# Patient Record
Sex: Female | Born: 1960 | Race: Black or African American | Hispanic: No | Marital: Single | Smoking: Current every day smoker
Health system: Southern US, Community
[De-identification: ages and names within clinical notes are randomized; demographics above are authoritative.]

## PROBLEM LIST (undated history)

## (undated) ENCOUNTER — Emergency Department (HOSPITAL_COMMUNITY): Payer: Medicare HMO

## (undated) DIAGNOSIS — G4730 Sleep apnea, unspecified: Secondary | ICD-10-CM

## (undated) DIAGNOSIS — Z973 Presence of spectacles and contact lenses: Secondary | ICD-10-CM

## (undated) DIAGNOSIS — H409 Unspecified glaucoma: Secondary | ICD-10-CM

## (undated) DIAGNOSIS — E785 Hyperlipidemia, unspecified: Secondary | ICD-10-CM

## (undated) DIAGNOSIS — H269 Unspecified cataract: Secondary | ICD-10-CM

## (undated) DIAGNOSIS — E890 Postprocedural hypothyroidism: Secondary | ICD-10-CM

## (undated) DIAGNOSIS — G4733 Obstructive sleep apnea (adult) (pediatric): Secondary | ICD-10-CM

## (undated) DIAGNOSIS — F419 Anxiety disorder, unspecified: Secondary | ICD-10-CM

## (undated) DIAGNOSIS — D494 Neoplasm of unspecified behavior of bladder: Secondary | ICD-10-CM

## (undated) DIAGNOSIS — M1990 Unspecified osteoarthritis, unspecified site: Secondary | ICD-10-CM

## (undated) DIAGNOSIS — E119 Type 2 diabetes mellitus without complications: Secondary | ICD-10-CM

## (undated) DIAGNOSIS — Z972 Presence of dental prosthetic device (complete) (partial): Secondary | ICD-10-CM

## (undated) DIAGNOSIS — F1911 Other psychoactive substance abuse, in remission: Secondary | ICD-10-CM

## (undated) DIAGNOSIS — C569 Malignant neoplasm of unspecified ovary: Secondary | ICD-10-CM

## (undated) DIAGNOSIS — F333 Major depressive disorder, recurrent, severe with psychotic symptoms: Secondary | ICD-10-CM

## (undated) HISTORY — DX: Sleep apnea, unspecified: G47.30

## (undated) HISTORY — DX: Unspecified cataract: H26.9

## (undated) HISTORY — DX: Anxiety disorder, unspecified: F41.9

## (undated) HISTORY — DX: Unspecified osteoarthritis, unspecified site: M19.90

## (undated) HISTORY — DX: Hyperlipidemia, unspecified: E78.5

## (undated) HISTORY — PX: STRABISMUS SURGERY: SHX218

---

## 2002-03-04 HISTORY — PX: THYROIDECTOMY: SHX17

## 2014-02-16 ENCOUNTER — Emergency Department (HOSPITAL_COMMUNITY): Payer: Medicaid Other

## 2014-02-16 ENCOUNTER — Emergency Department (HOSPITAL_COMMUNITY)
Admission: EM | Admit: 2014-02-16 | Discharge: 2014-02-16 | Disposition: A | Discharge status: Home / Self Care | Payer: Medicaid Other | Attending: Emergency Medicine | Admitting: Emergency Medicine

## 2014-02-16 ENCOUNTER — Encounter (HOSPITAL_COMMUNITY): Payer: Self-pay | Admitting: Family Medicine

## 2014-02-16 DIAGNOSIS — W010XXA Fall on same level from slipping, tripping and stumbling without subsequent striking against object, initial encounter: Secondary | ICD-10-CM | POA: Insufficient documentation

## 2014-02-16 DIAGNOSIS — S8391XA Sprain of unspecified site of right knee, initial encounter: Secondary | ICD-10-CM | POA: Insufficient documentation

## 2014-02-16 DIAGNOSIS — S8991XA Unspecified injury of right lower leg, initial encounter: Secondary | ICD-10-CM | POA: Diagnosis present

## 2014-02-16 DIAGNOSIS — M179 Osteoarthritis of knee, unspecified: Secondary | ICD-10-CM | POA: Insufficient documentation

## 2014-02-16 DIAGNOSIS — S39012A Strain of muscle, fascia and tendon of lower back, initial encounter: Secondary | ICD-10-CM | POA: Insufficient documentation

## 2014-02-16 DIAGNOSIS — M1990 Unspecified osteoarthritis, unspecified site: Secondary | ICD-10-CM

## 2014-02-16 DIAGNOSIS — Y9389 Activity, other specified: Secondary | ICD-10-CM | POA: Insufficient documentation

## 2014-02-16 DIAGNOSIS — Z720 Tobacco use: Secondary | ICD-10-CM | POA: Diagnosis not present

## 2014-02-16 DIAGNOSIS — Y990 Civilian activity done for income or pay: Secondary | ICD-10-CM | POA: Insufficient documentation

## 2014-02-16 DIAGNOSIS — W19XXXA Unspecified fall, initial encounter: Secondary | ICD-10-CM

## 2014-02-16 DIAGNOSIS — Y9289 Other specified places as the place of occurrence of the external cause: Secondary | ICD-10-CM | POA: Diagnosis not present

## 2014-02-16 MED ORDER — HYDROCODONE-ACETAMINOPHEN 5-325 MG PO TABS: ORAL_TABLET | ORAL | Status: DC

## 2014-02-16 NOTE — ED Provider Notes (Signed)
CSN: [id number]     Arrival date & time 02/16/14  1438 History  This chart was scribed for non-physician practitioner, Monico Blitz, PA-C, working with Quintella Reichert, MD, by Delphia Grates, ED Scribe. This patient was seen in room TR07C/TR07C and the patient's care was started at 4:22 PM.   Chief Complaint  Patient presents with  . Fall    The history is provided by the patient. No language interpreter was used.     HPI Comments: Raven [name] is a 53 y.o. female, with no significant medical history, who presents to the Emergency Department complaining of a fall that occurred last night. Patient states she was at work when she slipped ona piece of ice, injuring her right knee and right lower back area. She reports she felt a pop in her right knee. There is associated 7/10 right knee pain and right lower back pain. She states that she is able to ambulate, however, would like X-rays to rule out possible fracture or damage. Patient has taken one 200mg  ibuprofen every 6 hours and has been alternating ice and heat. Pt denies head trauma, LOC, N/V, change in vision, cervicalgia, chest pain, SOB, abdominal pain, difficulty ambulating, numbness, weakness, difficulty moving major joints, EtOH/illicit drug/perscription drug use that would alter awareness.    History reviewed. No pertinent past medical history. History reviewed. No pertinent past surgical history. History reviewed. No pertinent family history. History  Substance Use Topics  . Smoking status: Current Every Day Smoker  . Smokeless tobacco: Not on file  . Alcohol Use: No   OB History    No data available     Review of Systems   A complete 10 system review of systems was obtained and all systems are negative except as noted in the HPI and PMH.     Allergies  Review of patient's allergies indicates no known allergies.  Home Medications   Prior to Admission medications   Medication Sig Start Date End Date Taking?  Authorizing Provider  HYDROcodone-acetaminophen (NORCO/VICODIN) 5-325 MG per tablet Take 1-2 tablets by mouth every 6 hours as needed for pain and/or cough. 02/16/14   Jonita Hirota, PA-C   Triage Vitals: BP 142/70 mmHg  Pulse 87  Temp(Src) 98.3 F (36.8 C) (Oral)  Resp 16  Ht 5\' 8"  (1.727 m)  Wt 260 lb (117.935 kg)  BMI 39.54 kg/m2  SpO2 96%  Physical Exam  Constitutional: She is oriented to person, place, and time. She appears well-developed and well-nourished. No distress.  HENT:  Head: Normocephalic and atraumatic.  Mouth/Throat: Oropharynx is clear and moist.  Eyes: Conjunctivae and EOM are normal. Pupils are equal, round, and reactive to light.  Neck: Normal range of motion. Neck supple. No tracheal deviation present.  No midline C-spine  tenderness to palpation or step-offs appreciated. Patient has full range of motion without pain.   Cardiovascular: Normal rate, regular rhythm and intact distal pulses.   Pulmonary/Chest: Effort normal and breath sounds normal. No respiratory distress. She has no wheezes. She has no rales. She exhibits no tenderness.  Abdominal: Soft.  Musculoskeletal: Normal range of motion.       Back:  Right  knee:  No deformity, erythema or abrasions. FROM. No effusion, ++crepitance. Anterior and posterior drawer show no abnormal laxity. Stable to valgus and varus stress. Joint lines are non-tender. Neurovascularly intact. Pt ambulates with non-antalgic gait.   Right flank with no ecchymoses, lung sounds clear to auscultation, no crepitance. Patient is tender inferior to ribs.  Neurological: She is alert and oriented to person, place, and time.  Skin: Skin is warm and dry.  Psychiatric: She has a normal mood and affect. Her behavior is normal.  Nursing note and vitals reviewed.   ED Course  Procedures (including critical care time)  DIAGNOSTIC STUDIES: Oxygen Saturation is 96% on room air, adequate by my interpretation.    COORDINATION OF  CARE: At 1628 Discussed treatment plan with patient which includes imaging, Vicodin, increased dose of ibuprofen. Patient agrees. Also advised patient to utilize crutches, however patient declines.      Imaging Review Dg Knee Complete 4 Views Right  02/16/2014   CLINICAL DATA:  53 year old female with right anterior knee pain just inferior to the patella for the past 2 days. Recent history of fall.  EXAM: RIGHT KNEE - COMPLETE 4+ VIEW  COMPARISON:  No priors.  FINDINGS: Multiple views of the right knee demonstrate no acute displaced fracture, subluxation, dislocation, or soft tissue abnormality. Joint space narrowing, subchondral sclerosis and osteophyte formation, most evident in the medial and patellofemoral compartments. Mild chondrocalcinosis is noted in the lateral compartment.  IMPRESSION: 1.  No acute radiographic abnormality of the right knee. 2. 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 3. Chondrocalcinosis.   Electronically Signed   By: Vinnie Langton M.D.   On: 02/16/2014 17:00     MDM   Final diagnoses:  Fall  Right knee sprain, initial encounter  Lumbar strain, initial encounter  Arthritis    Filed Vitals:   02/16/14 1456  BP: 142/70  Pulse: 87  Temp: 98.3 F (36.8 C)  TempSrc: Oral  Resp: 16  Height: 5\' 8"  (1.727 m)  Weight: 260 lb (117.935 kg)  SpO2: 96%    Raven [name] is a pleasant 53 y.o. female presenting with right knee and right flank pain status post slip and fall yesterday with no head trauma. No focal bony tenderness palpation along the knee. X-ray shows a severe tricompartmental arthritis with no acute fracture. Offered patient crutches which she declined. Pain medication choices in the ED are limited because she is driving home. I've advised her to follow closely with orthopedics. Written her a work note for several days.  Evaluation does not show pathology that would require ongoing emergent  intervention or inpatient treatment. Pt is hemodynamically stable and mentating appropriately. Discussed findings and plan with patient/guardian, who agrees with care plan. All questions answered. Return precautions discussed and outpatient follow up given.   New Prescriptions   HYDROCODONE-ACETAMINOPHEN (NORCO/VICODIN) 5-325 MG PER TABLET    Take 1-2 tablets by mouth every 6 hours as needed for pain and/or cough.     I personally performed the services described in this documentation, which was scribed in my presence. The recorded information has been reviewed and is accurate.     Monico Blitz, PA-C 02/16/14 [place], MD 02/16/14 2033

## 2014-02-16 NOTE — Discharge Instructions (Signed)
Take vicodin for breakthrough pain, do not drink alcohol, drive, care for children or do other critical tasks while taking vicodin.  Do not hesitate to return to the emergency room for any new, worsening or concerning symptoms.  Please obtain primary care using resource guide below. But the minute you were seen in the emergency room and that they will need to obtain records for further outpatient management.   Emergency Department Resource Guide 1) Find a Doctor and Pay Out of Pocket Although you won't have to find out who is covered by your insurance plan, it is a good idea to ask around and get recommendations. You will then need to call the office and see if the doctor you have chosen will accept you as a new patient and what types of options they offer for patients who are self-pay. Some doctors offer discounts or will set up payment plans for their patients who do not have insurance, but you will need to ask so you aren't surprised when you get to your appointment.  2) Contact Your Local Health Department Not all health departments have doctors that can see patients for sick visits, but many do, so it is worth a call to see if yours does. If you don't know where your local health department is, you can check in your phone book. The CDC also has a tool to help you locate your state's health department, and many state websites also have listings of all of their local health departments.  3) Find a [facility] If your illness is not likely to be very severe or complicated, you may want to try a walk in clinic. These are popping up all over the country in pharmacies, drugstores, and shopping centers. They're usually staffed by nurse practitioners or physician assistants that have been trained to treat common illnesses and complaints. They're usually fairly quick and inexpensive. However, if you have serious medical issues or chronic medical problems, these are probably not your best option.  No  Primary Care Doctor: - Call Health Connect at  [phone] - they can help you locate a primary care doctor that  accepts your insurance, provides certain services, etc. - Physician Referral Service- [phone]  Chronic Pain Problems: Organization         Address  Phone   Notes  [facility]  [phone] Patients need to be referred by their primary care doctor.   Medication Assistance: Organization         Address  Phone   Notes  [facility] Medication [facility] [address] [place], [place] [address] [phone] --Must be a resident of [facility] -- Must have NO insurance coverage whatsoever (no Medicaid/ Medicare, etc.) -- The pt. MUST have a primary care doctor that directs their care regularly and follows them in the community   MedAssist  [phone]   [employer]  [phone]    Agencies that provide inexpensive medical care: Organization         Address  Phone   Notes  [place]  [phone]   [facility]    [phone]   [facility] [place],  [address] [phone]   Norris Kibler [phone]   Planned Parenthood    [phone]   [facility]    [phone]   Community Health and [facility]  [address], [place] Phone:  [phone], Fax:  [phone] Hours of Operation:  9 am - 6 pm, M-F.  Also accepts Medicaid/Medicare and self-pay.  [facility] for [address], [address], [place] Phone: [phone], Fax: [phone]. Hours of Operation:  8:30 am - 5:30 pm, M-F.  Also accepts Medicaid and self-pay.  [facility] [place] [address] [place] Phone: [phone]   [place], [place], [place] [phone], Ext. [phone] Mondays & Thursdays: 7-9 AM.  First 15 patients are seen on  a first come, first serve basis.    [place] Providers:  Organization         Address  Phone   Notes  [facility] [address], [place] [phone] Also accepts self-pay patients.  [facility] ([place]) [address], [place]  [phone]   [place], [address], [place] [phone]   [facility] [address] [place] [phone]   Lucianne Lei [address], [place]   [phone] Only accepts [place] Access [place] patients after they have their name applied to their card.   Self-Pay (no insurance) in [facility]:  Organization         Address  Phone   Notes  Sickle Cell Patients, [facility] Internal Medicine [place] [phone]   [facility] Urgent Care [place] [phone]   [facility] [place]  [place], [place], [place] [phone]   [facility]/Dr. Osei-Bonsu  [address], [place] or [address], [place] [phone] Phone number for both [place] and [place] locations is the same.  Urgent Medical and [facility] [address], [place] [phone]   [facility] [address], [place] or [address] [phone]   [facility] [address], [place] [phone], phone; [phone], fax Sees patients 1st and 3rd Saturday of every month.  Must not qualify for public or private insurance (i.e. Medicaid, Medicare, [place] Health Choice, Veterans' Benefits)  Household income should be no more than 200% of the poverty level The clinic cannot treat you if you are pregnant or think you are pregnant  Sexually transmitted diseases are not treated at the clinic.    Dental Care: Organization          Address  Phone  Notes  [facility] Department of [facility] [place] [phone] Accepts children up to age 63 who are enrolled in [place] or [place]; pregnant women with a Medicaid card; and children who have applied for Medicaid or [place] Health Choice, but were declined, whose parents can pay a reduced fee at time of service.  [facility] Department of [facility]  [address], [place] [phone] Accepts children up to age 72 who are enrolled in [place] or [place]; pregnant women with a Medicaid card; and children who have applied for Medicaid or  Health Choice, but were declined, whose parents can pay a reduced fee at time of service.  [facility] Adult Dental Access PROGRAM  [place], [place] [phone]  Patients are seen by appointment only. Walk-ins are not accepted. [place] will see patients 62 years of age and older. Monday - Tuesday (8am-5pm) Most Wednesdays (8:30-5pm) $30 per visit, cash only  [facility] Adult Dental Access PROGRAM  [address], [facility] [phone] Patients are seen by appointment only. Walk-ins are not accepted. [place] will see patients 76 years of age and older. One Wednesday Evening (Monthly: Volunteer Based).  $30 per visit, cash only  [place]  [phone] for adults; Children under age 48, call Graduate Pediatric Dentistry at [phone]. Children aged 49-14, please call [phone] to request a pediatric application.  Dental services are provided in all areas of dental care including fillings, crowns and bridges, complete and partial dentures, implants, gum treatment, root canals, and extractions. Preventive care is also provided. Treatment is provided to both adults and children. Patients are selected via a lottery and there is often a waiting list.   [facility] [address] [place]  [phone] [email or web address]   [facility] [address] [place], [place] [phone], Ext. [phone] Second and Fourth Thursday of each month, opens at 6:30 AM; Clinic ends at 9 AM.  Patients are seen on a first-come first-served basis, and a limited number are seen during each clinic.   [facility]  [address] Hillard Danker [place], [place] [phone]   Eligibility Requirements You must have lived in [place], [place], or [place] counties for at least the last three months.   You cannot be eligible for state or federal sponsored [employer], including [employer], [place], or [employer].   You generally cannot be eligible for healthcare insurance through your employer.    How to apply: Eligibility screenings are held every Tuesday and Wednesday afternoon from 1:00 pm until 4:00 pm. You do not need an appointment for the interview!  [facility] [address] [place], [place]   [place]  [place] Department  [place]  [phone]    Behavioral Health Resources in the Community: Intensive Outpatient Programs Organization         Address  Phone  Notes  [place]. [address] [place], [place] [phone]   [facility] Outpatient [address], [place], [place]   ADS: Alcohol & Drug Svcs [address] [place], [place]   [place] [address],  [place], [place] or [phone]   Substance Abuse Resources Organization         Address  Phone  Notes  Alcohol and Drug Services  [phone]   [place]  [phone]   [facility] [place]   Chinita Pester  [phone]   Residential & Outpatient Substance Abuse Program  [phone]   Psychological  Services Organization         Address  Phone  Notes  [facility] [place]  [place]  [phone]   [place] [address], [place] or [phone]    Mobile Crisis Teams Organization         Address  Phone  Notes  [facility]atives, Mobile Crisis Care Unit  [phone]   Assertive Psychotherapeutic Services  [address] [place], [place]   [facility] [address], [place]  [place] [phone]    Self-Help/Support Groups Organization         Address  Phone             Notes  [facility] [place] - variety of support groups  [place] Call for more information  Narcotics Anonymous (NA), [facility] [address], [employer] [place]  2 meetings at this location   Special educational needs teacher         Address  Phone  Notes  [facility] Residential Treatment [place],    [place]  1-2486002970   [facility]  [address] [place] [phone], [place], [place]   [place], [place] [phone] Admissions: 8am-3pm M-F  Incentives Substance [place] [address]    [place], [place] [phone]   [facility] [address] [place], [place], [place]   [facility] [place] [facility] [address],  [place], [place]   [facility] - Intensive Outpatient [address], [place], [place]   [facility] ([place] [address].,  [place], [place] [phone] or [phone]   [facility] Services (RTS) [address] [place], [place] Accepts Medicaid  [facility] [place] [address]  [place] [phone] Substance Abuse/Addiction Treatment   [facility] Organization         Address  Phone  Notes  [facility]  [phone]   Domenic Schwab, PhD [address] Arlis Porta [place], [place]   [phone] or [phone]   [facility] [place], [place] [phone]   [facility] [address] [place], [place] [phone] Insurance/Medicaid/sponsorship through [facility] and Families [address] [place]                                    [place], [place] [phone] [place] [address], [place] [phone]    Dr. Adele Schilder  [phone]   [facility] Dept. 1) [address] [place] 2) [place] 3)  [address] [address], [place] [phone]  [phone]   [place] [phone] or [phone] (After Hours)       Arthritis, Nonspecific Arthritis is pain, redness, warmth, or puffiness (inflammation) of a joint. The joint may be stiff or hurt when you move it. One or more joints may be affected. There are many types of arthritis. Your doctor may not know what type you have right away. The most common cause of arthritis is wear and tear on the joint (osteoarthritis). HOME CARE   Only take medicine as told by your doctor.  Rest the joint as much as possible.  Raise (elevate) your joint if it is puffy.  Use crutches if the painful joint is in your leg.  Drink enough fluids to keep your pee (urine) clear or pale yellow.  Follow your doctor's diet instructions.  Use cold packs for very bad joint pain for 10 to 15 minutes every hour. Ask your doctor if it is okay for you to use hot packs.  Exercise as told by your doctor.  Take a  warm shower if you have stiffness in the morning.  Move your sore joints throughout the day. GET HELP RIGHT AWAY IF:   You have a fever.  You have very bad joint pain, puffiness, or redness.  You have many joints that are painful and puffy.  You are not  getting better with treatment.  You have very bad back pain or leg weakness.  You cannot control when you poop (bowel movement) or pee (urinate).  You do not feel better in 24 hours or are getting worse.  You are having side effects from your medicine. MAKE SURE YOU:   Understand these instructions.  Will watch your condition.  Will get help right away if you are not doing well or get worse. Document Released: 05/15/2009 Document Revised: 08/20/2011 Document Reviewed: 05/15/2009 [facility] Patient Information 2015 [place], [place]. This information is not intended to replace advice given to you by your health care provider. Make sure you discuss any questions you have with your health care provider.

## 2014-02-16 NOTE — ED Notes (Signed)
Per pt sts she slippped on a piece of ice at work yesterday and injured right knee and right rib area.

## 2014-02-28 ENCOUNTER — Ambulatory Visit: Payer: Medicaid Other | Admitting: Family Medicine

## 2014-03-03 ENCOUNTER — Ambulatory Visit: Payer: Medicaid Other | Attending: Family Medicine | Admitting: Internal Medicine

## 2014-03-03 ENCOUNTER — Encounter: Payer: Self-pay | Admitting: Internal Medicine

## 2014-03-03 ENCOUNTER — Ambulatory Visit: Payer: Self-pay

## 2014-03-03 ENCOUNTER — Other Ambulatory Visit (HOSPITAL_COMMUNITY)
Admission: RE | Admit: 2014-03-03 | Discharge: 2014-03-03 | Disposition: A | Discharge status: Home / Self Care | Payer: Medicaid Other | Source: Ambulatory Visit | Attending: Family Medicine | Admitting: Family Medicine

## 2014-03-03 ENCOUNTER — Other Ambulatory Visit: Payer: Self-pay | Admitting: Occupational Medicine

## 2014-03-03 VITALS — BP 128/83 | HR 61 | Temp 98.2°F | Resp 16 | Ht 67.0 in | Wt 251.0 lb

## 2014-03-03 DIAGNOSIS — Z113 Encounter for screening for infections with a predominantly sexual mode of transmission: Secondary | ICD-10-CM | POA: Diagnosis present

## 2014-03-03 DIAGNOSIS — Z01419 Encounter for gynecological examination (general) (routine) without abnormal findings: Secondary | ICD-10-CM | POA: Insufficient documentation

## 2014-03-03 DIAGNOSIS — Z0000 Encounter for general adult medical examination without abnormal findings: Secondary | ICD-10-CM | POA: Diagnosis not present

## 2014-03-03 DIAGNOSIS — R52 Pain, unspecified: Secondary | ICD-10-CM

## 2014-03-03 DIAGNOSIS — E119 Type 2 diabetes mellitus without complications: Secondary | ICD-10-CM

## 2014-03-03 DIAGNOSIS — N760 Acute vaginitis: Secondary | ICD-10-CM | POA: Diagnosis present

## 2014-03-03 DIAGNOSIS — Z1211 Encounter for screening for malignant neoplasm of colon: Secondary | ICD-10-CM | POA: Diagnosis not present

## 2014-03-03 DIAGNOSIS — Z1151 Encounter for screening for human papillomavirus (HPV): Secondary | ICD-10-CM | POA: Diagnosis present

## 2014-03-03 DIAGNOSIS — E039 Hypothyroidism, unspecified: Secondary | ICD-10-CM

## 2014-03-03 LAB — POCT URINALYSIS DIPSTICK
Bilirubin, UA: NEGATIVE
Glucose, UA: NEGATIVE
NITRITE UA: POSITIVE
Protein, UA: NEGATIVE
SPEC GRAV UA: 1.025
UROBILINOGEN UA: 1
pH: 5.5

## 2014-03-03 LAB — COMPLETE METABOLIC PANEL WITH GFR
ALT: 19 U/L (ref 0–35)
AST: 22 U/L (ref 0–37)
Albumin: 4.4 g/dL (ref 3.5–5.2)
Alkaline Phosphatase: 97 U/L (ref 39–117)
BUN: 12 mg/dL (ref 6–23)
CALCIUM: 9.4 mg/dL (ref 8.4–10.5)
CO2: 27 mEq/L (ref 19–32)
Chloride: 102 mEq/L (ref 96–112)
Creat: 0.96 mg/dL (ref 0.50–1.10)
GFR, Est African American: 78 mL/min
GFR, Est Non African American: 68 mL/min
GLUCOSE: 114 mg/dL — AB (ref 70–99)
Potassium: 4.6 mEq/L (ref 3.5–5.3)
Sodium: 138 mEq/L (ref 135–145)
Total Bilirubin: 0.4 mg/dL (ref 0.2–1.2)
Total Protein: 7.4 g/dL (ref 6.0–8.3)

## 2014-03-03 LAB — LIPID PANEL
Cholesterol: 230 mg/dL — ABNORMAL HIGH (ref 0–200)
HDL: 43 mg/dL (ref >39)
LDL Cholesterol: 167 mg/dL — ABNORMAL HIGH (ref 0–99)
Total CHOL/HDL Ratio: 5.3 Ratio
Triglycerides: 102 mg/dL (ref <150)
VLDL: 20 mg/dL (ref 0–40)

## 2014-03-03 LAB — CBC
HCT: 42.5 % (ref 36.0–46.0)
HEMOGLOBIN: 14 g/dL (ref 12.0–15.0)
MCH: 28.1 pg (ref 26.0–34.0)
MCHC: 32.9 g/dL (ref 30.0–36.0)
MCV: 85.3 fL (ref 78.0–100.0)
MPV: 9.6 fL (ref 8.6–12.4)
PLATELETS: 296 10*3/uL (ref 150–400)
RBC: 4.98 MIL/uL (ref 3.87–5.11)
RDW: 15.2 % (ref 11.5–15.5)
WBC: 7.3 10*3/uL (ref 4.0–10.5)

## 2014-03-03 LAB — POCT GLYCOSYLATED HEMOGLOBIN (HGB A1C): Hemoglobin A1C: 8.4

## 2014-03-03 LAB — POCT CBG (FASTING - GLUCOSE)-MANUAL ENTRY: Glucose Fasting, POC: 126 mg/dL — AB (ref 70–99)

## 2014-03-03 MED ORDER — FREESTYLE SYSTEM KIT: 1.0000 | PACK | Status: DC | PRN

## 2014-03-03 MED ORDER — METFORMIN HCL 500 MG PO TABS: 1000.0000 mg | ORAL_TABLET | Freq: Two times a day (BID) | ORAL | Status: DC

## 2014-03-03 MED ORDER — GLUCOSE BLOOD VI STRP: ORAL_STRIP | Status: DC

## 2014-03-03 MED ORDER — FREESTYLE LANCETS MISC: Status: DC

## 2014-03-03 MED ORDER — LEVOTHYROXINE SODIUM 200 MCG PO TABS: 200.0000 ug | ORAL_TABLET | Freq: Every day | ORAL | Status: DC

## 2014-03-03 NOTE — Progress Notes (Signed)
Patient ID: Raven [name], female   DOB: 09/15/1960, 53 y.o.   MRN: [id number]  [id number]:[id number]  [id number]  DOB - 1960-05-14  CC:  Chief Complaint  Patient presents with  . Establish Care       HPI: Raven [name] is a 53 y.o. female here today to establish medical care. Patient has a past medical history of depression, T2DM, HLD, glaucoma, and thyroid disease.  She reports that she moved here from [place].  She reports that she does not check her sugar because she does not know where her machine is at.  She reportedly takes byetta 46mg and metformin 500 mg BID.  Reports blurred vision and polyuria.  She has pain in her right lower back, right knee, and abdomen.  Fell at work and hurt knee/back 12/15, she is receiving workers comp.    Reports foul smelling urine. Has a vaginal bump on labia.  Denies vaginal discharge, itch, or odor. Last mammogram in September.  Need Colonoscopy.  Last pap smear was a couple of years ago. Menstrual cycle last week and reports bleeding only when stressed.     No Known Allergies Past Medical History  Diagnosis Date  . Depression   . Diabetes mellitus without complication   . Anxiety   . Arthritis   . Hyperlipidemia   . Thyroid disease    Current Outpatient Prescriptions on File Prior to Visit  Medication Sig Dispense Refill  . HYDROcodone-acetaminophen (NORCO/VICODIN) 5-325 MG per tablet Take 1-2 tablets by mouth every 6 hours as needed for pain and/or cough. (Patient not taking: Reported on 03/03/2014) 11 tablet 0   No current facility-administered medications on file prior to visit.   Family History  Problem Relation Age of Onset  . Diabetes Mother   . Stroke Mother   . Heart disease Mother   . Cancer Sister    History   Social History  . Marital Status: Single    Spouse Name: N/A    Number of Children: N/A  . Years of Education: N/A   Occupational History  . Not on file.   Social History Main Topics  . Smoking status: Current  Every Day Smoker -- 0.25 packs/day for 3 years    Types: Cigarettes  . Smokeless tobacco: Not on file  . Alcohol Use: No  . Drug Use: No  . Sexual Activity: No   Other Topics Concern  . Not on file   Social History Narrative    Review of Systems  Constitutional: Negative.   Eyes: Positive for blurred vision.  Respiratory: Negative.   Cardiovascular: Negative.   Gastrointestinal: Positive for heartburn, abdominal pain and constipation. Negative for nausea and vomiting.  Genitourinary: Negative.   Musculoskeletal: Negative.   Skin: Negative.   Neurological: Negative.  Negative for headaches.  Endo/Heme/Allergies: Positive for polydipsia.  Psychiatric/Behavioral: Positive for depression. Negative for substance abuse.      Objective:   Filed Vitals:   03/03/14 1034  BP: 128/83  Pulse: 61  Temp: 98.2 F (36.8 C)  Resp: 16    Physical Exam: Constitutional: Patient appears well-developed and well-nourished. No distress. HENT: Normocephalic, atraumatic, External right and left ear normal. Oropharynx is clear and moist.  Eyes: Conjunctivae and EOM are normal. PERRLA, no scleral icterus. Neck: Normal ROM. Neck supple. No JVD. No tracheal deviation. No thyromegaly. CVS: RRR, S1/S2 +, no murmurs, no gallops, no carotid bruit.  Pulmonary: Effort and breath sounds normal, no stridor, rhonchi, wheezes, rales.  Abdominal: Soft. BS +,  no distension, tenderness, rebound or guarding.  Musculoskeletal: Normal range of motion. No edema and no tenderness.  Lymphadenopathy: No lymphadenopathy noted, cervical, Neuro: Alert. Normal reflexes, muscle tone coordination. No cranial nerve deficit. Skin: Skin is warm and dry. No rash noted. Not diaphoretic. No erythema. No pallor. Psychiatric: Normal mood and affect. Behavior, judgment, thought content normal.  No results found for: WBC, HGB, HCT, MCV, PLT No results found for: CREATININE, BUN, NA, K, CL, CO2  Lab Results  Component Value  Date   HGBA1C 8.4 03/03/2014   Lipid Panel  No results found for: CHOL, TRIG, HDL, CHOLHDL, VLDL, LDLCALC     Assessment and plan:   [name] was seen today for establish care.  Diagnoses and associated orders for this visit:  Type 2 diabetes mellitus without complication - Glucose (CBG), Fasting - HgB A1c - metFORMIN (GLUCOPHAGE) 500 MG tablet; Take 2 tablets (1,000 mg total) by mouth 2 (two) times daily with a meal. - Microalbumin, urine - glucose monitoring kit (FREESTYLE) monitoring kit; 1 each by Does not apply route as needed. Check BS three times per day for E11.9 - glucose blood test strip; Check BS three times per day for E11.9 - Lancets (FREESTYLE) lancets; Check BS three times per day for E11.9 Patients diabetes is well control as evidence by low a1c.  Patient will continue with current therapy and continue to make necessary lifestyle changes.  Reviewed foot care, diet, exercise, annual health maintenance with patient.   Hypothyroidism, unspecified hypothyroidism type - Refill levothyroxine (SYNTHROID, LEVOTHROID) 200 MCG tablet; Take 1 tablet (200 mcg total) by mouth daily before breakfast. Begin back on medication and we will recheck levels in 6-8 weeks - TSH; Future - T4, Free; Future  Annual physical exam - POCT urinalysis dipstick - Lipid panel - CBC - COMPLETE METABOLIC PANEL WITH GFR - Cervicovaginal ancillary only - Cytology - PAP - HIV antibody (with reflex) - RPR  Colon cancer screening - [facility] COLONOSCOPY     Return in about 3 months (around 06/02/2014) for Diabetes Mellitus.     Raven [name], [facility] [phone] 03/03/2014, 11:17 AM

## 2014-03-03 NOTE — Progress Notes (Signed)
Patient here to establish care and to follow up on type 2 DM Patient does not have a cbg machine She is a smoker but is interested in quitting Patient does not need  fills on medications but needs glucose monitor and insulin Patient thinks she has yeast infection and would like pap and std testing Patient refused flu shot

## 2014-03-03 NOTE — Patient Instructions (Signed)
Health Maintenance Adopting a healthy lifestyle and getting preventive care can go a [place] way to promote health and wellness. Talk with your health care provider about what schedule of regular examinations is right for you. This is a good chance for you to check in with your provider about disease prevention and staying healthy. In between checkups, there are plenty of things you can do on your own. Experts have done a lot of research about which lifestyle changes and preventive measures are most likely to keep you healthy. Ask your health care provider for more information. WEIGHT AND DIET  Eat a healthy diet  Be sure to include plenty of vegetables, fruits, low-fat dairy products, and lean protein.  Do not eat a lot of foods high in solid fats, added sugars, or salt.  Get regular exercise. This is one of the most important things you can do for your health.  Most adults should exercise for at least 150 minutes each week. The exercise should increase your heart rate and make you sweat (moderate-intensity exercise).  Most adults should also do strengthening exercises at least twice a week. This is in addition to the moderate-intensity exercise.  Maintain a healthy weight  Body mass index (BMI) is a measurement that can be used to identify possible weight problems. It estimates body fat based on height and weight. Your health care provider can help determine your BMI and help you achieve or maintain a healthy weight.  For females 25 years of age and older:   A BMI below 18.5 is considered underweight.  A BMI of 18.5 to 24.9 is normal.  A BMI of 25 to 29.9 is considered overweight.  A BMI of 30 and above is considered obese.  Watch levels of cholesterol and blood lipids  You should start having your blood tested for lipids and cholesterol at 53 years of age, then have this test every 5 years.  You may need to have your cholesterol levels checked more often if:  Your lipid or  cholesterol levels are high.  You are older than 53 years of age.  You are at high risk for heart disease.  CANCER SCREENING   Lung Cancer  Lung cancer screening is recommended for adults 97-92 years old who are at high risk for lung cancer because of a history of smoking.  A yearly low-dose CT scan of the lungs is recommended for people who:  Currently smoke.  Have quit within the past 15 years.  Have at least a 30-pack-year history of smoking. A pack year is smoking an average of one pack of cigarettes a day for 1 year.  Yearly screening should continue until it has been 15 years since you quit.  Yearly screening should stop if you develop a health problem that would prevent you from having lung cancer treatment.  Breast Cancer  Practice breast self-awareness. This means understanding how your breasts normally appear and feel.  It also means doing regular breast self-exams. Let your health care provider know about any changes, no matter how small.  If you are in your 20s or 30s, you should have a clinical breast exam (CBE) by a health care provider every 1-3 years as part of a regular health exam.  If you are 76 or older, have a CBE every year. Also consider having a breast X-ray (mammogram) every year.  If you have a family history of breast cancer, talk to your health care provider about genetic screening.  If you are  at high risk for breast cancer, talk to your health care provider about having an MRI and a mammogram every year.  Breast cancer gene (BRCA) assessment is recommended for women who have family members with BRCA-related cancers. BRCA-related cancers include:  Breast.  Ovarian.  Tubal.  Peritoneal cancers.  Results of the assessment will determine the need for genetic counseling and BRCA1 and BRCA2 testing. Cervical Cancer Routine pelvic examinations to screen for cervical cancer are no longer recommended for nonpregnant women who are considered low  risk for cancer of the pelvic organs (ovaries, uterus, and vagina) and who do not have symptoms. A pelvic examination may be necessary if you have symptoms including those associated with pelvic infections. Ask your health care provider if a screening pelvic exam is right for you.   The Pap test is the screening test for cervical cancer for women who are considered at risk.  If you had a hysterectomy for a problem that was not cancer or a condition that could lead to cancer, then you no longer need Pap tests.  If you are older than 65 years, and you have had normal Pap tests for the past 10 years, you no longer need to have Pap tests.  If you have had past treatment for cervical cancer or a condition that could lead to cancer, you need Pap tests and screening for cancer for at least 20 years after your treatment.  If you no longer get a Pap test, assess your risk factors if they change (such as having a new sexual partner). This can affect whether you should start being screened again.  Some women have medical problems that increase their chance of getting cervical cancer. If this is the case for you, your health care provider may recommend more frequent screening and Pap tests.  The human papillomavirus (HPV) test is another test that may be used for cervical cancer screening. The HPV test looks for the virus that can cause cell changes in the cervix. The cells collected during the Pap test can be tested for HPV.  The HPV test can be used to screen women 30 years of age and older. Getting tested for HPV can extend the interval between normal Pap tests from three to five years.  An HPV test also should be used to screen women of any age who have unclear Pap test results.  After 53 years of age, women should have HPV testing as often as Pap tests.  Colorectal Cancer  This type of cancer can be detected and often prevented.  Routine colorectal cancer screening usually begins at 53 years of  age and continues through 53 years of age.  Your health care provider may recommend screening at an earlier age if you have risk factors for colon cancer.  Your health care provider may also recommend using home test kits to check for hidden blood in the stool.  A small camera at the end of a tube can be used to examine your colon directly (sigmoidoscopy or colonoscopy). This is done to check for the earliest forms of colorectal cancer.  Routine screening usually begins at age 50.  Direct examination of the colon should be repeated every 5-10 years through 53 years of age. However, you may need to be screened more often if early forms of precancerous polyps or small growths are found. Skin Cancer  Check your skin from head to toe regularly.  Tell your health care provider about any new moles or changes in   moles, especially if there is a change in a mole's shape or color.  Also tell your health care provider if you have a mole that is larger than the size of a pencil eraser.  Always use sunscreen. Apply sunscreen liberally and repeatedly throughout the day.  Protect yourself by wearing [place] sleeves, pants, a wide-brimmed hat, and sunglasses whenever you are outside. HEART DISEASE, DIABETES, AND HIGH BLOOD PRESSURE   Have your blood pressure checked at least every 1-2 years. High blood pressure causes heart disease and increases the risk of stroke.  If you are between 75 years and 42 years old, ask your health care provider if you should take aspirin to prevent strokes.  Have regular diabetes screenings. This involves taking a blood sample to check your fasting blood sugar level.  If you are at a normal weight and have a low risk for diabetes, have this test once every three years after 53 years of age.  If you are overweight and have a high risk for diabetes, consider being tested at a younger age or more often. PREVENTING INFECTION  Hepatitis B  If you have a higher risk for  hepatitis B, you should be screened for this virus. You are considered at high risk for hepatitis B if:  You were born in a country where hepatitis B is common. Ask your health care provider which countries are considered high risk.  Your parents were born in a high-risk country, and you have not been immunized against hepatitis B (hepatitis B vaccine).  You have HIV or AIDS.  You use needles to inject street drugs.  You live with someone who has hepatitis B.  You have had sex with someone who has hepatitis B.  You get hemodialysis treatment.  You take certain medicines for conditions, including cancer, organ transplantation, and autoimmune conditions. Hepatitis C  Blood testing is recommended for:  Everyone born from [birth year] through 1965.  Anyone with known risk factors for hepatitis C. Sexually transmitted infections (STIs)  You should be screened for sexually transmitted infections (STIs) including gonorrhea and chlamydia if:  You are sexually active and are younger than 53 years of age.  You are older than 53 years of age and your health care provider tells you that you are at risk for this type of infection.  Your sexual activity has changed since you were last screened and you are at an increased risk for chlamydia or gonorrhea. Ask your health care provider if you are at risk.  If you do not have HIV, but are at risk, it may be recommended that you take a prescription medicine daily to prevent HIV infection. This is called pre-exposure prophylaxis (PrEP). You are considered at risk if:  You are sexually active and do not regularly use condoms or know the HIV status of your partner(s).  You take drugs by injection.  You are sexually active with a partner who has HIV. Talk with your health care provider about whether you are at high risk of being infected with HIV. If you choose to begin PrEP, you should first be tested for HIV. You should then be tested every 3 months for  as [place] as you are taking PrEP.  PREGNANCY   If you are premenopausal and you may become pregnant, ask your health care provider about preconception counseling.  If you may become pregnant, take 400 to 800 micrograms (mcg) of folic acid every day.  If you want to prevent pregnancy, talk to your  health care provider about birth control (contraception). OSTEOPOROSIS AND MENOPAUSE   Osteoporosis is a disease in which the bones lose minerals and strength with aging. This can result in serious bone fractures. Your risk for osteoporosis can be identified using a bone density scan.  If you are 43 years of age or older, or if you are at risk for osteoporosis and fractures, ask your health care provider if you should be screened.  Ask your health care provider whether you should take a calcium or vitamin D supplement to lower your risk for osteoporosis.  Menopause may have certain physical symptoms and risks.  Hormone replacement therapy may reduce some of these symptoms and risks. Talk to your health care provider about whether hormone replacement therapy is right for you.  HOME CARE INSTRUCTIONS   Schedule regular health, dental, and eye exams.  Stay current with your immunizations.   Do not use any tobacco products including cigarettes, chewing tobacco, or electronic cigarettes.  If you are pregnant, do not drink alcohol.  If you are breastfeeding, limit how much and how often you drink alcohol.  Limit alcohol intake to no more than 1 drink per day for nonpregnant women. One drink equals 12 ounces of beer, 5 ounces of wine, or 1 ounces of hard liquor.  Do not use street drugs.  Do not share needles.  Ask your health care provider for help if you need support or information about quitting drugs.  Tell your health care provider if you often feel depressed.  Tell your health care provider if you have ever been abused or do not feel safe at home. Document Released: 09/03/2010  Document Revised: 07/05/2013 Document Reviewed: 01/20/2013 [facility] Patient Information 2015 [place], [place]. This information is not intended to replace advice given to you by your health care provider. Make sure you discuss any questions you have with your health care provider.

## 2014-03-04 LAB — MICROALBUMIN, URINE: MICROALB UR: 1.4 mg/dL (ref <2.0)

## 2014-03-04 LAB — RPR

## 2014-03-04 LAB — HIV ANTIBODY (ROUTINE TESTING W REFLEX): HIV: NONREACTIVE

## 2014-03-08 LAB — CERVICOVAGINAL ANCILLARY ONLY
Chlamydia: NEGATIVE
NEISSERIA GONORRHEA: NEGATIVE
WET PREP (BD AFFIRM): NEGATIVE
Wet Prep (BD Affirm): NEGATIVE
Wet Prep (BD Affirm): NEGATIVE

## 2014-03-09 LAB — CYTOLOGY - PAP

## 2014-03-23 ENCOUNTER — Telehealth: Payer: Self-pay | Admitting: *Deleted

## 2014-03-23 ENCOUNTER — Other Ambulatory Visit: Payer: Self-pay | Admitting: *Deleted

## 2014-03-23 MED ORDER — SIMVASTATIN 40 MG PO TABS: 40.0000 mg | ORAL_TABLET | Freq: Every day | ORAL | Status: DC

## 2014-03-23 NOTE — Telephone Encounter (Signed)
-----   Message from Lance Bosch, NP sent at 03/22/2014 11:25 PM EST ----- Patient pap is negative for malignancies and infections. Will repeat in 3 years.  Find out if patient is taking cholesterol medication (simvastatin)? If not she needs to start back asap, her cholesterol is elevated. Please educate appropriately and explain risk of stroke, cvd

## 2014-03-23 NOTE — Telephone Encounter (Signed)
Pt aware of lab results Rx Simvastatin refills send to [facility]

## 2014-03-31 ENCOUNTER — Other Ambulatory Visit: Payer: Self-pay | Admitting: Family Medicine

## 2014-03-31 DIAGNOSIS — E039 Hypothyroidism, unspecified: Secondary | ICD-10-CM

## 2014-03-31 MED ORDER — LEVOTHYROXINE SODIUM 200 MCG PO TABS: 200.0000 ug | ORAL_TABLET | Freq: Every day | ORAL | Status: DC

## 2014-03-31 MED ORDER — OMEPRAZOLE 20 MG PO CPDR: 20.0000 mg | DELAYED_RELEASE_CAPSULE | Freq: Every day | ORAL | Status: DC

## 2014-03-31 NOTE — Telephone Encounter (Signed)
Rx refills send to [facility] Pt aware

## 2014-03-31 NOTE — Telephone Encounter (Signed)
[facility] nurse called in for a medication refill. Please follow up with pt.

## 2014-05-13 DIAGNOSIS — F329 Major depressive disorder, single episode, unspecified: Secondary | ICD-10-CM | POA: Insufficient documentation

## 2014-05-13 DIAGNOSIS — F419 Anxiety disorder, unspecified: Secondary | ICD-10-CM | POA: Insufficient documentation

## 2014-05-13 DIAGNOSIS — H4010X0 Unspecified open-angle glaucoma, stage unspecified: Secondary | ICD-10-CM | POA: Insufficient documentation

## 2014-05-31 ENCOUNTER — Encounter: Payer: Self-pay | Admitting: Gastroenterology

## 2014-06-16 ENCOUNTER — Encounter (HOSPITAL_COMMUNITY): Payer: Self-pay

## 2014-06-16 ENCOUNTER — Emergency Department (HOSPITAL_COMMUNITY)
Admission: EM | Admit: 2014-06-16 | Discharge: 2014-06-16 | Disposition: A | Discharge status: Home / Self Care | Payer: Medicaid Other | Attending: Emergency Medicine | Admitting: Emergency Medicine

## 2014-06-16 ENCOUNTER — Emergency Department (HOSPITAL_COMMUNITY): Payer: Medicaid Other

## 2014-06-16 DIAGNOSIS — E785 Hyperlipidemia, unspecified: Secondary | ICD-10-CM | POA: Insufficient documentation

## 2014-06-16 DIAGNOSIS — E119 Type 2 diabetes mellitus without complications: Secondary | ICD-10-CM | POA: Diagnosis not present

## 2014-06-16 DIAGNOSIS — E079 Disorder of thyroid, unspecified: Secondary | ICD-10-CM | POA: Insufficient documentation

## 2014-06-16 DIAGNOSIS — R52 Pain, unspecified: Secondary | ICD-10-CM

## 2014-06-16 DIAGNOSIS — M1712 Unilateral primary osteoarthritis, left knee: Secondary | ICD-10-CM | POA: Diagnosis not present

## 2014-06-16 DIAGNOSIS — F329 Major depressive disorder, single episode, unspecified: Secondary | ICD-10-CM | POA: Insufficient documentation

## 2014-06-16 DIAGNOSIS — M1990 Unspecified osteoarthritis, unspecified site: Secondary | ICD-10-CM | POA: Insufficient documentation

## 2014-06-16 DIAGNOSIS — F419 Anxiety disorder, unspecified: Secondary | ICD-10-CM | POA: Diagnosis not present

## 2014-06-16 DIAGNOSIS — Y998 Other external cause status: Secondary | ICD-10-CM | POA: Diagnosis not present

## 2014-06-16 DIAGNOSIS — Z79899 Other long term (current) drug therapy: Secondary | ICD-10-CM | POA: Insufficient documentation

## 2014-06-16 DIAGNOSIS — S8992XA Unspecified injury of left lower leg, initial encounter: Secondary | ICD-10-CM | POA: Diagnosis present

## 2014-06-16 DIAGNOSIS — Y9289 Other specified places as the place of occurrence of the external cause: Secondary | ICD-10-CM | POA: Insufficient documentation

## 2014-06-16 DIAGNOSIS — Z7982 Long term (current) use of aspirin: Secondary | ICD-10-CM | POA: Diagnosis not present

## 2014-06-16 DIAGNOSIS — Y9389 Activity, other specified: Secondary | ICD-10-CM | POA: Diagnosis not present

## 2014-06-16 DIAGNOSIS — W1830XA Fall on same level, unspecified, initial encounter: Secondary | ICD-10-CM | POA: Diagnosis not present

## 2014-06-16 DIAGNOSIS — Z720 Tobacco use: Secondary | ICD-10-CM | POA: Diagnosis not present

## 2014-06-16 MED ORDER — IBUPROFEN 600 MG PO TABS: 600.0000 mg | ORAL_TABLET | Freq: Four times a day (QID) | ORAL | Status: DC | PRN

## 2014-06-16 MED ORDER — TRAMADOL HCL 50 MG PO TABS: 50.0000 mg | ORAL_TABLET | Freq: Four times a day (QID) | ORAL | Status: DC | PRN

## 2014-06-16 NOTE — ED Provider Notes (Signed)
CSN: [id number]     Arrival date & time 06/16/14  0016 History   First MD Initiated Contact with Patient 06/16/14 0050     Chief Complaint  Patient presents with  . Knee Pain     (Consider location/radiation/quality/duration/timing/severity/associated sxs/prior Treatment) Patient is a 54 y.o. female presenting with knee pain. The history is provided by the patient.  Knee Pain Location:  Knee Time since incident:  14 days Injury: yes   Mechanism of injury: fall   Fall:    Fall occurred:  Walking Knee location:  L knee Pain details:    Quality:  Aching   Radiates to:  Does not radiate   Severity:  Moderate   Onset quality:  Sudden   Duration:  14 days   Timing:  Constant   Progression:  Unchanged Chronicity:  New Dislocation: no   Prior injury to area:  No Relieved by:  Nothing Worsened by:  Activity Ineffective treatments:  Elevation, heat, ice, immobilization, movement and rest Associated symptoms: swelling   Associated symptoms: no fever     Past Medical History  Diagnosis Date  . Depression   . Diabetes mellitus without complication   . Anxiety   . Arthritis   . Hyperlipidemia   . Thyroid disease    Past Surgical History  Procedure Laterality Date  . Thyroidectomy     Family History  Problem Relation Age of Onset  . Diabetes Mother   . Stroke Mother   . Heart disease Mother   . Cancer Sister    History  Substance Use Topics  . Smoking status: Current Every Day Smoker -- 0.25 packs/day for 3 years    Types: Cigarettes  . Smokeless tobacco: Not on file  . Alcohol Use: No   OB History    No data available     Review of Systems  Constitutional: Negative for fever.  Respiratory: Negative for shortness of breath.   Cardiovascular: Negative for leg swelling.  Musculoskeletal: Positive for joint swelling.  Skin: Negative for wound.  All other systems reviewed and are negative.     Allergies  Review of patient's allergies indicates no known  allergies.  Home Medications   Prior to Admission medications   Medication Sig Start Date End Date Taking? Authorizing Provider  ALPHAGAN P 0.1 % SOLN Place 1 drop into both eyes 3 (three) times daily. 05/13/14  Yes Historical Provider, MD  aspirin 81 MG chewable tablet Chew 81 mg by mouth every morning.   Yes Historical Provider, MD  bimatoprost (LATISSE) 0.03 % ophthalmic solution Place into both eyes at bedtime. Place one drop on applicator and apply evenly along the skin of the upper eyelid at base of eyelashes once daily at bedtime; repeat procedure for second eye (use a clean applicator).   Yes Historical Provider, MD  escitalopram (LEXAPRO) 20 MG tablet Take 20 mg by mouth daily.   Yes Historical Provider, MD  INVOKANA 300 MG TABS tablet Take 1 tablet by mouth daily. 05/13/14  Yes Historical Provider, MD  levothyroxine (SYNTHROID, LEVOTHROID) 175 MCG tablet Take 1 tablet by mouth every evening. 05/14/14  Yes Historical Provider, MD  metFORMIN (GLUCOPHAGE-XR) 500 MG 24 hr tablet Take 4 tablets by mouth every evening. 05/13/14  Yes Historical Provider, MD  omeprazole (PRILOSEC) 20 MG capsule Take 1 capsule (20 mg total) by mouth daily. 03/31/14  Yes Lance Bosch, NP  simvastatin (ZOCOR) 40 MG tablet Take 1 tablet (40 mg total) by mouth daily. 03/23/14  Yes Boykin Nearing, MD  glucose blood test strip Check BS three times per day for E11.9 03/03/14   Lance Bosch, NP  glucose monitoring kit (FREESTYLE) monitoring kit 1 each by Does not apply route as needed. Check BS three times per day for E11.9 03/03/14   Lance Bosch, NP  HYDROcodone-acetaminophen (NORCO/VICODIN) 5-325 MG per tablet Take 1-2 tablets by mouth every 6 hours as needed for pain and/or cough. Patient not taking: Reported on 03/03/2014 02/16/14   Elmyra Ricks Pisciotta, PA-C  ibuprofen (ADVIL,MOTRIN) 600 MG tablet Take 1 tablet (600 mg total) by mouth every 6 (six) hours as needed. 06/16/14   Junius Creamer, NP  Lancets (FREESTYLE)  lancets Check BS three times per day for E11.9 03/03/14   Lance Bosch, NP  levothyroxine (SYNTHROID, LEVOTHROID) 200 MCG tablet Take 1 tablet (200 mcg total) by mouth daily before breakfast. Patient not taking: Reported on 06/16/2014 03/31/14   Lance Bosch, NP  metFORMIN (GLUCOPHAGE) 500 MG tablet Take 2 tablets (1,000 mg total) by mouth 2 (two) times daily with a meal. Patient not taking: Reported on 06/16/2014 03/03/14   Lance Bosch, NP  traMADol (ULTRAM) 50 MG tablet Take 1 tablet (50 mg total) by mouth every 6 (six) hours as needed. 06/16/14   Junius Creamer, NP   BP 148/82 mmHg  Pulse 103  Temp(Src) 98.1 F (36.7 C) (Oral)  Resp 20  Ht '5\' 7"'  (1.702 m)  Wt 237 lb (107.502 kg)  BMI 37.11 kg/m2  SpO2 96% Physical Exam  Constitutional: She appears well-developed and well-nourished.  HENT:  Head: Normocephalic.  Eyes: Pupils are equal, round, and reactive to light.  Neck: Normal range of motion.  Cardiovascular: Normal rate.   Pulmonary/Chest: Effort normal.  Abdominal: Soft.  Musculoskeletal: She exhibits tenderness. She exhibits no edema.       Left knee: She exhibits decreased range of motion and swelling. She exhibits no effusion, no ecchymosis, no deformity, no laceration, no erythema and normal alignment. Tenderness found.  Neurological: She is alert.  Skin: Skin is warm and dry. No erythema.  Nursing note and vitals reviewed.   ED Course  Procedures (including critical care time) Labs Review Labs Reviewed - No data to display  Imaging Review Dg Knee Complete 4 Views Left  06/16/2014   CLINICAL DATA:  Left knee pain. Slipped on a rug at her home April 1, 13 days prior. Twisting injury, increasing pain and swelling since that time. Now unable to bear weight.  EXAM: LEFT KNEE - COMPLETE 4+ VIEW  COMPARISON:  None.  FINDINGS: No fracture or dislocation. There is moderate medial tibial femoral joint space narrowing and peripheral osteophytes. Small inferior and superior  patellar spurs. Minimal joint effusion. No focal soft tissue abnormality. Quadriceps and to a lesser extent patellar tendon enthesophytes are noted.  IMPRESSION: 1. No acute fracture or dislocation. 2. Moderate medial tibial femoral osteoarthritis.   Electronically Signed   By: Jeb Levering M.D.   On: 06/16/2014 03:18     EKG Interpretation None     patient.  X-ray show she has osteoarthritis of knee.  She been placed in a knee sleeve given Ultram and ibuprofen to take on a regular basis and instructions to follow-up with her primary care physician  MDM   Final diagnoses:  Primary osteoarthritis of knee, left         Junius Creamer, NP 06/16/14 0350  April Palumbo, MD 06/16/14 0355

## 2014-06-16 NOTE — ED Notes (Signed)
Pt given ice water.

## 2014-06-16 NOTE — ED Notes (Signed)
Pt fell on April 1st and injured her knee, she has been trying to manage at home and the pain is not getting any better

## 2014-06-16 NOTE — Discharge Instructions (Signed)
The x-ray shows that you have osteoarthritis of your knee even placed in a knee sleeve for comfort as well as given medication that you can use on a regular basis for pain, swelling and inflammation Please make an appointment with your primary care physician for further follow-up

## 2014-06-17 ENCOUNTER — Telehealth (HOSPITAL_COMMUNITY): Payer: Self-pay | Admitting: *Deleted

## 2014-06-17 ENCOUNTER — Encounter (HOSPITAL_COMMUNITY): Payer: Self-pay | Admitting: *Deleted

## 2014-06-17 ENCOUNTER — Ambulatory Visit (INDEPENDENT_AMBULATORY_CARE_PROVIDER_SITE_OTHER): Payer: Medicaid Other | Admitting: Psychiatry

## 2014-06-17 VITALS — BP 132/78 | HR 80 | Ht 68.0 in | Wt 234.0 lb

## 2014-06-17 DIAGNOSIS — F332 Major depressive disorder, recurrent severe without psychotic features: Secondary | ICD-10-CM | POA: Diagnosis not present

## 2014-06-17 MED ORDER — TEMAZEPAM 15 MG PO CAPS: ORAL_CAPSULE | ORAL | Status: DC

## 2014-06-17 MED ORDER — BUPROPION HCL ER (XL) 150 MG PO TB24: ORAL_TABLET | ORAL | Status: DC

## 2014-06-17 NOTE — Telephone Encounter (Signed)
Called patient back after she left a message she was unable to fill her prescribed Restoril today due to a needed prior authorization for the medication.  Informed the prior authorization was completed by Hope Pigeon, CMA and Dr.Plovsky but could take 24-48 hours before a decision was rendered.  Patient stated understanding, will try to fill medication each date this weekend and call back on Monday 06/20/14 if no news.  Patient stated okay with waiting for this process to go through.

## 2014-06-17 NOTE — Progress Notes (Signed)
Psychiatric Assessment Adult  Patient Identification:  Raven [name] Date of Evaluation:  06/17/2014 Chief Complaint: Feels horrible This patient is a 54 year old African-American female mother of 4 who was asked to be seen because of persistent daily depression since October. Was in October when she moved her mother into a nursing home. Recently her mother is been hospitalized and likely will have her leg amputated. The patient is very distressed about her mother and how she is. The patient also has a 37 year old son is been acting out and presently is receiving psychiatric care at [facility] receiving Risperdal and other medications and recently has been accepted into in-home therapy for his care. 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 Unfortunately there were complications and she lost her job. The patient also has chronic knee pain which is apparently a new problem. This patient has a distant history of crack cocaine use up until 2003. She says she is free of crack cocaine and was going to NA groups but stopped going. At this time the patient acknowledges that she's been persistently depressed since October with associated anxiety and a reduction in her appetite. She claims she's lost weight. She has problems initiating her sleep but she attributes to her knee pain. The patient has no energy. The patient feels worthless. The patient denies being suicidal and has never made a suicide attempt. She gives some vague descriptions of auditory hallucinations but I do not think there genuine. She essentially hears something when she gets in the shower and sees some visions laded night when she opens arise. I do not believe these are true hallucinations. At this time the patient denies the use of alcohol or drugs. There is no clear evidence of hallucinations. The patient denies any symptoms consistent with mania ever in her life. She denies a  symptom cluster consistent with generalized anxiety disorder panic disorder or obsessive-compulsive disorder. These days the patient denies shortness of breath or chest pain. She has no history of head trauma. The patient denies ever being in a psychiatric hospital. She is vague and unclear a poor historian about her past psychiatric care. I think there has been some therapies she can describe it. The patient says she's been on Lexapro but at this time she is run out of it. She is unclear to conclude Lexapro made any difference. The patient clearly states that she has great difficulty getting out of bed. She admits that she's not functioning well at all. Her intent is to kind a new job but at this time she has to work hard even to come to this visit today. At this time the patient is in no relationships with anyone else mainly spends her time caring for her 53 year old son and her mother who is hospitalized. History of Chief Complaint:  No chief complaint on file.   HPI Review of Systems Physical Exam  Depressive Symptoms: depressed mood,  (Hypo) Manic Symptoms:   Elevated Mood:  No Irritable Mood:  No Grandiosity:  No Distractibility:  Yes Labiality of Mood:  No Delusions:  No Hallucinations:  No Impulsivity:  No Sexually Inappropriate Behavior:  No Financial Extravagance:  No Flight of Ideas:  No  Anxiety Symptoms: Excessive Worry:  Yes Panic Symptoms:  No Agoraphobia:  No Obsessive Compulsive: No  Symptoms: None, Specific Phobias:  No Social Anxiety:  No  Psychotic Symptoms:  Hallucinations: No None Delusions:  Yes Parano  no  Ideas of Reference:  No  PTSD Symptoms: Ever had a traumatic exposure:  No Had a traumatic exposure in the last month:  No Re-experiencing:   Hypervigilance:   Hyperarousal:   Avoidance:    Traumatic Brain Injury: No   Past Psychiatric History: Diagnosis: not clear  Hospitalizations: never  Outpatient Care:   Substance Abuse Care:    Self-Mutilation:   Suicidal Attempts: none  Violent Behaviors:    Past Medical History:   Past Medical History  Diagnosis Date  . Depression   . Diabetes mellitus without complication   . Anxiety   . Arthritis   . Hyperlipidemia   . Thyroid disease    History of Loss of Consciousness:  No Seizure History:  No Cardiac History:  No Allergies:  No Known Allergies Current Medications:  Current Outpatient Prescriptions  Medication Sig Dispense Refill  . ALPHAGAN P 0.1 % SOLN Place 1 drop into both eyes 3 (three) times daily.  0  . aspirin 81 MG chewable tablet Chew 81 mg by mouth every morning.    . bimatoprost (LATISSE) 0.03 % ophthalmic solution Place into both eyes at bedtime. Place one drop on applicator and apply evenly along the skin of the upper eyelid at base of eyelashes once daily at bedtime; repeat procedure for second eye (use a clean applicator).    Marland Kitchen escitalopram (LEXAPRO) 20 MG tablet Take 20 mg by mouth daily.    Marland Kitchen glucose blood test strip Check BS three times per day for E11.9 100 each 12  . glucose monitoring kit (FREESTYLE) monitoring kit 1 each by Does not apply route as needed. Check BS three times per day for E11.9 1 each 0  . HYDROcodone-acetaminophen (NORCO/VICODIN) 5-325 MG per tablet Take 1-2 tablets by mouth every 6 hours as needed for pain and/or cough. 11 tablet 0  . ibuprofen (ADVIL,MOTRIN) 600 MG tablet Take 1 tablet (600 mg total) by mouth every 6 (six) hours as needed. 30 tablet 0  . INVOKANA 300 MG TABS tablet Take 1 tablet by mouth daily.  2  . Lancets (FREESTYLE) lancets Check BS three times per day for E11.9 100 each 12  . levothyroxine (SYNTHROID, LEVOTHROID) 175 MCG tablet Take 1 tablet by mouth every evening.  0  . levothyroxine (SYNTHROID, LEVOTHROID) 200 MCG tablet Take 1 tablet (200 mcg total) by mouth daily before breakfast. 30 tablet 3  . metFORMIN (GLUCOPHAGE) 500 MG tablet Take 2 tablets (1,000 mg total) by mouth 2 (two) times daily with a  meal. 60 tablet 4  . metFORMIN (GLUCOPHAGE-XR) 500 MG 24 hr tablet Take 4 tablets by mouth every evening.  0  . omeprazole (PRILOSEC) 20 MG capsule Take 1 capsule (20 mg total) by mouth daily. 30 capsule 3  . simvastatin (ZOCOR) 40 MG tablet Take 1 tablet (40 mg total) by mouth daily. 30 tablet 3  . traMADol (ULTRAM) 50 MG tablet Take 1 tablet (50 mg total) by mouth every 6 (six) hours as needed. 15 tablet 0  . buPROPion (WELLBUTRIN XL) 150 MG 24 hr tablet 1 qam  For 1 week then 2 qam therafter 60 tablet 2  . temazepam (RESTORIL) 15 MG capsule 1  qhs  and if it fails after 2 nights then increase to 2  qhs 60 capsule 3   No current facility-administered medications for this visit.    Previous Psychotropic Medications:  Medication Dose  Substance Abuse History in the last 12 months: Substance Age of 1st Use Last Use Amount Specific Type                                                                                              Medical Consequences of Substance Abuse:   Legal Consequences of Substance Abuse:   Family Consequences of Substance Abuse:   Blackouts:   DT's:   Withdrawal Symptoms:     Social History: Current Place of Residence: [place] Place of Birth:  Family Members:  Marital Status:  Single Children: 4  Sons:   Daughters:  Relationships:  Education:  Dentist Problems/Performance:  Religious Beliefs/Practices:  History of Abuse:  Ship broker History:   Legal History:  Hobbies/Interests:   Family History:   Family History  Problem Relation Age of Onset  . Diabetes Mother   . Stroke Mother   . Heart disease Mother   . Cancer Sister     Mental Status Examination/Evaluation: Objective:  Appearance: Casual  Eye Contact::  Fair  Speech:  Clear and Coherent  Volume:  Normal  Mood:  depresssed  Affect:  Blunt  Thought Process:  Goal Directed  Orientation:  Full (Time, Place,  and Person)  Thought Content:  WDL  Suicidal Thoughts:  No  Homicidal Thoughts:  No  Judgement:  Fair  Insight:  Fair  Psychomotor Activity:  Increased  Akathisia:  No  Handed:  Right  AIMS (if indicated):    Assets:      Laboratory/X-Ray Psychological Evaluation(s)        Assessment:  Axis I: Major Depression, Recurrent severe  AXIS I Major Depression, Recurrent severe  AXIS II Deferred  AXIS III Past Medical History  Diagnosis Date  . Depression   . Diabetes mellitus without complication   . Anxiety   . Arthritis   . Hyperlipidemia   . Thyroid disease      AXIS IV economic problems, occupational problems and problems related to social environment  AXIS V 51-60 moderate symptoms   Treatment Plan/Recommendations:  Plan of Care: At this time the patient will discontinue any Lexapro that she might of been taking. It should be noted that she takes tramadol and this would be contraindicated. The patient will begin on Wellbutrin which she's never been on the past. He'll titrate up to 300 mg. The patient she'll also begin on Restoril 15 mg to take one at night and if not improved will double. Today we will attempt to get the patient into the IOP program but because this patient has Medicaid I doubt this will be possible. We will make arrangements for this patient to have a therapist at the Center and encourage her to get back to her NA groups. This patient to return to see me in 7 weeks. At this time I do not believe this patient is dangerous to herself or others.   Laboratory:    Psychotherapy:  Medications:   Routine PRN Medications:    Consultations:   Safety Concerns:    Other:      Haskel Schroeder, MD 4/15/201610:08 AM

## 2014-06-17 NOTE — Telephone Encounter (Signed)
Received prior authorization form via fax from [facility] for patient's Temazepam and Restoril. Called [phone]. I spoke with Hassan Rowan and then transferred to [facility].  Per Catalina Antigua for [facility] I have to go to their web-site and get prior authorization form. Web-site is [email or web address]. I printed out [facility] Prior Approval Form.  Had Dr. Casimiro Needle sign and faxed it back.

## 2014-07-02 ENCOUNTER — Emergency Department (HOSPITAL_COMMUNITY)
Admission: EM | Admit: 2014-07-02 | Discharge: 2014-07-02 | Discharge status: Against Medical Advice | Payer: Medicaid Other | Attending: Emergency Medicine | Admitting: Emergency Medicine

## 2014-07-02 ENCOUNTER — Encounter (HOSPITAL_COMMUNITY): Payer: Self-pay | Admitting: *Deleted

## 2014-07-02 DIAGNOSIS — M79605 Pain in left leg: Secondary | ICD-10-CM | POA: Diagnosis present

## 2014-07-02 DIAGNOSIS — E119 Type 2 diabetes mellitus without complications: Secondary | ICD-10-CM | POA: Insufficient documentation

## 2014-07-02 DIAGNOSIS — Z720 Tobacco use: Secondary | ICD-10-CM | POA: Insufficient documentation

## 2014-07-02 NOTE — ED Notes (Signed)
Called patient back to an acute room but no answer.

## 2014-07-02 NOTE — ED Notes (Signed)
Called for patient to acute room, no answer.

## 2014-07-02 NOTE — ED Notes (Signed)
Pt reports she was seen here recently for same and was given motrin and ultram for the pain and was also given 4-5 days off of work.  Pt reports pain in her L leg is not getting better.  Has taken all of her meds without relief.  Knee sleeve to L knee noted.  Pt reports it helped some.  Pt states that she needs a "leg therapist", an MRI, more pain medicines and more time off of work.  Explained to pt the purpose of the ED and the importance of following up with her PCP.  Pt is ambulatory without difficulty.  Pt reports medial L leg pain.  No swelling noted at this time.

## 2014-07-06 ENCOUNTER — Ambulatory Visit (INDEPENDENT_AMBULATORY_CARE_PROVIDER_SITE_OTHER): Payer: Medicaid Other | Admitting: Clinical

## 2014-07-06 ENCOUNTER — Encounter (HOSPITAL_COMMUNITY): Payer: Self-pay | Admitting: Clinical

## 2014-07-06 DIAGNOSIS — F323 Major depressive disorder, single episode, severe with psychotic features: Secondary | ICD-10-CM | POA: Diagnosis not present
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
## 2014-07-26 ENCOUNTER — Ambulatory Visit (AMBULATORY_SURGERY_CENTER): Payer: Self-pay

## 2014-07-26 VITALS — Ht 67.75 in | Wt 231.2 lb

## 2014-07-26 DIAGNOSIS — Z800 Family history of malignant neoplasm of digestive organs: Secondary | ICD-10-CM

## 2014-07-26 MED ORDER — NA SULFATE-K SULFATE-MG SULF 17.5-3.13-1.6 GM/177ML PO SOLN: ORAL | Status: DC

## 2014-07-26 NOTE — Progress Notes (Signed)
  Pt came into the office today for her pre-visit prior to her colonoscopy with Dr Hilarie Fredrickson on 08/26/14.Pt states she had a colonoscopy done in 2005 at [facility] in [place].She did sign a medical release form which will be given to Dottie (CMA)     Per pt, no allergies to soy or egg products.Pt not taking any weight loss meds or using  O2 at home.

## 2014-07-27 ENCOUNTER — Other Ambulatory Visit: Payer: Self-pay | Admitting: Internal Medicine

## 2014-07-28 ENCOUNTER — Ambulatory Visit (HOSPITAL_COMMUNITY): Payer: Self-pay | Admitting: Clinical

## 2014-08-03 ENCOUNTER — Other Ambulatory Visit: Payer: Self-pay | Admitting: Family

## 2014-08-03 DIAGNOSIS — Z1231 Encounter for screening mammogram for malignant neoplasm of breast: Secondary | ICD-10-CM

## 2014-08-09 ENCOUNTER — Ambulatory Visit
Admission: RE | Admit: 2014-08-09 | Discharge: 2014-08-09 | Disposition: A | Discharge status: Home / Self Care | Payer: Medicaid Other | Source: Ambulatory Visit | Attending: Family | Admitting: Family

## 2014-08-09 ENCOUNTER — Encounter: Payer: Self-pay | Admitting: Gastroenterology

## 2014-08-09 DIAGNOSIS — Z1231 Encounter for screening mammogram for malignant neoplasm of breast: Secondary | ICD-10-CM

## 2014-08-10 ENCOUNTER — Ambulatory Visit (HOSPITAL_COMMUNITY): Payer: Self-pay | Admitting: Psychiatry

## 2014-08-11 ENCOUNTER — Other Ambulatory Visit: Payer: Self-pay | Admitting: Oncology

## 2014-08-17 ENCOUNTER — Telehealth (HOSPITAL_COMMUNITY): Payer: Self-pay

## 2014-08-17 DIAGNOSIS — F5101 Primary insomnia: Secondary | ICD-10-CM | POA: Insufficient documentation

## 2014-08-17 NOTE — Telephone Encounter (Signed)
Telephone message left for patient requesting she call back to discuss concerns with not sleeping as well currently with Temazepam lately and stating "I can't get it together".  States she missed last appointment and had to reschedule due to work schedule.  States having to take more breaks at work due to being tired from not sleeping as well at night.  Questioned patient on message if she is taking one or two Restoril at night as last instructions state she could go up to 2 at night if needed. Informed patient Dr. Casimiro Needle would be back in the office this afternoon to please call this nurse back today to discuss her concerns so this could be relayed to Dr. Casimiro Needle.

## 2014-08-26 ENCOUNTER — Ambulatory Visit (AMBULATORY_SURGERY_CENTER): Payer: Medicaid Other | Admitting: Internal Medicine

## 2014-08-26 ENCOUNTER — Encounter: Payer: Self-pay | Admitting: Internal Medicine

## 2014-08-26 VITALS — BP 123/65 | HR 80 | Temp 98.7°F | Resp 15 | Ht 67.0 in | Wt 231.0 lb

## 2014-08-26 DIAGNOSIS — D124 Benign neoplasm of descending colon: Secondary | ICD-10-CM | POA: Diagnosis not present

## 2014-08-26 DIAGNOSIS — Z800 Family history of malignant neoplasm of digestive organs: Secondary | ICD-10-CM | POA: Diagnosis not present

## 2014-08-26 DIAGNOSIS — D120 Benign neoplasm of cecum: Secondary | ICD-10-CM | POA: Diagnosis not present

## 2014-08-26 LAB — GLUCOSE, CAPILLARY
GLUCOSE-CAPILLARY: 97 mg/dL (ref 65–99)
Glucose-Capillary: 107 mg/dL — ABNORMAL HIGH (ref 65–99)

## 2014-08-26 MED ORDER — SODIUM CHLORIDE 0.9 % IV SOLN: 500.0000 mL | INTRAVENOUS | Status: DC

## 2014-08-26 NOTE — Progress Notes (Signed)
Called to room to assist during endoscopic procedure.  Patient ID and intended procedure confirmed with present staff. Received instructions for my participation in the procedure from the performing physician.  

## 2014-08-26 NOTE — Patient Instructions (Signed)
YOU HAD AN ENDOSCOPIC PROCEDURE TODAY AT [facility]:   Refer to the procedure report that was given to you for any specific questions about what was found during the examination.  If the procedure report does not answer your questions, please call your gastroenterologist to clarify.  If you requested that your care partner not be given the details of your procedure findings, then the procedure report has been included in a sealed envelope for you to review at your convenience later.  YOU SHOULD EXPECT: Some feelings of bloating in the abdomen. Passage of more gas than usual.  Walking can help get rid of the air that was put into your GI tract during the procedure and reduce the bloating. If you had a lower endoscopy (such as a colonoscopy or flexible sigmoidoscopy) you may notice spotting of blood in your stool or on the toilet paper. If you underwent a bowel prep for your procedure, you may not have a normal bowel movement for a few days.  Please Note:  You might notice some irritation and congestion in your nose or some drainage.  This is from the oxygen used during your procedure.  There is no need for concern and it should clear up in a day or so.  SYMPTOMS TO REPORT IMMEDIATELY:   Following lower endoscopy (colonoscopy or flexible sigmoidoscopy):  Excessive amounts of blood in the stool  Significant tenderness or worsening of abdominal pains  Swelling of the abdomen that is new, acute  Fever of 100F or higher  For urgent or emergent issues, a gastroenterologist can be reached at any hour by calling [phone].   DIET: Your first meal following the procedure should be a small meal and then it is ok to progress to your normal diet. Heavy or fried foods are harder to digest and may make you feel nauseous or bloated.  Likewise, meals heavy in dairy and vegetables can increase bloating.  Drink plenty of fluids but you should avoid alcoholic beverages for 24  hours.  ACTIVITY:  You should plan to take it easy for the rest of today and you should NOT DRIVE or use heavy machinery until tomorrow (because of the sedation medicines used during the test).    FOLLOW UP: Our staff will call the number listed on your records the next business day following your procedure to check on you and address any questions or concerns that you may have regarding the information given to you following your procedure. If we do not reach you, we will leave a message.  However, if you are feeling well and you are not experiencing any problems, there is no need to return our call.  We will assume that you have returned to your regular daily activities without incident.  If any biopsies were taken you will be contacted by phone or by letter within the next 1-3 weeks.  Please call us at [phone] if you have not heard about the biopsies in 3 weeks.    SIGNATURES/CONFIDENTIALITY: You and/or your care partner have signed paperwork which will be entered into your electronic medical record.  These signatures attest to the fact that that the information above on your After Visit Summary has been reviewed and is understood.  Full responsibility of the confidentiality of this discharge information lies with you and/or your care-partner.  Polyp information given.  Return to work 08/28/14.

## 2014-08-26 NOTE — Progress Notes (Signed)
Stable to RR 

## 2014-08-26 NOTE — Op Note (Signed)
[facility]  [address] [place], [id number]   COLONOSCOPY PROCEDURE REPORT  PATIENT: Raven [name], Raven [name]  MR#: [id number] BIRTHDATE: [birth year] , 53  yrs. old GENDER: female ENDOSCOPIST: Jerene Bears, MD REFERRED BY: Smothers, Deborah PROCEDURE DATE:  08/26/2014 PROCEDURE:   Colonoscopy, screening and Colonoscopy with cold biopsy polypectomy First Screening Colonoscopy - Avg.  risk and is 50 yrs.  old or older Yes.  Prior Negative Screening - Now for repeat screening. N/A  History of Adenoma - Now for follow-up colonoscopy & has been > or = to 3 yrs.  N/A  Polyps removed today? 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 MEDICATIONS: Monitored anesthesia care, Propofol 300 mg IV, and Lidocaine 40 mg IV  DESCRIPTION OF PROCEDURE:   After the risks benefits and alternatives of the procedure were thoroughly explained, informed consent was obtained.  The digital rectal exam revealed no rectal mass.   The LB [id number]  endoscope was introduced through the anus and advanced to the cecum, which was identified by both the appendix and ileocecal valve. No adverse events experienced. The quality of the prep was good.  (Suprep was used)  The instrument was then slowly withdrawn as the colon was fully examined. Estimated blood loss is zero unless otherwise noted in this procedure report.   COLON FINDINGS: Three sessile polyps ranging between 3-49mm in size were found at the cecum (1) and in the descending colon (2). Polypectomies were performed with cold forceps.  The resection was complete, the polyp tissue was completely retrieved and sent to histology.   The examination was otherwise normal.  Retroflexed views revealed internal hemorrhoids. The time to cecum = 2.9 Withdrawal time = 13.9   The scope was withdrawn and the procedure completed. COMPLICATIONS: There were no immediate  complications.  ENDOSCOPIC IMPRESSION: 1.   Three sessile polyps ranging between 3-55mm in size were found at the cecum and in the descending colon; polypectomies were performed with cold forceps 2.   The examination was otherwise normal  RECOMMENDATIONS: 1.  Await pathology results 2.  Timing of repeat colonoscopy will be determined by pathology findings. 3.  You will receive a letter within 1-2 weeks with the results of your biopsy as well as final recommendations.  Please call my office if you have not received a letter after 3 weeks.  eSigned:  Jerene Bears, MD 08/26/2014 3:19 PM   cc:  the patient, PCP

## 2014-08-29 ENCOUNTER — Ambulatory Visit (INDEPENDENT_AMBULATORY_CARE_PROVIDER_SITE_OTHER): Payer: Self-pay | Admitting: Clinical

## 2014-08-29 ENCOUNTER — Encounter (HOSPITAL_COMMUNITY): Payer: Self-pay | Admitting: Clinical

## 2014-08-29 ENCOUNTER — Telehealth: Payer: Self-pay | Admitting: *Deleted

## 2014-08-29 DIAGNOSIS — F333 Major depressive disorder, recurrent, severe with psychotic symptoms: Secondary | ICD-10-CM

## 2014-08-29 NOTE — Telephone Encounter (Signed)
  Follow up Call-  Call back number 08/26/2014  Post procedure Call Back phone  # [phone]  Permission to leave phone message Yes     Patient questions:  Do you have a fever, pain , or abdominal swelling? No. Pain Score  0 *  Have you tolerated food without any problems? Yes.    Have you been able to return to your normal activities? Yes.    Do you have any questions about your discharge instructions: Diet   No. Medications  No. Follow up visit  No.  Do you have questions or concerns about your Care? No.  Actions: * If pain score is 4 or above: No action needed, pain <4.
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
## 2014-08-30 NOTE — Progress Notes (Signed)
THERAPIST PROGRESS NOTE  Session Time: 11:00 -12:00  Participation Level: Active  Behavioral Response: CasualAlertDepressed  Type of Therapy: Individual Therapy  Treatment Goals addressed: improve psychiatric symptoms  Interventions: Motivational Interviewing, grounding techniques  Summary: Raven [name] is a 54 y.o. female who presents with Major depressive disorder, recurrent, severe without psychotic features.   Suicidal/Homicidal: Nowithout intent/plan  Therapist Response:  Ledia met with clinician for an individual session. Raven [name] had come for an assessment 07/06/14. She had not returned since. She shared that there was a Mish wait for an appointment and that her appointment had been cancelled. She shared that She also realized that she does need treatment. Client and clinician reviewed and updated her assessment. Raven [name] shared that there she was having more psychosis than she had let on prior. She shared that it makes her anxious to tell others what is going on. She shared that she finds it difficult to take a shower because the water speaks to her. She shared that she knows it is a hallucination but it increases the difficulty of caring for herself. She shared that her depressive symptoms also continue to be bad. She shared that the only reason she leaves the house is to take her son out or to take care of business. Client and clinician discussed her symptoms and her current coping skills. She shared that she has been participating in Vocational Rehabilitation. She shared that she applied for disability but needs to have money. She shared that it is really challenging to work. Clinician introduced grounding techniques, described the process and the purpose. Client and clinician practiced some of the techniques together. Raven [name] agreed to practice the techniques daily until next session.  Plan: Return again in 1 weeks.  Diagnosis: Axis I: Major depressive disorder, recurrent, severe  without psychotic features       , A, LCSW 08/30/2014    

## 2014-09-01 ENCOUNTER — Encounter: Payer: Self-pay | Admitting: Internal Medicine

## 2014-09-16 ENCOUNTER — Ambulatory Visit (INDEPENDENT_AMBULATORY_CARE_PROVIDER_SITE_OTHER): Payer: Self-pay | Admitting: Psychiatry

## 2014-09-16 VITALS — BP 132/74 | HR 91 | Ht 67.75 in | Wt 228.8 lb

## 2014-09-16 DIAGNOSIS — F331 Major depressive disorder, recurrent, moderate: Secondary | ICD-10-CM

## 2014-09-16 DIAGNOSIS — F330 Major depressive disorder, recurrent, mild: Secondary | ICD-10-CM

## 2014-09-16 MED ORDER — TRAZODONE HCL 100 MG PO TABS: ORAL_TABLET | ORAL | Status: DC

## 2014-09-16 MED ORDER — BUPROPION HCL ER (XL) 150 MG PO TB24: ORAL_TABLET | ORAL | Status: DC

## 2014-09-16 NOTE — Progress Notes (Signed)
[facility] MD Progress Note  09/16/2014 11:36 AM Raven [name]  MRN:  [id number] Subjective: Still sad Principal Problem: Major depression recurrent mild Diagnosis:  Major depression recurrent mild Patient Active Problem List   Diagnosis Date Noted  . Diabetes mellitus, type 2 [E11.9] 03/03/2014  . Hypothyroidism [E03.9] 03/03/2014   Total Time spent with patient: 15 minutes   Past Medical History:  Past Medical History  Diagnosis Date  . Depression   . Diabetes mellitus without complication   . Anxiety   . Arthritis   . Hyperlipidemia   . Thyroid disease   . Pain     a lot pain in elbows,knees,shoulders,thumbs  . Glaucoma     Past Surgical History  Procedure Laterality Date  . Thyroidectomy    . Eye surgery      Bil   Family History:  Family History  Problem Relation Age of Onset  . Diabetes Mother   . Stroke Mother   . Heart disease Mother   . Cancer Sister   . Alcohol abuse Father   . Colon cancer Father   . Alcohol abuse Sister   . Alcohol abuse Brother   . Drug abuse Brother   . Alcohol abuse Brother   . Drug abuse Brother    Social History:  History  Alcohol Use No     History  Drug Use No    History   Social History  . Marital Status: Single    Spouse Name: N/A  . Number of Children: N/A  . Years of Education: N/A   Social History Main Topics  . Smoking status: Current Every Day Smoker -- 0.25 packs/day for 3 years    Types: Cigarettes  . Smokeless tobacco: Never Used  . Alcohol Use: No  . Drug Use: No  . Sexual Activity: No   Other Topics Concern  . Not on file   Social History Narrative   Additional History:    Sleep: Poor  Appetite:  Good  At this time the patient still describes being depressed. She is very noncompliant and does not seem to follow instructions well. On her last visit we asked her to discontinue her Lexapro but she did not. The patient missed her last appointment with me. One of the reasons we wanted her to stop her  Lexapro was that she was taking tramadol. Also was that she was not feeling better. We therefore began 300 mg of Wellbutrin which she has been taking. Unfortunately the Restoril use for sleep was not helpful. This patient is not suicidal. The patient gives some vague descriptions of voices. The patient is not suicidal she is not homicidal and at this time she is working. The patient actually is functioning somewhat better. Today we didn't have time to discuss her knee pain her mother who is in a nursing home or a 3 year old son. I strongly recommend that she discuss these things with her therapist which she has started in the setting. It is focused medication evaluation we made some changes. Musculoskeletal: Strength & Muscle Tone: within normal limits Gait & Station: normal Patient leans: Right   Psychiatric Specialty Exam: Physical Exam  ROS  Blood pressure 132/74, pulse 91, height 5' 7.75" (1.721 m), weight 228 lb 12.8 oz (103.783 kg).Body mass index is 35.04 kg/(m^2).  General Appearance: Casual  Eye Contact::  Poor  Speech:  Clear and Coherent  Volume:  Normal  Mood:  Depressed  Affect:  Congruent  Thought Process:  Coherent  Orientation:  Full (Time, Place, and Person)  Thought Content:  WDL  Suicidal Thoughts:  No  Homicidal Thoughts:  No  Memory:  NA  Judgement:  Fair  Insight:  Lacking  Psychomotor Activity:  Normal  Concentration:  NA  Recall:  [place] of Knowledge:Fair  Language: Fair  Akathisia:  No  Handed:  Right  AIMS (if indicated):     Assets:  Desire for Improvement  ADL's:  Intact  Cognition: WNL  Sleep:        Current Medications: Current Outpatient Prescriptions  Medication Sig Dispense Refill  . ALPHAGAN P 0.1 % SOLN Place 1 drop into both eyes 3 (three) times daily.  0  . aspirin 81 MG chewable tablet Chew 81 mg by mouth every morning.    . bimatoprost (LATISSE) 0.03 % ophthalmic solution Place into both eyes at bedtime. Place one drop on  applicator and apply evenly along the skin of the upper eyelid at base of eyelashes once daily at bedtime; repeat procedure for second eye (use a clean applicator).    Marland Kitchen buPROPion (WELLBUTRIN XL) 150 MG 24 hr tablet 3  qam 90 tablet 2  . escitalopram (LEXAPRO) 20 MG tablet Take 20 mg by mouth daily.    Marland Kitchen glucose blood test strip Check BS three times per day for E11.9 100 each 12  . glucose monitoring kit (FREESTYLE) monitoring kit 1 each by Does not apply route as needed. Check BS three times per day for E11.9 1 each 0  . HYDROcodone-acetaminophen (NORCO/VICODIN) 5-325 MG per tablet Take 1-2 tablets by mouth every 6 hours as needed for pain and/or cough. (Patient not taking: Reported on 07/06/2014) 11 tablet 0  . ibuprofen (ADVIL,MOTRIN) 600 MG tablet Take 1 tablet (600 mg total) by mouth every 6 (six) hours as needed. (Patient not taking: Reported on 07/26/2014) 30 tablet 0  . INVOKAMET [phone] MG TABS TK 1 T PO  BID  0  . Lancets (FREESTYLE) lancets Check BS three times per day for E11.9 100 each 12  . levothyroxine (SYNTHROID, LEVOTHROID) 175 MCG tablet Take 1 tablet by mouth every evening.  0  . levothyroxine (SYNTHROID, LEVOTHROID) 200 MCG tablet Take 1 tablet (200 mcg total) by mouth daily before breakfast. 30 tablet 3  . omeprazole (PRILOSEC) 20 MG capsule Take 1 capsule (20 mg total) by mouth daily. 30 capsule 3  . simvastatin (ZOCOR) 40 MG tablet Take 1 tablet (40 mg total) by mouth daily. (Patient not taking: Reported on 08/26/2014) 30 tablet 3  . temazepam (RESTORIL) 15 MG capsule 1  qhs  and if it fails after 2 nights then increase to 2  qhs 60 capsule 3  . traMADol (ULTRAM) 50 MG tablet Take 1 tablet (50 mg total) by mouth every 6 (six) hours as needed. 15 tablet 0  . traZODone (DESYREL) 100 MG tablet 1  qhs  If  Not sleeping increase to 2 qhs 60 tablet 3   No current facility-administered medications for this visit.    Lab Results: No results found for this or any previous visit (from  the past 48 hour(s)).  Physical Findings: AIMS:  , ,  ,  ,    CIWA:    COWS:     Treatment Plan Summary: At this time the patient is instructed to take half of her 20 mg of Lexapro for 4 days and then to discontinue it. The patient now old increase her Wellbutrin to taking the full dose of 450 mg.  She'll continue in therapy in the setting. Her first problem therefore is that of depression which we will increase her antidepression by taking more Wellbutrin. She continues to be in pain and for some reason she seems to think that her primary care doctor is asking [place] to give her pain medicine. I shared with her that we do not give pain medicine here. The patient is also having problems sleeping. At this time we'll discontinue her Restoril and begin her on trazodone 100 mg 1 or 2 at night. This patient will continue in therapy and return to see me in 2 months. This patient denies any physical complaints at all. She denies any chest pain or shortness of breath and she denies any neurological symptoms.   Medical Decision Making:  Established Problem, Stable/Improving (1)     Neenah Canter IRVING 09/16/2014, 11:36 AM

## 2014-09-27 ENCOUNTER — Encounter (HOSPITAL_COMMUNITY): Payer: Self-pay | Admitting: Clinical

## 2014-09-27 ENCOUNTER — Ambulatory Visit (INDEPENDENT_AMBULATORY_CARE_PROVIDER_SITE_OTHER): Payer: Self-pay | Admitting: Clinical

## 2014-09-27 ENCOUNTER — Ambulatory Visit (HOSPITAL_COMMUNITY): Payer: Self-pay | Admitting: Clinical

## 2014-09-27 DIAGNOSIS — F333 Major depressive disorder, recurrent, severe with psychotic symptoms: Secondary | ICD-10-CM

## 2014-09-27 NOTE — Progress Notes (Signed)
   THERAPIST PROGRESS NOTE  Session Time: 3:30 - 4:28  Participation Level: Active  Behavioral Response: CasualAlertDepressed  Type of Therapy: Individual Therapy  Treatment Goals addressed: improve psychiatric symptoms, improve unhelpful thinking patterns, reduce hallucinations and/or delusions, interpersonal relationship skills   Interventions: CBT, Motivational Interviewing, Grounding and Mindfulness Techniques  Summary: Raven [name]  is a 51.y.o. female who presents with Major Depressive Disorder with psychotic features.  Suicidal/Homicidal: No -without intent/plan  Therapist Response:  Hassan Rowan met with clinician for an individual session. Raven [name] discussed her psychiatric symptoms and her current life events. Raven [name] shared that she has been struggling to set a better schedule for her and her son. Raven [name] shared that she had been struggling to get school placement  for her son who struggles with behavioral problems. She shared that he was accepted somewhere but the funding for the program was cut and this had happened again. She believes she has him set up now but is disappointed in the teacher to child ratio. She shared how she would like to improve both of their schedule so that there is more structure. Raven [name] shared that she struggles with her symptoms. She shared that he medication was recently adjusted and that she is hopeful that this will help. She shared she is not sleeping well, Raven [name] and clinician discussed the importance of including a healthy bed time routine into her schedule to assist with her sleep. She shared her sleep issues were exasperated by a hallucinations of someone in the hall. She shared that the image frightened her and  caused her to leave the lights on to sleep. Raven [name] shared she knew the person wasn't there but it frightened her none the less. Raven [name] and clinician discussed the importance of taking her medication as prescribed. Clinician introduced and discussed  the use of grounding and mindfulness techniques. Raven [name] and clinician discussed the process, purpose and practice of the techniques. Raven [name] and clinician practiced some of the techniques together. Clinician shared how she could use the techniques to help interrupt hallucinations and the negative thoughts and emotions she felt. Raven [name] and clinician discussed how she could share the techniques with her son. [name] shared that she liked the techniques and was willing to practice them until next session.   Plan: Return again in 1 weeks.  Diagnosis: Axis I: Severe recurrent major depressive disorder with psychotic features   Swan Zayed A, LCSW 09/27/2014

## 2014-10-11 ENCOUNTER — Encounter (HOSPITAL_COMMUNITY): Payer: Self-pay | Admitting: Clinical

## 2014-10-11 ENCOUNTER — Telehealth (HOSPITAL_COMMUNITY): Payer: Self-pay

## 2014-10-11 ENCOUNTER — Encounter (HOSPITAL_COMMUNITY): Payer: Self-pay | Admitting: Psychiatry

## 2014-10-11 ENCOUNTER — Ambulatory Visit (HOSPITAL_COMMUNITY): Payer: Self-pay | Admitting: Psychiatry

## 2014-10-11 ENCOUNTER — Ambulatory Visit (INDEPENDENT_AMBULATORY_CARE_PROVIDER_SITE_OTHER): Payer: Self-pay | Admitting: Clinical

## 2014-10-11 VITALS — BP 120/80 | HR 89

## 2014-10-11 DIAGNOSIS — F331 Major depressive disorder, recurrent, moderate: Secondary | ICD-10-CM

## 2014-10-11 DIAGNOSIS — F333 Major depressive disorder, recurrent, severe with psychotic symptoms: Secondary | ICD-10-CM

## 2014-10-11 MED ORDER — RISPERIDONE 1 MG PO TABS: 1.0000 mg | ORAL_TABLET | Freq: Every day | ORAL | Status: DC

## 2014-10-11 NOTE — Telephone Encounter (Signed)
Medication management - Patient in with Raven [name], therapist today stating she is more depressed and having some auditory hallucinations and paranoid thoughts as keeps thinking someone is there around her.  Raven [name], therapist reported patient was having more problems today and more depressed with crying and stating voices make her think someone is in her hall at home and coming at her.  Patient reported not sleeping as well since Dr. Casimiro [name] changed her medication to Trazodone and is taking 2 at bedtime.  Reports only sleeps 4-5 hours but up 4-5 times a night.  Patient having some passive suicidal ideations but denies any at this time with no plan or intent.  Denies any thoughts of wanting to harm anyone else and states she would not do that due to caring for her 50 year old son.  Informed Dr. Casimiro [name] is off this week and requested Dr. Salem [name] talk with patient to question possible medication changes as patient denies need to inpatient.   Patient reported Lexapro she states taking in the past helped calm her down more and recently stopped Prozac.  Says Lexapro was prescribed in [place].  Denies being allergic to any medications or anything and denies any substance use or abuse.  Patient currently only taking Wellbutrin 150mg , 3 in the morning and Trazodone 100mg , 2 at bedtime. Dr. Salem [name] came to meet with patient as patient denied need to go inpatient.  Reports hearing some voices telling her to cut her wrist but will not do this as states she would not act on thoughts or voices.  Patient declined need for inpatient stay and reported she would do medication changes.  Admits positive auditory and now some visual hallucinations but reports she no plan or intent to harm self or others and is willing to stop all Wellbutrin as ordered by Dr. Salem [name] today and will pick up ordered Risperdal 1mg , one at bedtime.  This one time order was e-scribed to patient's [facility] off [address] and patient  also agreed to return to see Dr. Salem [name] at 8:00am on 10/13/14 as Dr. Casimiro [name] is off this week on vacation.  Patient was given written instructions and stated understanding plans.  Agreed to call 911, TTS or this nurse if any worsening of symptoms or began to have any thoughts she may act on passive SI.   Denies any HI at all with no plan or intent.

## 2014-10-11 NOTE — Progress Notes (Signed)
   THERAPIST PROGRESS NOTE  Session Time: 3:33 - 4:18  Participation Level: Active  Behavioral Response: CasualConfusedAnxious and Depressed  Type of Therapy: Individual Therapy  Treatment Goals addressed: improve psychiatric symptoms, reduce hallucinations and/or delusions,   Interventions: CBT, Motivational Interviewing, Grounding and Mindfulness Techniques  Summary: Raven [name]  is a 51.y.o. female who presents with Major Depressive Disorder with psychotic features.  Suicidal/Homicidal: No -without intent/plan  Therapist Response:  Hassan Rowan met with clinician for an individual session. [name] discussed her psychiatric symptoms and her current life events. [name] shared that she was having a very hard time lately.  She shared that her son just returned home and she is anxious about school starting. Client and clinician discussed he symptoms. She shared that she was very depressed. She shared she had been more tearful, that she had increased hallucinations, that she was not sleeping well. [name] shared that she has been having increasing thoughts about death, but  she would never act on it because she  loves and knows she has a son who loves her. Client and clinician discussed the option of inpatient. [name] shared that she did not want to go inpatient and would not act on her negative thoughts. Client and clinician reviewed the emergency numbers and [name] was given a copy of the numbers. Client and clinician discussed if [name] was taking her medication as prescribed. [name] shared she was but that it was recently increased. Clinician walked [name] to the staff nurse who took over the discussion about medication. Clinician was informed that nurse and client consultated with a psychiatrist and [name]'s medication was adjusted  and she was scheduled a follow up appointment   Plan: Return again in 1 weeks.  Diagnosis: Axis I: Severe recurrent major depressive disorder with psychotic  features    Jacqueline Spofford A, LCSW 10/11/2014

## 2014-10-11 NOTE — Progress Notes (Unsigned)
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 Diabetes mellitus, type 2 [E11.9] 03/03/2014  . Hypothyroidism [E03.9] 03/03/2014   Total Time spent with patient: 30 minutes   Past Medical History:  Past Medical History  Diagnosis Date  . Depression   . Diabetes mellitus without complication   . Anxiety   . Arthritis   . Hyperlipidemia   . Thyroid disease   . Pain     a lot pain in elbows,knees,shoulders,thumbs  . Glaucoma     Past Surgical History  Procedure Laterality Date  . Thyroidectomy    . Eye surgery      Bil   Family History:  Family History  Problem Relation Age of Onset  . Diabetes Mother   . Stroke Mother   . Heart disease Mother   . Cancer Sister   . Alcohol abuse Father   . Colon cancer Father   . Alcohol abuse Sister   . Alcohol abuse Brother   . Drug abuse Brother   . Alcohol abuse Brother   . Drug abuse Brother    Social History:  History  Alcohol Use No     History  Drug Use No    History   Social History  . Marital Status: Single    Spouse Name: N/A  . Number of Children: N/A  . Years of Education: N/A   Social History Main Topics  . Smoking status: Current Every Day Smoker -- 0.25 packs/day for 3 years    Types: Cigarettes  . Smokeless tobacco: Never Used  . Alcohol Use: No  . Drug Use: No  . Sexual Activity: No   Other Topics Concern  . None   Social History Narrative   Additional History:    Sleep: Poor  Appetite:  Good   Assessment   , patient seen on an emergency basis by Dr. Salem Senate, along with RN Beather Arbour.  Patient states that she is taking her medications Wellbutrin 450 mg in the morning and trazodone 200 mg at night and nothing is helping. Patient is  very overwhelmed with her mothers illness and they're trying to get the power of attorney for the mother. Patient states that she's been hearing voices talk to her telling her to cut herself but states that she will not do it because of her son.  Discussed inpatient hospitalization at this time but patient declined states that she has to be there for the power of attorney. States that she is not sleeping well, appetite is fair to poor mood is depressed and feels anhedonic. Denies suicidal or homicidal ideation has auditory command hallucinations no delusions . Discussed rationale risks benefits options off Risperdal 1 mg at bedtime and patient gave informed consent. She started tonight. Also discussed discontinuing Wellbutrin at this time as they could contribution to the hallucinations. She'll continue trazodone 200 mg at bedtime. She'll return to see me in the clinic in 2 days.   . Musculoskeletal: Strength & Muscle Tone: within normal limits Gait & Station: normal Patient leans: Right   Psychiatric Specialty Exam: Physical Exam  Review of Systems  Constitutional: Positive for malaise/fatigue. Negative for fever, chills, weight loss and diaphoresis.  HENT: Negative for ear discharge, ear pain, hearing loss, nosebleeds and tinnitus.  Eyes: Negative for blurred vision, double vision, photophobia and pain.  Respiratory: Negative for cough and hemoptysis.   Cardiovascular: Negative for chest pain, palpitations, orthopnea and leg swelling.  Gastrointestinal: Positive for heartburn. Negative for nausea, vomiting, abdominal pain, diarrhea and blood in stool.  Genitourinary: Negative for dysuria, urgency, hematuria and flank pain.  Musculoskeletal: Positive for myalgias. Negative for back pain, joint pain, falls and neck pain.  Skin: Negative for itching and rash.  Neurological: Positive for headaches. Negative for dizziness, tingling, tremors, speech change, focal weakness, seizures and weakness.   Endo/Heme/Allergies: Negative for environmental allergies and polydipsia. Does not bruise/bleed easily.  Psychiatric/Behavioral: Positive for depression and hallucinations. The patient is nervous/anxious and has insomnia.     Blood pressure 120/80, pulse 89.There is no weight on file to calculate BMI.  General Appearance: Casual  Eye Contact::  Poor  Speech:  Clear and Coherent  Volume:  Normal  Mood:  Depressed  Affect:  Congruent  Thought Process:  Coherent  Orientation:  Full (Time, Place, and Person)  Thought Content:  Auditory hallucinations   Suicidal Thoughts:  No  Homicidal Thoughts:  No  Memory:  NA  Judgement:  Fair  Insight:  Lacking  Psychomotor Activity:  Normal  Concentration:  NA  Recall:  [employer] of Knowledge:Fair  Language: Fair  Akathisia:  No  Handed:  Right  AIMS (if indicated):     Assets:  Desire for Improvement  ADL's:  Intact  Cognition: WNL  Sleep:        Current Medications: Current Outpatient Prescriptions  Medication Sig Dispense Refill  . ALPHAGAN P 0.1 % SOLN Place 1 drop into both eyes 3 (three) times daily.  0  . aspirin 81 MG chewable tablet Chew 81 mg by mouth every morning.    . bimatoprost (LATISSE) 0.03 % ophthalmic solution Place into both eyes at bedtime. Place one drop on applicator and apply evenly along the skin of the upper eyelid at base of eyelashes once daily at bedtime; repeat procedure for second eye (use a clean applicator).    Marland Kitchen escitalopram (LEXAPRO) 20 MG tablet Take 20 mg by mouth daily.    Marland Kitchen glucose blood test strip Check BS three times per day for E11.9 100 each 12  . glucose monitoring kit (FREESTYLE) monitoring kit 1 each by Does not apply route as needed. Check BS three times per day for E11.9 1 each 0  . HYDROcodone-acetaminophen (NORCO/VICODIN) 5-325 MG per tablet Take 1-2 tablets by mouth every 6 hours as needed for pain and/or cough. (Patient not taking: Reported on 07/06/2014) 11 tablet 0  . ibuprofen  (ADVIL,MOTRIN) 600 MG tablet Take 1 tablet (600 mg total) by mouth every 6 (six) hours as needed. (Patient not taking: Reported on 07/26/2014) 30 tablet 0  . INVOKAMET [phone] MG TABS TK 1 T PO  BID  0  . Lancets (FREESTYLE) lancets Check BS three times per day for E11.9 100 each 12  . levothyroxine (SYNTHROID, LEVOTHROID) 175 MCG tablet Take 1 tablet by mouth every evening.  0  . levothyroxine (SYNTHROID, LEVOTHROID) 200 MCG tablet Take 1 tablet (200 mcg total) by mouth daily before breakfast. 30 tablet 3  . omeprazole (PRILOSEC) 20 MG capsule Take 1 capsule (20 mg total) by mouth daily. 30 capsule 3  . risperiDONE (RISPERDAL) 1 MG tablet Take 1 tablet (1 mg total) by mouth daily. 30 tablet 0  . simvastatin (ZOCOR) 40 MG tablet Take 1 tablet (40 mg total) by  mouth daily. (Patient not taking: Reported on 08/26/2014) 30 tablet 3  . temazepam (RESTORIL) 15 MG capsule 1  qhs  and if it fails after 2 nights then increase to 2  qhs 60 capsule 3  . traMADol (ULTRAM) 50 MG tablet Take 1 tablet (50 mg total) by mouth every 6 (six) hours as needed. 15 tablet 0  . traZODone (DESYREL) 100 MG tablet 1  qhs  If  Not sleeping increase to 2 qhs 60 tablet 3   No current facility-administered medications for this visit.    Lab Results: None  Physical Findings: AIMS:  , ,  ,  ,    CIWA:    COWS:     Treatment Plan Summary: #1 Psychosis will be treated with Risperdal 1 mg by mouth every at bedtime.  #2 DC Wellbutrin. As this could be contribution to her auditory hallucinations.  #3 continue trazodone 200 mg by mouth daily at bedtime.  #4 return to clinic in 2 days to see me in [place].  #5 patient will call as if she feels unsafe.  #6 patient was seen on an emergency basis for 30 minutes. More than 50% of the time was spent in trying to convince the patient to come into the hospital and getting her to contract for safety.     Medical Decision Making: High Medication management

## 2014-10-12 ENCOUNTER — Ambulatory Visit (HOSPITAL_COMMUNITY): Payer: Self-pay | Admitting: Psychiatry

## 2014-10-13 ENCOUNTER — Telehealth (HOSPITAL_COMMUNITY): Payer: Self-pay

## 2014-10-13 ENCOUNTER — Ambulatory Visit (HOSPITAL_COMMUNITY): Payer: Self-pay | Admitting: Psychiatry

## 2014-10-13 VITALS — BP 124/84 | HR 81 | Ht 67.75 in | Wt 225.8 lb

## 2014-10-13 DIAGNOSIS — F333 Major depressive disorder, recurrent, severe with psychotic symptoms: Secondary | ICD-10-CM

## 2014-10-13 NOTE — Progress Notes (Unsigned)
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 Severe recurrent major depressive disorder with psychotic features [F33.3] 10/13/2014    Priority: High  . Diabetes mellitus, type 2 [E11.9] 03/03/2014  . Hypothyroidism [E03.9] 03/03/2014   Total Time spent with patient: 30 minutes   Past Medical History:  Past Medical History  Diagnosis Date  . Depression   . Diabetes mellitus without complication   . Anxiety   . Arthritis   . Hyperlipidemia   . Thyroid disease   . Pain     a lot pain in elbows,knees,shoulders,thumbs  . Glaucoma     Past Surgical History  Procedure Laterality Date  . Thyroidectomy    . Eye surgery      Bil   Family History:  Family History  Problem Relation Age of Onset  . Diabetes Mother   . Stroke Mother   . Heart disease Mother   . Cancer Sister   . Alcohol abuse Father   . Colon cancer Father   . Alcohol abuse Sister   . Alcohol abuse Brother   . Drug abuse Brother   . Alcohol abuse Brother   . Drug abuse Brother    Social History:  History  Alcohol Use No     History  Drug Use No    Social History   Social History  . Marital Status: Single    Spouse Name: N/A  . Number of Children: N/A  . Years of Education: N/A   Social History Main Topics  . Smoking status: Current Every Day Smoker -- 0.25 packs/day for 3 years    Types: Cigarettes  . Smokeless tobacco: Never Used  . Alcohol Use: No  . Drug Use: No  . Sexual Activity: No   Other Topics Concern  . Not on file   Social History Narrative   Additional History:    Sleep: Poor  Appetite:  Good   Assessment   , patient seen today for follow-up. She had been started on Risperdal 1 mg by mouth  daily at bedtime for her hallucinations and the Wellbutrin had been discontinued. Patient states the pharmacy did not get her Risperdal and so she did not take the Risperdal but she discontinued the Wellbutrin. States that her sleep continues to be disturbed she sees shadows and spots which she attributes to her glaucoma. Denies hearing auditory hallucinations and patient does not appear confused as she did 2 days ago. Appetite is fair denies suicidal or homicidal ideation. She'll continue trazodone 200 mg at bedtime. She'll see Dr. Roland Earl next week.   . Musculoskeletal: Strength & Muscle Tone: within normal limits Gait & Station: normal Patient leans: Right   Psychiatric Specialty Exam: Physical Exam  Review of Systems  Constitutional: Positive for malaise/fatigue. Negative for fever, chills, weight loss and diaphoresis.  HENT: Negative for ear discharge, ear pain, hearing loss, nosebleeds and tinnitus.   Eyes: Negative for blurred vision, double vision, photophobia and pain.  Respiratory: Negative for cough and hemoptysis.   Cardiovascular: Negative for chest pain, palpitations, orthopnea and leg swelling.  Gastrointestinal: Positive for heartburn. Negative for nausea, vomiting, abdominal pain, diarrhea and blood in stool.  Genitourinary: Negative for dysuria, urgency, hematuria and flank pain.  Musculoskeletal: Positive  for myalgias. Negative for back pain, joint pain, falls and neck pain.  Skin: Negative for itching and rash.  Neurological: Positive for headaches. Negative for dizziness, tingling, tremors, speech change, focal weakness, seizures and weakness.  Endo/Heme/Allergies: Negative for environmental allergies and polydipsia. Does not bruise/bleed easily.  Psychiatric/Behavioral: Positive for depression and hallucinations. The patient is nervous/anxious and has insomnia.     There were no vitals taken for this visit.There is no weight on file to calculate BMI.  General  Appearance: Casual  Eye Contact::  Fair   Speech:  Clear and Coherent  Volume:  Normal  Mood:  Depressed  Affect:  Congruent  Thought Process:  Coherent  Orientation:  Full (Time, Place, and Person)  Thought Content:  Some rumination, no auditory hallucinations   Suicidal Thoughts:  No  Homicidal Thoughts:  No  Memory:  NA  Judgement:  Fair  Insight:  Fair   Psychomotor Activity:  Normal  Concentration:  NA  Recall:  [employer] of Knowledge:Fair  Language: Fair  Akathisia:  No  Handed:  Right  AIMS (if indicated):     Assets:  Desire for Improvement  ADL's:  Intact  Cognition: WNL  Sleep:        Current Medications: Current Outpatient Prescriptions  Medication Sig Dispense Refill  . ALPHAGAN P 0.1 % SOLN Place 1 drop into both eyes 3 (three) times daily.  0  . aspirin 81 MG chewable tablet Chew 81 mg by mouth every morning.    . bimatoprost (LATISSE) 0.03 % ophthalmic solution Place into both eyes at bedtime. Place one drop on applicator and apply evenly along the skin of the upper eyelid at base of eyelashes once daily at bedtime; repeat procedure for second eye (use a clean applicator).    Marland Kitchen escitalopram (LEXAPRO) 20 MG tablet Take 20 mg by mouth daily.    Marland Kitchen glucose blood test strip Check BS three times per day for E11.9 100 each 12  . glucose monitoring kit (FREESTYLE) monitoring kit 1 each by Does not apply route as needed. Check BS three times per day for E11.9 1 each 0  . HYDROcodone-acetaminophen (NORCO/VICODIN) 5-325 MG per tablet Take 1-2 tablets by mouth every 6 hours as needed for pain and/or cough. (Patient not taking: Reported on 07/06/2014) 11 tablet 0  . ibuprofen (ADVIL,MOTRIN) 600 MG tablet Take 1 tablet (600 mg total) by mouth every 6 (six) hours as needed. (Patient not taking: Reported on 07/26/2014) 30 tablet 0  . INVOKAMET [phone] MG TABS TK 1 T PO  BID  0  . Lancets (FREESTYLE) lancets Check BS three times per day for E11.9 100 each 12  . levothyroxine  (SYNTHROID, LEVOTHROID) 175 MCG tablet Take 1 tablet by mouth every evening.  0  . levothyroxine (SYNTHROID, LEVOTHROID) 200 MCG tablet Take 1 tablet (200 mcg total) by mouth daily before breakfast. 30 tablet 3  . omeprazole (PRILOSEC) 20 MG capsule Take 1 capsule (20 mg total) by mouth daily. 30 capsule 3  . risperiDONE (RISPERDAL) 1 MG tablet Take 1 tablet (1 mg total) by mouth daily. 30 tablet 0  . simvastatin (ZOCOR) 40 MG tablet Take 1 tablet (40 mg total) by mouth daily. (Patient not taking: Reported on 08/26/2014) 30 tablet 3  . temazepam (RESTORIL) 15 MG capsule 1  qhs  and if it fails after 2 nights then increase to 2  qhs 60 capsule 3  . traMADol (ULTRAM) 50 MG tablet Take 1 tablet (50 mg  total) by mouth every 6 (six) hours as needed. 15 tablet 0  . traZODone (DESYREL) 100 MG tablet 1  qhs  If  Not sleeping increase to 2 qhs 60 tablet 3   No current facility-administered medications for this visit.    Lab Results: None  Physical Findings: AIMS:  CIWA:    COWS:     Treatment Plan Summary: #1 Psychosis will be treated with Risperdal 1 mg by mouth every at bedtime. Prior authorization will be done today and she'll start the medicine tonight  # 2 continue trazodone 200 mg by mouth daily at bedtime.    #3 patient will call as if she feels unsafe. #4 patient will return to see Dr. Roland Earl in the clinic next week      Medical Decision Making: High Medication management

## 2014-10-13 NOTE — Telephone Encounter (Signed)
Medication management - requested prior authorization for patient's Risperdal with [employer].  Risperdal approved with [id number] from 10/13/14 - 10/08/15.  Left patient a message the Risperdal medication was approved.   Called [facility] to inform the medication was approved and could be filled.

## 2014-10-14 NOTE — Telephone Encounter (Signed)
Yes please. Thanks. 

## 2014-10-14 NOTE — Telephone Encounter (Signed)
Met with Dr. Salem Senate to discuss and called patient back and left a message with request patient try the medication again this night with a little dry food choices, less greasy foods, about an hour before bedtime and if does well to continue.  Instructed patient if continued problems to call us back as she may need to stop medication. Informed if any problems with palpitations, increased heart rate, increased temperature or other symptoms to stop and call us back.  Requested patient call us back on Monday 10/17/14 to see how the medication is working for her as by report this AM her voices have subsided and she did sleep much better with less paranoia the previous night.

## 2014-10-14 NOTE — Telephone Encounter (Signed)
Telephone call with patient this AM as states she took her newly prescribed Risperdal 1mg , one at bedtime the previous night and slept much better from approximately 8:00pm till 4:30am this morning.  Patient reported thoughts much improved with less auditory hallucinations this date but she did wake up nauseated and morning sweating.  States she was feeling better after eating something this morning and laying around awhile.  Reports she did take the medication with chicken and other food the previous night but just wanted to make sure it was okay to continue.  Informed patient this nurse would inform Dr. Salem Senate and call back if any further instruction.  Patient stated plan to stop if occurred again but was happy with improved sleep and improved symptoms.  Patient agreed to eat with taking medication but not items that may be greasy or to overeat.

## 2014-10-19 ENCOUNTER — Ambulatory Visit (INDEPENDENT_AMBULATORY_CARE_PROVIDER_SITE_OTHER): Payer: Self-pay | Admitting: Psychiatry

## 2014-10-19 VITALS — BP 145/80 | HR 83 | Ht 67.0 in | Wt 230.6 lb

## 2014-10-19 DIAGNOSIS — F332 Major depressive disorder, recurrent severe without psychotic features: Secondary | ICD-10-CM

## 2014-10-19 DIAGNOSIS — F331 Major depressive disorder, recurrent, moderate: Secondary | ICD-10-CM

## 2014-10-19 MED ORDER — PERPHENAZINE 4 MG PO TABS: 4.0000 mg | ORAL_TABLET | Freq: Two times a day (BID) | ORAL | Status: DC

## 2014-10-19 NOTE — Progress Notes (Signed)
[facility] MD Progress Note  10/19/2014 5:08 PM Raven [name]  MRN:  [id number] Subjective:  Very depressed Principal Problem: Major depression severe recurrent area Diagnosis:  Major depression severe recurrent Patient Active Problem List   Diagnosis Date Noted  . Severe recurrent major depressive disorder with psychotic features [F33.3] 10/13/2014  . Diabetes mellitus, type 2 [E11.9] 03/03/2014  . Hypothyroidism [E03.9] 03/03/2014   Total Time spent with patient: 30 minutes   Past Medical History:  Past Medical History  Diagnosis Date  . Depression   . Diabetes mellitus without complication   . Anxiety   . Arthritis   . Hyperlipidemia   . Thyroid disease   . Pain     a lot pain in elbows,knees,shoulders,thumbs  . Glaucoma     Past Surgical History  Procedure Laterality Date  . Thyroidectomy    . Eye surgery      Bil   Family History:  Family History  Problem Relation Age of Onset  . Diabetes Mother   . Stroke Mother   . Heart disease Mother   . Cancer Sister   . Alcohol abuse Father   . Colon cancer Father   . Alcohol abuse Sister   . Alcohol abuse Brother   . Drug abuse Brother   . Alcohol abuse Brother   . Drug abuse Brother    Social History:  History  Alcohol Use No     History  Drug Use No    Social History   Social History  . Marital Status: Single    Spouse Name: N/A  . Number of Children: N/A  . Years of Education: N/A   Social History Main Topics  . Smoking status: Current Every Day Smoker -- 0.25 packs/day for 3 years    Types: Cigarettes  . Smokeless tobacco: Never Used  . Alcohol Use: No  . Drug Use: No  . Sexual Activity: No   Other Topics Concern  . Not on file   Social History Narrative   Additional History:    Sleep: Poor  Appetite:  Fair   Assessment: At this time the patient is not doing well. She feels persistently depressed. She's not suicidal. Unfortunately she is experiencing significant psychotic symptoms. She has a  visual image every night of the man in her room. She also experiences hearing voices speaking to her are multiple voices. They're not commanding to her they're not telling her to herself or anybody else. The patient is fearful and frightened and has experiences if somebody is following her. This patient is never been in a psychiatric hospital. Today we had a Gleed discussion about being in the hospital. It seems perfectly appropriately that she comes in but she is not committable. She is resistant to the idea of coming in only because she has a 70 year old son. This is a difficult and complex situation. The patient has problems with alcohol in a couple days ago drank a bottle of wine. The patient is actively involved in going to [place] but clearly is relapsing. The patient is able to function and is able to take care of her son but she acknowledges the hallucinations are gray problem. Recently the voices seem to occur when her Wellbutrin dose was increased from 300 mg 2 a dose of 450 mg. but I do not believe that is the reason for her voices. The patient is off the Wellbutrin and was begun on Risperdal but the 1 mg of Risperdal made her ill. She refuses to  take it. This patient is a Medicaid patient and we do have some options. Clearly I would like to treat this patient in a hospital setting but she is resistant I do not think I can place a hold on her and she is willing to return in 2 days after trying Trilafon for treatment. She also has to make and is willing to try to make arrangements for the care of her 63 year old son. At this time the patient is sleeping. The patient says she thinks people at work are talking about her. The patient has a lot of stigma associated with being ill. We supported her idea that if she came into treatment we would write her out from work. The patient agreed to return to see [place] in 48 hours where at that time we she'll readdress her status and reconsider the possibility of hospitalization.  She's going to try to make arrangements for somebody to take care of her son over the weekend and possibly into the next week should she get admitted on Friday. Again I do not think this patient is suicidal. I do not think her voices are affecting her behavior. The voices do not tell her to herself or anyone else. Unfortunately the IOP program is not available for her.  Musculoskeletal: Strength & Muscle Tone: within normal limits Gait & Station: normal Patient leans: Right   Psychiatric Specialty Exam: Physical Exam  ROS  Blood pressure 145/80, pulse 83, height '5\' 7"'  (1.702 m), weight 230 lb 9.6 oz (104.599 kg).Body mass index is 36.11 kg/(m^2).  General Appearance: Casual  Eye Contact::  Good  Speech:  Clear and Coherent  Volume:  Normal  Mood:  Depressed  Affect:  Congruent  Thought Process:  Coherent  Orientation:  NA  Thought Content:  WDL  Suicidal Thoughts:  No  Homicidal Thoughts:  No  Memory:  NA  Judgement:  Fair  Insight:  Good  Psychomotor Activity:  Normal  Concentration:  Fair  Recall:  [employer] of Knowledge:Fair  Language: Fair  Akathisia:  No  Handed:  Right  AIMS (if indicated):     Assets:  Desire for Improvement  ADL's:  Intact  Cognition: WNL  Sleep:        Current Medications: Current Outpatient Prescriptions  Medication Sig Dispense Refill  . ALPHAGAN P 0.1 % SOLN Place 1 drop into both eyes 3 (three) times daily.  0  . aspirin 81 MG chewable tablet Chew 81 mg by mouth every morning.    . bimatoprost (LATISSE) 0.03 % ophthalmic solution Place into both eyes at bedtime. Place one drop on applicator and apply evenly along the skin of the upper eyelid at base of eyelashes once daily at bedtime; repeat procedure for second eye (use a clean applicator).    Marland Kitchen escitalopram (LEXAPRO) 20 MG tablet Take 20 mg by mouth daily.    Marland Kitchen glucose blood test strip Check BS three times per day for E11.9 100 each 12  . glucose monitoring kit (FREESTYLE) monitoring  kit 1 each by Does not apply route as needed. Check BS three times per day for E11.9 1 each 0  . HYDROcodone-acetaminophen (NORCO/VICODIN) 5-325 MG per tablet Take 1-2 tablets by mouth every 6 hours as needed for pain and/or cough. (Patient not taking: Reported on 07/06/2014) 11 tablet 0  . ibuprofen (ADVIL,MOTRIN) 600 MG tablet Take 1 tablet (600 mg total) by mouth every 6 (six) hours as needed. (Patient not taking: Reported on 07/26/2014) 30 tablet  0  . INVOKAMET [phone] MG TABS TK 1 T PO  BID  0  . Lancets (FREESTYLE) lancets Check BS three times per day for E11.9 100 each 12  . levothyroxine (SYNTHROID, LEVOTHROID) 175 MCG tablet Take 1 tablet by mouth every evening.  0  . levothyroxine (SYNTHROID, LEVOTHROID) 200 MCG tablet Take 1 tablet (200 mcg total) by mouth daily before breakfast. 30 tablet 3  . omeprazole (PRILOSEC) 20 MG capsule Take 1 capsule (20 mg total) by mouth daily. 30 capsule 3  . perphenazine (TRILAFON) 4 MG tablet Take 1 tablet (4 mg total) by mouth 2 (two) times daily. 30 tablet 1  . risperiDONE (RISPERDAL) 1 MG tablet Take 1 tablet (1 mg total) by mouth daily. 30 tablet 0  . simvastatin (ZOCOR) 40 MG tablet Take 1 tablet (40 mg total) by mouth daily. (Patient not taking: Reported on 08/26/2014) 30 tablet 3  . temazepam (RESTORIL) 15 MG capsule 1  qhs  and if it fails after 2 nights then increase to 2  qhs 60 capsule 3  . traMADol (ULTRAM) 50 MG tablet Take 1 tablet (50 mg total) by mouth every 6 (six) hours as needed. 15 tablet 0  . traZODone (DESYREL) 100 MG tablet 1  qhs  If  Not sleeping increase to 2 qhs 60 tablet 3   No current facility-administered medications for this visit.    Lab Results: No results found for this or any previous visit (from the past 48 hour(s)).  Physical Findings: AIMS:  , ,  ,  ,    CIWA:    COWS:     Treatment Plan Summary: At this time the patient will begin on Trilafon 4 mg at night. She was told her this medicine might help her sleep and  help her hallucinations. This patient was scheduled for a 15 minute visit but came 20 minutes late. We clearly went over to try to meet her needs and it took her a while to agree to return to see [place] in 48 hours. We made a special arrangement for her return. The patient is resistant to coming into the hospital. She clearly has some reasonable concerns mainly related to the care of her 46 year old son. She is frightened of losing her job but we are short her that that would not happen. This patient is not acutely suicidal at this time. She amazingly seems to be functioning in that she gets stress goes to work takes care of her 65 year old son but experiences hallucinations and Atteberry's times during the day and at night. This patient she'll return to see me in 48 hours. She was instructed to call or system if there are any problems.   Medical Decision Making:  New problem, with additional work up planned     Raven [name] 10/19/2014, 5:08 PM

## 2014-10-21 ENCOUNTER — Telehealth (HOSPITAL_COMMUNITY): Payer: Self-pay | Admitting: Psychiatry

## 2014-10-21 ENCOUNTER — Ambulatory Visit (HOSPITAL_COMMUNITY): Payer: Self-pay | Admitting: Psychiatry

## 2014-10-21 NOTE — Telephone Encounter (Signed)
D:  Apparently pt canceled her appointment with Dr. Casimiro Needle this a.m.  Dr. Casimiro Needle requested that writer call pt to check on her.  A:  Placed call to pt ([phone]), but there was no answer.  Left vm requesting that she call Dr. Casimiro Needle, this writer or Shawn (RN) as soon as possible.  Informed Dr. Casimiro Needle and Raquel Sarna.

## 2014-10-25 ENCOUNTER — Ambulatory Visit (INDEPENDENT_AMBULATORY_CARE_PROVIDER_SITE_OTHER): Payer: Medicaid Other | Admitting: Clinical

## 2014-10-25 ENCOUNTER — Encounter (HOSPITAL_COMMUNITY): Payer: Self-pay | Admitting: Clinical

## 2014-10-25 DIAGNOSIS — F333 Major depressive disorder, recurrent, severe with psychotic symptoms: Secondary | ICD-10-CM

## 2014-10-25 NOTE — Progress Notes (Signed)
   THERAPIST PROGRESS NOTE  Session Time: 3:45 - 4:35  Participation Level: Active  Behavioral Response: CasualAlertDepressed  Type of Therapy: Individual Therapy  Treatment Goals addressed: improve psychiatric symptoms, improve unhelpful thinking patterns, reduce hallucinations and/or delusions, interpersonal relationship skills   Interventions: CBT, Motivational Interviewing, Grounding and Mindfulness Techniques  Summary: Tiannah L. Hausman  is a 51.y.o. female who presents with Major Depressive Disorder with psychotic features.  Suicidal/Homicidal: No -without intent/plan  Therapist Response:  Arria met with clinician for an individual session. Karra discussed her psychiatric symptoms, her current life events, and her homework. Elliet shared that she had been practicing her grounding and mindfulness techniques. She shared that she finds them helpful. She shared that she was having less hallucinations since the medication change. She cried as she shared her fear that she was afraid hat her past drug use had caused her to have these symptoms. Aysa shared that others in her family also had issues with hallucinations. Client and clinician discussed that genetics and stessors such as past drug use might have contributed. Clinician assured her that no matter how they came about they were not a punishment just something that needed to be dealt with. Clinician assured her she was still a good person and that the combination of medication and therapy could help. 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 Heatherly shared her thoughts about other employment and the possibility of disability. She shared that she liked to work but she didn't know if she could. She shared that she finds it difficult to organize herself. Client and clinician discussed some skills to help her organize. Meranda shared that her son started school  and she would like things to have his home life organized and to engage with him more. She shared that she works to get out of the house with him even though it has been difficult because of her mental health. Client and clinician discussed some stratgies to make it easier. Raul agreed to take her meds as prescribed and to continue her homework until next session  Plan: Return again in 1 weeks.  Diagnosis: Axis I: Severe recurrent major depressive disorder with psychotic features  , A, LCSW 10/25/2014  

## 2014-10-27 ENCOUNTER — Ambulatory Visit (INDEPENDENT_AMBULATORY_CARE_PROVIDER_SITE_OTHER): Payer: Medicaid Other | Admitting: Psychiatry

## 2014-10-27 VITALS — BP 119/79 | HR 98 | Ht 68.0 in | Wt 229.0 lb

## 2014-10-27 DIAGNOSIS — F333 Major depressive disorder, recurrent, severe with psychotic symptoms: Secondary | ICD-10-CM

## 2014-10-27 DIAGNOSIS — F323 Major depressive disorder, single episode, severe with psychotic features: Secondary | ICD-10-CM

## 2014-10-27 MED ORDER — BUPROPION HCL ER (XL) 150 MG PO TB24: ORAL_TABLET | ORAL | Status: DC

## 2014-10-27 MED ORDER — PERPHENAZINE 4 MG PO TABS: ORAL_TABLET | ORAL | Status: DC

## 2014-10-27 NOTE — Progress Notes (Signed)
[facility] MD Progress Note  10/27/2014 2:47 PM Raven [name]  MRN:  [id number] Subjective:  Better Principal Problem: Major depression, recurrent severe with psychotic features Diagnosis:  Major depression, recurrent severe with psychotic features At this time the patient has shown improvement. The patient's voices were all day [place] but now since being on Trilafon she only hears them at night they are negative and condescending. They're not commanding. The patient sees no visions at this time. She is less paranoid. In fact she says she no longer thinks anybody's following her. Unfortunately she had a resigned from work because she thought the clothing was talking to her. At this time the patient does acknowledge persistent daily depression. Her appetite is increased excessively. She is having early morning awakening. The patient is not suicidal. She says she is able to think and concentrate. She is very concerned about taking care of her son. It is noted this patient used to use crack cocaine up until 13 years ago. She asked is called and a hotline and now is going to NA groups twice a week. This is been very helpful for her. She continues seeing a therapist here. Patient Active Problem List   Diagnosis Date Noted  . Severe recurrent major depressive disorder with psychotic features [F33.3] 10/13/2014  . Diabetes mellitus, type 2 [E11.9] 03/03/2014  . Hypothyroidism [E03.9] 03/03/2014   Total Time spent with patient: 30 minutes   Past Medical History:  Past Medical History  Diagnosis Date  . Depression   . Diabetes mellitus without complication   . Anxiety   . Arthritis   . Hyperlipidemia   . Thyroid disease   . Pain     a lot pain in elbows,knees,shoulders,thumbs  . Glaucoma     Past Surgical History  Procedure Laterality Date  . Thyroidectomy    . Eye surgery      Bil   Family History:  Family History  Problem Relation Age of Onset  . Diabetes Mother   . Stroke Mother   . Heart  disease Mother   . Cancer Sister   . Alcohol abuse Father   . Colon cancer Father   . Alcohol abuse Sister   . Alcohol abuse Brother   . Drug abuse Brother   . Alcohol abuse Brother   . Drug abuse Brother    Social History:  History  Alcohol Use No     History  Drug Use No    Social History   Social History  . Marital Status: Single    Spouse Name: N/A  . Number of Children: N/A  . Years of Education: N/A   Social History Main Topics  . Smoking status: Current Every Day Smoker -- 0.25 packs/day for 3 years    Types: Cigarettes  . Smokeless tobacco: Never Used  . Alcohol Use: No  . Drug Use: No  . Sexual Activity: No   Other Topics Concern  . Not on file   Social History Narrative   Additional History:    Sleep: Poor  Appetite:  Poor   Assessment:   Musculoskeletal: Strength & Muscle Tone: within normal limits Gait & Station: normal Patient leans: Right   Psychiatric Specialty Exam: Physical Exam  ROS  Blood pressure 119/79, pulse 98, height 5' 8" (1.727 m), weight 229 lb (103.874 kg).Body mass index is 34.83 kg/(m^2).  General Appearance: Casual  Eye Contact::  Fair  Speech:  Clear and Coherent  Volume:  Normal  Mood:  Depressed  Affect:  Blunt  Thought Process:  Coherent  Orientation:  Full (Time, Place, and Person)  Thought Content:  Hallucinations: Auditory  Suicidal Thoughts:  No  Homicidal Thoughts:  No  Memory:  Negative  Judgement:  Good  Insight:  Good  Psychomotor Activity:  Decreased  Concentration:  Fair  Recall:  Good  Fund of Knowledge:Poor  Language: Poor  Akathisia:  No  Handed:  Right  AIMS (if indicated):     Assets:  Desire for Improvement  ADL's:  Intact  Cognition:  Sleep:        Current Medications: Current Outpatient Prescriptions  Medication Sig Dispense Refill  . ALPHAGAN P 0.1 % SOLN Place 1 drop into both eyes 3 (three) times daily.  0  . aspirin 81 MG chewable tablet Chew 81 mg by mouth every morning.     . bimatoprost (LATISSE) 0.03 % ophthalmic solution Place into both eyes at bedtime. Place one drop on applicator and apply evenly along the skin of the upper eyelid at base of eyelashes once daily at bedtime; repeat procedure for second eye (use a clean applicator).    Marland Kitchen buPROPion (WELLBUTRIN XL) 150 MG 24 hr tablet 3 qam 90 tablet 4  . glucose blood test strip Check BS three times per day for E11.9 100 each 12  . glucose monitoring kit (FREESTYLE) monitoring kit 1 each by Does not apply route as needed. Check BS three times per day for E11.9 1 each 0  . HYDROcodone-acetaminophen (NORCO/VICODIN) 5-325 MG per tablet Take 1-2 tablets by mouth every 6 hours as needed for pain and/or cough. (Patient not taking: Reported on 07/06/2014) 11 tablet 0  . ibuprofen (ADVIL,MOTRIN) 600 MG tablet Take 1 tablet (600 mg total) by mouth every 6 (six) hours as needed. (Patient not taking: Reported on 10/25/2014) 30 tablet 0  . INVOKAMET [phone] MG TABS TK 1 T PO  BID  0  . Lancets (FREESTYLE) lancets Check BS three times per day for E11.9 100 each 12  . levothyroxine (SYNTHROID, LEVOTHROID) 175 MCG tablet Take 1 tablet by mouth every evening.  0  . levothyroxine (SYNTHROID, LEVOTHROID) 200 MCG tablet Take 1 tablet (200 mcg total) by mouth daily before breakfast. 30 tablet 3  . omeprazole (PRILOSEC) 20 MG capsule Take 1 capsule (20 mg total) by mouth daily. 30 capsule 3  . perphenazine (TRILAFON) 4 MG tablet 1 qam   2  qpm 90 tablet 1  . risperiDONE (RISPERDAL) 1 MG tablet Take 1 tablet (1 mg total) by mouth daily. (Patient not taking: Reported on 10/25/2014) 30 tablet 0  . simvastatin (ZOCOR) 40 MG tablet Take 1 tablet (40 mg total) by mouth daily. 30 tablet 3  . temazepam (RESTORIL) 15 MG capsule 1  qhs  and if it fails after 2 nights then increase to 2  qhs 60 capsule 3  . traMADol (ULTRAM) 50 MG tablet Take 1 tablet (50 mg total) by mouth every 6 (six) hours as needed. (Patient not taking: Reported on 10/25/2014)  15 tablet 0  . traZODone (DESYREL) 100 MG tablet 1  qhs  If  Not sleeping increase to 2 qhs 60 tablet 3   No current facility-administered medications for this visit.    Lab Results: No results found for this or any previous visit (from the past 48 hour(s)).  Physical Findings: AIMS:  , ,  ,  ,    CIWA:    COWS:     Treatment Plan Summary: At this  time we she'll go ahead and increase her Trilafon from 4 mg twice a day to taking 4 mg 1 in the morning and 2 at night. This is for her first problem which is auditory hallucinations. This is the most distressing thing for her. She also is experiencing depression and for reasons that are not clear she discontinued her Wellbutrin. She is interested in going back on Wellbutrin and thinks that in fact did have some benefit. She'll go ahead and titrate up to 450 mg over the next week. The patient continue going to NA meetings and will continue coming here for therapy. We will also go ahead and continue her trazodone for sleep. Overall the patient has improved. I do not believe she suicidal and I think she is less psychotic at this time. At this time I do not think she necessarily needs to come into a hospital. She's very committed to her son. At this time she is unemployed. This patient will continue in NA and individual therapy and return to see me in 4 weeks. She was instructed to call our office if anything changed. Overall she is making progress her voices have significantly reduced.   Medical Decision Making:  New problem, with additional work up planned     Haskel Schroeder 10/27/2014, 2:47 PM

## 2014-11-08 ENCOUNTER — Encounter (HOSPITAL_COMMUNITY): Payer: Self-pay | Admitting: Clinical

## 2014-11-08 ENCOUNTER — Telehealth (HOSPITAL_COMMUNITY): Payer: Self-pay

## 2014-11-08 ENCOUNTER — Ambulatory Visit (INDEPENDENT_AMBULATORY_CARE_PROVIDER_SITE_OTHER): Payer: Medicaid Other | Admitting: Clinical

## 2014-11-08 DIAGNOSIS — F323 Major depressive disorder, single episode, severe with psychotic features: Secondary | ICD-10-CM

## 2014-11-08 NOTE — Progress Notes (Signed)
   THERAPIST PROGRESS NOTE  Session Time: 3:30 -4:13  Participation Level: Minimal  Behavioral Response: CasualDrowsyDepressed  Type of Therapy: Individual Therapy  Treatment Goals addressed: improve psychiatric symptoms,   reduce hallucinations and/or delusions,   Interventions:  Motivational Interviewing  Summary: Raven [name]  is [name] 51.y.o. female who presents with Major Depressive Disorder with psychotic features.  Suicidal/Homicidal: No -without intent/plan  Therapist Response:  Raven [name] met with clinician for an individual session. Raven [name] discussed her psychiatric symptoms, her current life events, and her homework. Raven [name] shared that she has not done her homework. Clinician noted that Raven [name] seemed very depressed and lethargic. She shared that she was having trouble sleeping because she was hallucinating that someone was in her house. She shared that her thoughts were very negative and she just didn't feel well. She wept and stated that she was very scared that things would not get better for her mentally. Raven [name] shared that she was unable to organize her home though she wanted to do so. 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 She shared that her hair pulling increased about [name] year and [name] half ago and had become increasingly worse the last few months.Clinician asked if there had been changes in her medication. She shared that there were. Client and clinician agreed to talk to the nurse on there way out to look at her file. Raven [name] shared that while she has again experienced thoughts about death, she has no intention or desire to hurt herself. She shared that she really wants to get better for herself and her son.  Client and clinician stopped by the nurse's office. He was able to contact the doctor and help get Raven [name] assistance with her medications.  Plan: Return again in 1  weeks.  Diagnosis: Axis I: Severe recurrent major depressive disorder with psychotic features    Raven Fabiano A, LCSW 11/08/2014

## 2014-11-08 NOTE — Telephone Encounter (Signed)
Medication management - Met with patient with Raven [name], therapist to assess patient's status with reported increased + auditory and visual hallucinations again since returned increase in Wellbutrin.  Pt. admitted had not began Trilafon increase from evaluation with Dr. Casimiro Needle 10/27/14 but did return to Wellbutrin XL 484m a day.  Requested patient take increase in Trilafon as Dr. PCasimiro Needleincreased with Trazodone tonight and to check back in with this nurse on 11/09/14 to see if any improvement.  Patient denies any suicidal or homicidal ideations but reports increase +AH/VH.  Patient agreed with plan to call in the AM to see if any improvement with Trilafon and if needed would work patient into clinic to see Dr. PCasimiro Needleagain on 11/09/14 evening.  Patient denies any danger to self or others at this time and agrees with plan.

## 2014-11-09 NOTE — Telephone Encounter (Signed)
Medication management - Telephone call with pt after discussing with Dr. Casimiro Needle her status 11/08/14 and reported improvement today.  Requested pt. stay on incresed dosage of Trilafon, keep Wellbutrin the same for now but to call back if symptoms return.  Patient agreed with plan as reported doing much better today and denies any SI/HI and no auditory or visual hallucinations and no more episodes of pulling her hair or anxiety.  Patient will call back if any return to increased symptoms.

## 2014-11-09 NOTE — Telephone Encounter (Signed)
Medication management - Called patient back after she left a message she took incresed Trilafon as directed 11/08/14 and slept better with noted decrease in auditory and visual hallucinations.  Pt. reports she has been better today and not sure if needs to go down on Wellbutrin?  Reports no thoughts of wanting to harm herself or others and was more talkative and less guarded with conversation this date.  Agreed to inform Dr. Casimiro Needle of her status and of recent problem with some noted trichotillomania.

## 2014-11-23 ENCOUNTER — Ambulatory Visit (INDEPENDENT_AMBULATORY_CARE_PROVIDER_SITE_OTHER): Payer: Medicaid Other | Admitting: Psychiatry

## 2014-11-23 VITALS — BP 138/84 | HR 74 | Ht 68.0 in | Wt 221.8 lb

## 2014-11-23 DIAGNOSIS — F323 Major depressive disorder, single episode, severe with psychotic features: Secondary | ICD-10-CM

## 2014-11-23 NOTE — Progress Notes (Signed)
[facility] MD Progress Note  11/23/2014 4:29 PM Raven [name]  MRN:  [id number] Subjective:  Better Principal Problem: Major depression, recurrent with psychotic features Diagnosis:  Major depression, recurrent with psychotic features Today the patient is somewhat better. Her depression is not every day but rather more like 3 days a week. Once again the patient is not taking her medicines appropriately. She is only taking 150 mg of Wellbutrin. Fortunately most of her voices are gone at least during the day. At night she hears to soft voices that seem like it somewhat else's voice. They're not commanding or demanding. The patient says her urinary is much better. Her appetite is changed to being more normal. She continues to have interrupted sleep which I think affects her during the day. I suspect it affects her energy and motivation. The patient says she can concentrate only fairly well. She denies being suicidal. She denies using any cocaine or any other drugs. The patient is not going to NA regularly. She does not track any alcohol. She continues in therapy in the setting. Her son seems to be doing fairly well as she goes to youth focus. 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 The patient has an appointment tomorrow with vocational rehabilitation to again look for job. The patient has a blemish on her record 10 years ago being charged with to collect over 6 her old son that time. At that time she was using crack cocaine. 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 Because she has that on her record she is unable to work with young children which is what she would really like to do. Ideally she would like to be a Pharmacist, hospital. She's actually gone at college and is only a number of credits away from getting a teaching degree.  The patient is unmotivated and I think she's not completely treated optimally with her medications yet. For reasons that are not clear she claims the Trilafon when she took 2 of them at night it kept her up. This is an unusual outcome. Once again the patient denies being suicidal. Patient Active Problem List   Diagnosis Date Noted  . Severe recurrent major depressive disorder with psychotic features [F33.3] 10/13/2014  . Diabetes mellitus, type 2 [E11.9] 03/03/2014  . Hypothyroidism [E03.9] 03/03/2014   Total Time spent with patient: 30 minutes   Past Medical History:  Past Medical History  Diagnosis Date  . Depression   . Diabetes mellitus without complication   . Anxiety   . Arthritis   . Hyperlipidemia   . Thyroid disease   . Pain     a lot pain in elbows,knees,shoulders,thumbs  . Glaucoma     Past Surgical History  Procedure Laterality Date  . Thyroidectomy    . Eye surgery      Bil   Family History:  Family History  Problem Relation Age of Onset  . Diabetes Mother   . Stroke Mother   . Heart disease Mother   . Cancer Sister   . Alcohol abuse Father   . Colon cancer Father   . Alcohol abuse Sister   . Alcohol abuse Brother   . Drug abuse Brother   . Alcohol abuse Brother   . Drug abuse Brother  Social History:  History  Alcohol Use No     History  Drug Use No    Social History   Social History  . Marital Status: Single    Spouse Name: N/A  . Number of Children: N/A  . Years of Education: N/A   Social History Main Topics  . Smoking status: Current Every Day Smoker -- 0.25 packs/day for 3 years    Types: Cigarettes  . Smokeless tobacco: Never Used  . Alcohol Use: No  . Drug Use: No  . Sexual Activity: No   Other Topics Concern  . Not on file   Social History Narrative   Additional History:    Sleep: Fair  Appetite:  Good   Assessment:   Musculoskeletal: Strength & Muscle Tone: within normal limits Gait & Station: normal Patient  leans: Right   Psychiatric Specialty Exam: Physical Exam  ROS  Blood pressure 138/84, pulse 74, height 5' 8" (1.727 m), weight 221 lb 12.8 oz (100.608 kg).Body mass index is 33.73 kg/(m^2).  General Appearance: Casual  Eye Contact::  Good  Speech:  Normal Rate  Volume:  Normal  Mood:  Euthymic  Affect:  Flat  Thought Process:  Coherent  Orientation:  Full (Time, Place, and Person)  Thought Content:  WDL  Suicidal Thoughts:  No  Homicidal Thoughts:  No  Memory:  NA  Judgement:  Good  Insight:  Fair  Psychomotor Activity:  Normal  Concentration:  Fair  Recall:  Good  Fund of Knowledge:Good  Language: Fair  Akathisia:  No  Handed:  Right  AIMS (if indicated):     Assets:  Desire for Improvement  ADL's:  Intact  Cognition: WNL  Sleep:        Current Medications: Current Outpatient Prescriptions  Medication Sig Dispense Refill  . ALPHAGAN P 0.1 % SOLN Place 1 drop into both eyes 3 (three) times daily.  0  . aspirin 81 MG chewable tablet Chew 81 mg by mouth every morning.    . bimatoprost (LATISSE) 0.03 % ophthalmic solution Place into both eyes at bedtime. Place one drop on applicator and apply evenly along the skin of the upper eyelid at base of eyelashes once daily at bedtime; repeat procedure for second eye (use a clean applicator).    Marland Kitchen buPROPion (WELLBUTRIN XL) 150 MG 24 hr tablet 3 qam 90 tablet 4  . glucose blood test strip Check BS three times per day for E11.9 100 each 12  . glucose monitoring kit (FREESTYLE) monitoring kit 1 each by Does not apply route as needed. Check BS three times per day for E11.9 1 each 0  . HYDROcodone-acetaminophen (NORCO/VICODIN) 5-325 MG per tablet Take 1-2 tablets by mouth every 6 hours as needed for pain and/or cough. (Patient not taking: Reported on 07/06/2014) 11 tablet 0  . ibuprofen (ADVIL,MOTRIN) 600 MG tablet Take 1 tablet (600 mg total) by mouth every 6 (six) hours as needed. (Patient not taking: Reported on 10/25/2014) 30 tablet 0   . INVOKAMET [phone] MG TABS TK 1 T PO  BID  0  . Lancets (FREESTYLE) lancets Check BS three times per day for E11.9 100 each 12  . levothyroxine (SYNTHROID, LEVOTHROID) 175 MCG tablet Take 1 tablet by mouth every evening.  0  . levothyroxine (SYNTHROID, LEVOTHROID) 200 MCG tablet Take 1 tablet (200 mcg total) by mouth daily before breakfast. 30 tablet 3  . omeprazole (PRILOSEC) 20 MG capsule Take 1 capsule (20 mg total) by mouth daily. [place]  capsule 3  . perphenazine (TRILAFON) 4 MG tablet 1 qam   2  qpm 90 tablet 1  . simvastatin (ZOCOR) 40 MG tablet Take 1 tablet (40 mg total) by mouth daily. 30 tablet 3  . temazepam (RESTORIL) 15 MG capsule 1  qhs  and if it fails after 2 nights then increase to 2  qhs 60 capsule 3  . traMADol (ULTRAM) 50 MG tablet Take 1 tablet (50 mg total) by mouth every 6 (six) hours as needed. (Patient not taking: Reported on 10/25/2014) 15 tablet 0  . traZODone (DESYREL) 100 MG tablet 1  qhs  If  Not sleeping increase to 2 qhs 60 tablet 3   No current facility-administered medications for this visit.    Lab Results: No results found for this or any previous visit (from the past 48 hour(s)).  Physical Findings: AIMS:  , ,  ,  ,    CIWA:    COWS:     Treatment Plan Summary: At this time we will adjust her medications. She is only taking one Wellbutrin in the morning and improved he shorts to 150. In the past notes she was supposed to increase it to taking 3 in the morning. At this time I've asked her to increase to taking 2 every morning for one week and then increase it to 3 fold dose. She'll continue taking Trilafon 4 mg 1 twice a day we'll ask her to increase her trazodone to the dose that was supposed to be at 100 mg taking 2 at night she wasn't sleeping. Ideally if she sleeping better her first problem that of psychotic depression be better. Her first problem is psychotic depression where we will adjust her antidepression. Her second is unemployment. The patient has  a plan to be seen in vocational rehabilitation tomorrow. The patient will continue in one-to-one therapy at the Center and perhaps look into the possibility of getting her record adjusted since she so done so well the last decade. This patient she'll return to see me in 6 weeks   Medical Decision Making:  Self-Limited or Minor (1)     PLOVSKY, GERALD IRVING 11/23/2014, 4:29 PM

## 2014-12-08 ENCOUNTER — Ambulatory Visit (INDEPENDENT_AMBULATORY_CARE_PROVIDER_SITE_OTHER): Payer: Medicaid Other | Admitting: Clinical

## 2014-12-08 DIAGNOSIS — F333 Major depressive disorder, recurrent, severe with psychotic symptoms: Secondary | ICD-10-CM

## 2014-12-08 NOTE — Progress Notes (Signed)
   THERAPIST PROGRESS NOTE  Session Time: 9:03 -9:58  Participation Level: Active  Behavioral Response: CasualAlertDepressed  Type of Therapy: Individual Therapy  Treatment Goals addressed: improve psychiatric symptoms, improve unhelpful thinking patterns, reduce hallucinations and/or delusions, interpersonal relationship skills   Interventions: CBT and Motivational Interviewing,  Summary: Raven [name]  is a 51.y.o. female who presents with Major Depressive Disorder with psychotic features.  Suicidal/Homicidal: No -without intent/plan  Therapist Response:  Raven Rowan met with clinician for an individual session. Raven [name] discussed her psychiatric symptoms and her current life events. Raven [name] shared that her psychiatric symptoms were improving. She shared that she was not having thoughts of death and that the hallucinations were decreasing. Raven [name] shared that in addition she had been able to make so very small progress in straightening her house. Raven [name] shared that she is still waking up fearful that there is someone in her house. Raven [name] shared that it was less but still present. Clinician encouraged her to let her psychiatrist know. Client and clinician discussed techniques she could use to calm herself after she recognized that she was safe. Client and clinician discussed grounding techniques. She stated that it has really been a struggle fighting her mental illness and trying to get herself "together" for her son.  She shared that she had her first meeting with her son's school and the report was positive. She shared that this was a relief to her.  She stated that she had stood in line for several hours trying to get Christmas assistance for her son who is 54. She cried as she shared her sadness at being turned down ( he is too old for some programs). Clinician gave her a link that list resources and agencies that provide assistance in the area. She stated she would call some of the others to see if  they would help. She also shared that she has been trying to find employment. Raven [name] shared that she found it challenging to interact with others when her symptoms were active. Raven [name] explained some of her negative automatic thoughts. Client and clinician discussed the evidence for and against the negative automatic thoughts. Raven [name] was able to formulate some healthier alternative thoughts. Raven [name] agreed to practice her grounding and mindfulness techniques until next session.  Plan: Return again in 1 weeks.  Diagnosis: Axis I: Severe recurrent major depressive disorder with psychotic features   Jo Cerone A, LCSW 12/08/2014

## 2014-12-13 ENCOUNTER — Encounter (HOSPITAL_COMMUNITY): Payer: Self-pay | Admitting: Clinical

## 2014-12-19 ENCOUNTER — Other Ambulatory Visit: Payer: Self-pay | Admitting: Nurse Practitioner

## 2014-12-19 DIAGNOSIS — R319 Hematuria, unspecified: Secondary | ICD-10-CM

## 2014-12-20 ENCOUNTER — Ambulatory Visit
Admission: RE | Admit: 2014-12-20 | Discharge: 2014-12-20 | Disposition: A | Discharge status: Home / Self Care | Payer: Medicaid Other | Source: Ambulatory Visit | Attending: Nurse Practitioner | Admitting: Nurse Practitioner

## 2014-12-20 DIAGNOSIS — R319 Hematuria, unspecified: Secondary | ICD-10-CM

## 2014-12-22 ENCOUNTER — Encounter (HOSPITAL_COMMUNITY): Payer: Self-pay | Admitting: Clinical

## 2014-12-22 ENCOUNTER — Ambulatory Visit (INDEPENDENT_AMBULATORY_CARE_PROVIDER_SITE_OTHER): Payer: Self-pay | Admitting: Clinical

## 2014-12-22 DIAGNOSIS — F333 Major depressive disorder, recurrent, severe with psychotic symptoms: Secondary | ICD-10-CM

## 2014-12-22 NOTE — Progress Notes (Signed)
   THERAPIST PROGRESS NOTE  Session Time: 10:15 - 10:59  Participation Level: Active  Behavioral Response: CasualAlertDepressed  Type of Therapy: Individual Therapy  Treatment Goals addressed: improve psychiatric symptoms, improve unhelpful thinking patterns, reduce hallucinations and/or delusions, interpersonal relationship skills   Interventions: CBT, Motivational Interviewing, Grounding and Mindfulness Techniques  Summary: Raven [name]  is [name] 51.y.o. female who presents with Major Depressive Disorder with psychotic features.  Suicidal/Homicidal: No -without intent/plan  Therapist Response:  Raven [name] met with clinician for an individual session. Raven [name] discussed her psychiatric symptoms, her current life events, and her homework. Raven [name] shared that she had been doing [name] little bit better emotionally, but found blood in her urine Sunday. She shared that this concerned her and was seeking medical attention to figure out what is going on with her. She shared that she continues to sleep with her lights on because while her hallucinations have decreased she still sometimes awakes thinking there is someone in her house. Raven [name] shared that she applied for some jobs. She shared that she wants to work and she also voiced her concerns about being able to work. Raven [name] works very hard to provide [name] good home for her son. She shared things she does to help them stay on schedule. She shared about taking him to [employer] and the youth club She shared about wanting to move due to some issues with her housing but not being able to move because the other housing available is in drug areas and she is aware he would be subseptale because of his emotional issues. Clinician supported her efforts and repeated back to her the positive things she does for her and her son. Raven [name] and clinician discussed her hallucination. Client and clinician discussed the factors that make them more active - stress and inability to fall  back asleep. Clinician introduced [name] mindfulness technique. Clinician explained the technique to Raven [name] and then client and clinician practiced the technique together. Clinician had Raven [name] explain the technique back to clinician. Client and clinician discussed how she could use the technique to help her relax and also return to sleep when awoke by hallucinations. Raven [name] shared that she has been practicing her grounding and mindfulness technique and will continue to do so until next session.   Plan: Return again in 1 weeks.  Diagnosis: Axis I: Severe recurrent major depressive disorder with psychotic features    Raven Childers A, LCSW 12/22/2014

## 2015-01-03 HISTORY — PX: COLONOSCOPY: SHX174

## 2015-01-06 ENCOUNTER — Ambulatory Visit (INDEPENDENT_AMBULATORY_CARE_PROVIDER_SITE_OTHER): Payer: Self-pay | Admitting: Psychiatry

## 2015-01-06 VITALS — BP 119/79 | HR 87 | Resp 12 | Wt 218.6 lb

## 2015-01-06 DIAGNOSIS — F333 Major depressive disorder, recurrent, severe with psychotic symptoms: Secondary | ICD-10-CM

## 2015-01-06 MED ORDER — BUPROPION HCL ER (XL) 150 MG PO TB24: ORAL_TABLET | ORAL | Status: DC

## 2015-01-06 MED ORDER — ARIPIPRAZOLE 5 MG PO TABS: 5.0000 mg | ORAL_TABLET | Freq: Every day | ORAL | Status: DC

## 2015-01-06 MED ORDER — FLURAZEPAM HCL 30 MG PO CAPS: 30.0000 mg | ORAL_CAPSULE | Freq: Every evening | ORAL | Status: DC | PRN

## 2015-01-06 NOTE — Progress Notes (Signed)
[facility] MD Progress Note  01/06/2015 11:40 AM KIMBERLYN QUIOCHO  MRN:  [id number] Subjective:  Not happy Principal Problem:  Major depression , recurrent,  moderate Diagnosis:   Maj. Depression, recurrent, moderate  At this time the patient is still having trouble finding employment. She is now volunteering the [employer] which is great. The patient is actively working with vocational rehabilitation. Auditory hallucinations seem to be actually much less yet in fact once again she is not taking the prescribed Trilafon. She is however taking the high-dose Wellbutrin 450 mg which she says is helpful. She says it does give her energy because it is improved her mood and her energy she's not getting up out of bed and getting out. The patient takes care of her son Darlyn Chamber looks like he is doing well enough that is going to come back from the focus go back and pelvic school system. She is pleased about that. The patient's biggest complaint is that she's not sleeping. Is been present for a number of months and  Has not responded to 300 mg of trazodone. The patient's depression is moderately worse. Was 3 times a week now she says 4 days a week she feels depressed. Still not as bad as it was in the past. She is not acutely suicidal. She's being 5 suicide attempts in her life but at this time suicide is not an issue she has to be here for her son. The patient says she is losing weight yet she claims she's eating well. Patient did have some blood in her urine this is being evaluated at this time by urologist. The patient continues coming for therapy. The patient's concentration is only fairly well. This time she denies the use of alcohol or drugs. Patient Active Problem List   Diagnosis Date Noted  . Severe recurrent major depressive disorder with psychotic features ([place]) [F33.3] 10/13/2014  . Diabetes mellitus, type 2 ([place]) [E11.9] 03/03/2014  . Hypothyroidism [E03.9] 03/03/2014   Total Time spent with patient: 30  minutes  Past Psychiatric History:   Past Medical History:  Past Medical History  Diagnosis Date  . Depression   . Diabetes mellitus without complication ([place])   . Anxiety   . Arthritis   . Hyperlipidemia   . Thyroid disease   . Pain     a lot pain in elbows,knees,shoulders,thumbs  . Glaucoma     Past Surgical History  Procedure Laterality Date  . Thyroidectomy    . Eye surgery      Bil   Family History:  Family History  Problem Relation Age of Onset  . Diabetes Mother   . Stroke Mother   . Heart disease Mother   . Cancer Sister   . Alcohol abuse Father   . Colon cancer Father   . Alcohol abuse Sister   . Alcohol abuse Brother   . Drug abuse Brother   . Alcohol abuse Brother   . Drug abuse Brother    Family Psychiatric  History:  Social History:  History  Alcohol Use No     History  Drug Use No    Social History   Social History  . Marital Status: Single    Spouse Name: N/A  . Number of Children: N/A  . Years of Education: N/A   Social History Main Topics  . Smoking status: Current Every Day Smoker -- 0.25 packs/day for 3 years    Types: Cigarettes  . Smokeless tobacco: Never Used  . Alcohol Use:  No  . Drug Use: No  . Sexual Activity: No   Other Topics Concern  . Not on file   Social History Narrative   Additional Social History:                         Sleep: Poor  Appetite:  Fair  Current Medications: Current Outpatient Prescriptions  Medication Sig Dispense Refill  . ALPHAGAN P 0.1 % SOLN Place 1 drop into both eyes 3 (three) times daily.  0  . ARIPiprazole (ABILIFY) 5 MG tablet Take 1 tablet (5 mg total) by mouth daily. 30 tablet 4  . aspirin 81 MG chewable tablet Chew 81 mg by mouth every morning.    . bimatoprost (LATISSE) 0.03 % ophthalmic solution Place into both eyes at bedtime. Place one drop on applicator and apply evenly along the skin of the upper eyelid at base of eyelashes once daily at bedtime; repeat procedure  for second eye (use a clean applicator).    Marland Kitchen buPROPion (WELLBUTRIN XL) 150 MG 24 hr tablet 3 qam 90 tablet 4  . flurazepam (DALMANE) 30 MG capsule Take 1 capsule (30 mg total) by mouth at bedtime as needed for sleep. 30 capsule 0  . glucose blood test strip Check BS three times per day for E11.9 100 each 12  . glucose monitoring kit (FREESTYLE) monitoring kit 1 each by Does not apply route as needed. Check BS three times per day for E11.9 1 each 0  . HYDROcodone-acetaminophen (NORCO/VICODIN) 5-325 MG per tablet Take 1-2 tablets by mouth every 6 hours as needed for pain and/or cough. (Patient not taking: Reported on 07/06/2014) 11 tablet 0  . ibuprofen (ADVIL,MOTRIN) 600 MG tablet Take 1 tablet (600 mg total) by mouth every 6 (six) hours as needed. (Patient not taking: Reported on 10/25/2014) 30 tablet 0  . INVOKAMET 213 [id number] 8666 MG TABS TK 1 T PO  BID  0  . Lancets (FREESTYLE) lancets Check BS three times per day for E11.9 100 each 12  . levothyroxine (SYNTHROID, LEVOTHROID) 175 MCG tablet Take 1 tablet by mouth every evening.  0  . levothyroxine (SYNTHROID, LEVOTHROID) 200 MCG tablet Take 1 tablet (200 mcg total) by mouth daily before breakfast. 30 tablet 3  . omeprazole (PRILOSEC) 20 MG capsule Take 1 capsule (20 mg total) by mouth daily. 30 capsule 3  . simvastatin (ZOCOR) 40 MG tablet Take 1 tablet (40 mg total) by mouth daily. 30 tablet 3  . temazepam (RESTORIL) 15 MG capsule 1  qhs  and if it fails after 2 nights then increase to 2  qhs 60 capsule 3  . traMADol (ULTRAM) 50 MG tablet Take 1 tablet (50 mg total) by mouth every 6 (six) hours as needed. (Patient not taking: Reported on 10/25/2014) 15 tablet 0  . traZODone (DESYREL) 100 MG tablet 1  qhs  If  Not sleeping increase to 2 qhs 60 tablet 3   No current facility-administered medications for this visit.    Lab Results: No results found for this or any previous visit (from the past 48 hour(s)).  Physical Findings: AIMS:  , ,  ,  ,    CIWA:     COWS:     Musculoskeletal: Strength & Muscle Tone: within normal limits Gait & Station: normal Patient leans: Right  Psychiatric Specialty Exam: ROS  Blood pressure 119/79, pulse 87, resp. rate 12, weight 218 lb 9.6 oz (99.156 kg).Body mass index is 33.25 kg/(m^2).  General Appearance: Casual  Eye Contact::  Poor  Speech:  Clear and Coherent  Volume:  Normal  Mood:  Depressed  Affect:  Blunt  Thought Process:  Coherent  Orientation:  Full (Time, Place, and Person)  Thought Content:  WDL  Suicidal Thoughts:  No  Homicidal Thoughts:  No  Memory:  NA  Judgement:  Good  Insight:  Fair  Psychomotor Activity:  Normal  Concentration:  Good  Recall:  Good  Fund of Knowledge:Good  Language: Fair  Akathisia:  No  Handed:  Right  AIMS (if indicated):     Assets:  Desire for Improvement  ADL's:  Intact  Cognition: WNL  Sleep:      Treatment Plan Summary:  the patient's #1 problem directly seems to be depression and now she is taking higher dose of Wellbutrin and does seem to be helpful. I think much of her stresses directly related to being unemployed. I think once employed patient's mood be dramatically improved. I think the Wellbutrin has been beneficial and would not change. I will help the patient sleep better. I believe the trazodone has failed believe she is trying Restoril past with little success. Today we'll go ahead and start her on Dalmane 30 mg at night. Serzone problem is poor sleep. Patient will continue in one-to-one therapy. The patient will continue working with vocational rehabilitation. Patient also is working with legal team to try to get her  Past charges removed so that she can wrap slip for a better more appropriate job for herself. At this time the patient is fairly stable. She is functioning she is getting up and she is still looking for a job.  Muhanad Torosyan, [place] 01/06/2015, 11:40 AM

## 2015-01-10 ENCOUNTER — Ambulatory Visit (INDEPENDENT_AMBULATORY_CARE_PROVIDER_SITE_OTHER): Payer: Self-pay | Admitting: Clinical

## 2015-01-10 ENCOUNTER — Encounter (HOSPITAL_COMMUNITY): Payer: Self-pay | Admitting: Clinical

## 2015-01-10 DIAGNOSIS — F333 Major depressive disorder, recurrent, severe with psychotic symptoms: Secondary | ICD-10-CM

## 2015-01-10 NOTE — Progress Notes (Signed)
   THERAPIST PROGRESS NOTE  Session Time: 11:01   -11:55  Participation Level: Active  Behavioral Response: CasualAlertDepressed  Type of Therapy: Individual Therapy  Treatment Goals addressed: improve psychiatric symptoms, improve unhelpful thinking patterns, interpersonal relationship skills   Interventions: CBT, Motivational Interviewing,   Summary: Raven [name] L. Barb  is a 53.y.o. female who presents with Major Depressive Disorder with psychotic features.  Suicidal/Homicidal: No -without intent/plan  Therapist Response:  Raven [name] met with clinician for an individual session. Erandy discussed her psychiatric symptoms, her current life events, and her homework. Brylinn shared she practiced her grounding and mindfulness techniques and has found them helpful. Raven [name] stated that she had some of her medications changed but that it is too soon to tell if there is a difference. She shared that she is still struggling with depression though the hallucination have been less. She shared that she continues to look for work. She shared that one place she applied asked her to explain her legal history. She brought with her to therapy the letter she wrote explaining, she shared the letter she wrote with clinician. She shared that she had gotten lost in drugs, made poor choices and has worked very hard to create a better life for her and her children. Raven [name] shared her concern about finding work. Client and clinician discussed her efforts and her belief that she needs to continue for herself and her son. Clinician asked [name] to list some of her strengths - trying again, kindness, working to be a good mother, loving, etc. Client and clinician discussed how Raven [name] has been putting her strengths into action. [name] shared not only about looking for work, she shared about building relationships with her children (she has only been able to raise the one that lives with her now). Raven [name] shared how she was able to  maintain those relationships. Client and clinician discussed skills she could use to continue to build those relationships. She shared she had been worrying a lot about Thanksgiving and Christmas with her son. She had decided to tell him that it was unlikely she would have gifts for him. She had approached several agencies and because he is 26 he is too old for assistance. She shared that he came up with a solution of selling some old video games so they could get something and that made her proud of him. She shared that by being honest with him  (though she wants to give him presents) she felt loved and appreciated and she loved and appreciated him. She shared thinking about this made her motivated to continue searching for work and doing what she can.   Plan: Return again in 1 weeks.  Diagnosis: Axis I: Severe recurrent major depressive disorder with psychotic features    Raven [name] A, LCSW 01/10/2015

## 2015-01-25 ENCOUNTER — Encounter (HOSPITAL_COMMUNITY): Payer: Self-pay | Admitting: Clinical

## 2015-01-25 ENCOUNTER — Telehealth (HOSPITAL_COMMUNITY): Payer: Self-pay

## 2015-01-25 ENCOUNTER — Ambulatory Visit (INDEPENDENT_AMBULATORY_CARE_PROVIDER_SITE_OTHER): Payer: Self-pay | Admitting: Clinical

## 2015-01-25 DIAGNOSIS — F333 Major depressive disorder, recurrent, severe with psychotic symptoms: Secondary | ICD-10-CM

## 2015-01-25 NOTE — Progress Notes (Signed)
   THERAPIST PROGRESS NOTE  Session Time: 2:34 -3:29  Participation Level: Active  Behavioral Response: CasualAlertAnxious and Depressed  Type of Therapy: Individual Therapy  Treatment Goals addressed: improve psychiatric symptoms, improve unhelpful thinking patterns, reduce hallucinations and/or delusions, interpersonal relationship skills   Interventions: CBT, Motivational Interviewing, Grounding and Mindfulness Techniques  Summary: Raven [name]  is a 53.y.o. female who presents with Major Depressive Disorder with psychotic features.  Suicidal/Homicidal: No -without intent/plan  Therapist Response: Hassan Rowan met with clinician for an individual session. Raven [name] discussed her psychiatric symptoms and her current life events. Raven [name] shared that she was not selected for the job she applied for. Hassan Rowan stated that her sponsor came down from [place] and stayed with her for 3 days helped her clean her house and take care of herself. Raven [name] stated that she did not relapse. She shared that since hearing that she wasn't hired she has been having increased anxiety and trouble sleeping and increased hallucinations. Raven [name] shared that this setback had caused her trouble at home her son was upset that their routine had been broken because she wasn't able to get up and out of bed. Raven [name] stated that she working to get back in routine because it was important for her son and their relationship.  She shared that she had her part-time job but at the [employer] but it wasn't enough to cover her bills. Client and clinician discussed her negative automatic thoughts about not getting the job. She shared that she took not getting the job pretty personal. She stated that it brought up a lot of fears that she would never get hired. Raven [name]'s is negative automatic thoughts had to do with that she was not likable,that she wasn't hired because she was overweight, and that she did not deserve a second chance.   Client  And clinician discussed the evidence for and against her at thoughts.  But when examining the evidence for and against the thoughts Raven [name] shared that she had an interview and the woman really liked her and was hoping she could hire her.  Raven [name] had the insight that she deserved a second chance, that she has worked hard to be deserving of a second chance, regardless of whether it was seen in this case.Raven [name] had the insight that she could apply for other jobs, that she could get professional assistance in making sure her resume and cover letter were better. 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   Raven [name] stated that she was going to continue applying for other jobs this week.    Plan: Return again in 1 weeks.  Diagnosis: Axis I: Severe recurrent major depressive disorder with psychotic features    Katy Brickell A, LCSW 01/25/2015

## 2015-01-25 NOTE — Telephone Encounter (Signed)
Met with patient, in to see Raven [name], therapist today due to reported concern patient having continued problems with sleep, anxiety and positive auditory hallucinations.  Reviewed patient's medications with her as she reported she has not been able to pick up Dalmane 34m one at bedtime as needed for sleep yet and states Trazodone not working even as she has increased to 3037mat bedtime herself and Restoril.  Patient admits positive auditory hallucinations but denies current suicidal or homicidal ideations.  Admits more anxious and depressed due to recently not getting a job she was thinking she would get.  Patient stated plan to pick up medication and begin it tonight along with continued daily use of Abilify and Wellbutrin XL.  Patient agreed to call this nurse back in the coming week to inform if helping with sleep and if any continued problems.  [facility] if began to have any suicidal ideation, homicidal ideations with plans to harm self or others.  Patient denied any of this at this time and will call prn.  Patient to pick up Dalmane to begin this night to assist with sleep.

## 2015-02-20 ENCOUNTER — Ambulatory Visit (INDEPENDENT_AMBULATORY_CARE_PROVIDER_SITE_OTHER): Payer: Self-pay | Admitting: Clinical

## 2015-02-20 ENCOUNTER — Telehealth (HOSPITAL_COMMUNITY): Payer: Self-pay

## 2015-02-20 DIAGNOSIS — F333 Major depressive disorder, recurrent, severe with psychotic symptoms: Secondary | ICD-10-CM

## 2015-02-20 MED ORDER — HALOPERIDOL 2 MG PO TABS: 2.0000 mg | ORAL_TABLET | Freq: Every day | ORAL | Status: DC

## 2015-02-20 NOTE — Telephone Encounter (Signed)
Met with patient with Raven [name], therapist who reported patient had been experiencing episodes of doing strange things at night when taking Dalmane with Tramadol and other medications.  Patient reported putting her phone in the freezer and other strange behaviors so was taking every other day but did not like the way it was working.  Discussed with Dr. Arfeen as patient has a history of positive auditory hallucinations and paranoia.  Verified all current medication as Dr. Arfeen reviewed with patient and ordered Haldol 2 mg, one at bedtime.  Patient to also discontinue Dalmane and new one time order for Haldol e-scribed to patient's [facility] on [address]  Patient to call on 02/21/15 to report how the medication works this night and warned if has any reactions such as stiffness, muscle contractures, etc.to try OTC Benadryl but to contact us back about any problems before taking more.  Patient agreed with plan and clear about stopping Dalmane.   

## 2015-02-20 NOTE — Progress Notes (Signed)
   THERAPIST PROGRESS NOTE  Session Time: 3:30 - 4:35  Participation Level: Active  Behavioral Response: CasualAlertDepressed  Type of Therapy: Individual Therapy  Treatment Goals addressed: improve psychiatric symptoms, improve unhelpful thinking patterns, reduce hallucinations and/or delusions, interpersonal relationship skills   Interventions: CBT, Motivational Interviewing, Grounding and Mindfulness Techniques  Summary: Raven [name]  is a 14.y.o. female who presents with Major Depressive Disorder with psychotic features.  Suicidal/Homicidal: No -without intent/plan  Therapist Response:  Hassan Rowan met with clinician for an individual session. [name] discussed her psychiatric symptoms and her current life events. [name] shared that she was having trouble with her medication. She shared that it was causing her to have strange behaviors and was not helping her. Client and clinician walked to the nurses office and shared with him what was going on. Nurse consulted a doctor and formulated a new medication plan. Hassan Rowan and clinician then discussed her current life events. [name] shared that her part time work had provided enough income for her to afford a basketball and football and some outfits for her son. She felt very proud about this because she thought that she would not be able to buy him anything. She shared that while she continues to struggle with her own mental health symptoms she is working diligently to provide for him the best she can. Client and clinician discussed her efforts and areas she would like to improve. [name] shared that she applied for a job at [employer] and was hopeful that she will get it. 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 Client and clinician discussed her persistence as an admirable strength.She agreed to continue practicing her grounding techniques. She shared with clinician that she and her son practice them together and that they are helpful.  Plan: Return again in 1  weeks.  Diagnosis: Axis I: Severe recurrent major depressive disorder with psychotic features    [name], LCSW 02/20/2015

## 2015-02-21 ENCOUNTER — Encounter (HOSPITAL_COMMUNITY): Payer: Self-pay | Admitting: Clinical

## 2015-03-07 ENCOUNTER — Encounter (HOSPITAL_COMMUNITY): Payer: Self-pay | Admitting: Clinical

## 2015-03-07 ENCOUNTER — Ambulatory Visit (INDEPENDENT_AMBULATORY_CARE_PROVIDER_SITE_OTHER): Payer: Medicaid Other | Admitting: Clinical

## 2015-03-07 DIAGNOSIS — F333 Major depressive disorder, recurrent, severe with psychotic symptoms: Secondary | ICD-10-CM | POA: Diagnosis not present

## 2015-03-07 NOTE — Progress Notes (Signed)
   THERAPIST PROGRESS NOTE  Session Time: 2:33 - 3:19  Participation Level: Active  Behavioral Response: CasualAlertDepressed  Type of Therapy: Individual Therapy  Treatment Goals addressed: improve psychiatric symptoms,reduce hallucinations and/or delusions, interpersonal relationship skills   Interventions: CBT, Motivational Interviewing, Grounding and Mindfulness Techniques  Summary: Naviyah L. Crounse  is a 57.y.o. female who presents with Major Depressive Disorder with psychotic features.  Suicidal/Homicidal: No -without intent/plan  Therapist Response:  Hassan Rowan met with clinician for an individual session. Kennidee discussed her psychiatric symptoms, her current life events, and her homework. Rania shared that she continues to have the feeling that someone is in the house at night. She shared that she is taking her Haldol every other night because she believes it increases her paranoia. Client and clinician discussed the paranoia and the events that are more likely to precede it. Client and clinician discussed her response to the sensation. Clinician encouraged her to share with her psychiatrist. Anara shared that she had a upcoming appointment and she planned to discuss it with him. Michi shared that she had a nice Christmas. She stated that her son was very happy that they were able to have anything at all and had treated her very well. Client and clinician discussed her relationship with her son and the efforts she makes to make sure that she is providing him most with schedule and interaction so that he can be most successful. Kyia loves her son and he also has special challenges some control issues. Shantika  stated that he teaches him some of the things we practice in therapy so that they can help each other.  She shared that he challenged her to sleep with the lights off which she has for two nights. Client and clinician discussed the benefit of supporting each other to grow and  challenge each other in positive ways. Client and clinician discussed the grounding technique grounding and mindfulness techniques that she has been practicing. She shared that she is using a guided meditation, breathing techniques, and the 10 x 3  category technique. Client and clinician discussed how she could use the guided meditation to help her when she wakes up in the middle the night with paranoia. Ratasha agreed to give it a try. 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 Client and clinician discussed her efforts and her results. Aundra agreed to continue practicing her grounding and mindfulness techniques until next session as well as to speak to her psychiatrist a bout her medication.  Plan: Return again in 1 weeks.  Diagnosis: Axis I: Severe recurrent major depressive disorder with psychotic features    Tarig Zimmers A, LCSW 03/07/2015

## 2015-03-21 ENCOUNTER — Other Ambulatory Visit: Payer: Self-pay | Admitting: Urology

## 2015-03-21 ENCOUNTER — Ambulatory Visit (INDEPENDENT_AMBULATORY_CARE_PROVIDER_SITE_OTHER): Payer: Medicaid Other | Admitting: Clinical

## 2015-03-21 ENCOUNTER — Encounter (HOSPITAL_COMMUNITY): Payer: Self-pay | Admitting: Clinical

## 2015-03-21 DIAGNOSIS — F333 Major depressive disorder, recurrent, severe with psychotic symptoms: Secondary | ICD-10-CM | POA: Diagnosis not present

## 2015-03-21 NOTE — Progress Notes (Signed)
   THERAPIST PROGRESS NOTE  Session Time: 2:35 - 3:15  Participation Level: Active  Behavioral Response: CasualAlertDepressed  Type of Therapy: Individual Therapy  Treatment Goals addressed: improve psychiatric symptoms, improve unhelpful thinking patterns,   Interventions: CBT, Motivational Interviewing, Grounding and Mindfulness Techniques  Summary: Alixandra L. Schiefelbein  is a 62.y.o. female who presents with Major Depressive Disorder with psychotic features.  Suicidal/Homicidal: No -without intent/plan  Therapist Response:  Hassan Rowan met with clinician for an individual session. Sachi discussed her psychiatric symptoms and her current life events. Korinna shared that she did not sleep last night and her belief that people were in her house was strong. She shared that she just found out yesterday that she has bladder cancer. She stated that the doctor told her it was in the early stages and they would likely be able to remove it with minor surgery. She shared that while all this seems reassuring she is still experiencing a lot of stress. She shared that she has not told her son or any of her supports. She shared that she had not yet told her sponsor because she wanted to sit with the news and process it. Clinician encouraged her to talk to her sponsor within the next few days so that she can receive support and assistance with making her appointment. Kiara shared some of her catastrophic thoughts with clinician. She shared that she was concerned about it being more than the doctor expects it to be, worried about who will take care of her son, and concerns about work. Client and clinician examined each of her concerns and Lester was able to formulate healthy solutions. Hassan Rowan and clinician discussed solutions to improve her sleep.  Plan: Return again in 1 weeks.  Diagnosis: Axis I: Severe recurrent major depressive disorder with psychotic features    Makarios Madlock A, LCSW 03/21/2015

## 2015-03-22 ENCOUNTER — Ambulatory Visit: Payer: Self-pay | Admitting: Urology

## 2015-03-22 ENCOUNTER — Ambulatory Visit (INDEPENDENT_AMBULATORY_CARE_PROVIDER_SITE_OTHER): Payer: Medicaid Other | Admitting: Psychiatry

## 2015-03-22 VITALS — BP 144/83 | HR 87 | Ht 68.0 in | Wt 231.4 lb

## 2015-03-22 DIAGNOSIS — F333 Major depressive disorder, recurrent, severe with psychotic symptoms: Secondary | ICD-10-CM

## 2015-03-22 MED ORDER — ZOLPIDEM TARTRATE 10 MG PO TABS: 10.0000 mg | ORAL_TABLET | Freq: Every evening | ORAL | Status: DC | PRN

## 2015-03-22 MED ORDER — HYDROXYZINE PAMOATE 25 MG PO CAPS: ORAL_CAPSULE | ORAL | Status: DC

## 2015-03-22 MED ORDER — ARIPIPRAZOLE 5 MG PO TABS: 5.0000 mg | ORAL_TABLET | Freq: Every day | ORAL | Status: DC

## 2015-03-22 MED ORDER — HALOPERIDOL 2 MG PO TABS: 2.0000 mg | ORAL_TABLET | Freq: Every day | ORAL | Status: DC

## 2015-03-22 NOTE — Progress Notes (Signed)
[facility] MD Progress Note  03/22/2015 4:31 PM Raven [name]  MRN:  [id number] Subjective:  Doing well Principal Problem: Major depression with psychotic features Diagnosis: Major depression with psychotic features At this time the patient is doing fairly well. She is working part-time job. Her son is doing okay and is going back to his general school. That is [employer] school. The patient says that she is still not sleeping well. She has interrupted. She's eating well has good energy. Fortunately this time her voices are gone. She is no visions no auditory hallucinations she's not paranoid. She gets frightened when she wakes up in the middle the night. The patient watches TV he loves music and plays games on her phone. Generally she is thinking clearly and organized. At some point over the last few months she was placed on Haldol 2 mg for an emergency visit here. She apparently was quite agitated and Haldol has helped. The patient continues in one-to-one therapy.  Patient Active Problem List   Diagnosis Date Noted  . Severe recurrent major depressive disorder with psychotic features ([place]) [F33.3] 10/13/2014  . Diabetes mellitus, type 2 ([place]) [E11.9] 03/03/2014  . Hypothyroidism [E03.9] 03/03/2014   Total Time spent with patient: 30 minutes  Past Psychiatric History:   Past Medical History:  Past Medical History  Diagnosis Date  . Depression   . Diabetes mellitus without complication ([place])   . Anxiety   . Arthritis   . Hyperlipidemia   . Thyroid disease   . Pain     a lot pain in elbows,knees,shoulders,thumbs  . Glaucoma     Past Surgical History  Procedure Laterality Date  . Thyroidectomy    . Eye surgery      Bil   Family History:  Family History  Problem Relation Age of Onset  . Diabetes Mother   . Stroke Mother   . Heart disease Mother   . Cancer Sister   . Alcohol abuse Father   . Colon cancer Father   . Alcohol abuse Sister   . Alcohol abuse Brother   . Drug abuse  Brother   . Alcohol abuse Brother   . Drug abuse Brother    Family Psychiatric  History:  Social History:  History  Alcohol Use No     History  Drug Use No    Social History   Social History  . Marital Status: Single    Spouse Name: N/A  . Number of Children: N/A  . Years of Education: N/A   Social History Main Topics  . Smoking status: Current Every Day Smoker -- 0.25 packs/day for 3 years    Types: Cigarettes  . Smokeless tobacco: Never Used  . Alcohol Use: No  . Drug Use: No  . Sexual Activity: No   Other Topics Concern  . Not on file   Social History Narrative   Additional Social History:                         Sleep: Fair  Appetite:  Fair  Current Medications: Current Outpatient Prescriptions  Medication Sig Dispense Refill  . ALPHAGAN P 0.1 % SOLN Place 1 drop into both eyes 3 (three) times daily.  0  . ARIPiprazole (ABILIFY) 5 MG tablet Take 1 tablet (5 mg total) by mouth daily. 30 tablet 4  . aspirin 81 MG chewable tablet Chew 81 mg by mouth every morning.    . bimatoprost (LATISSE) 0.03 % ophthalmic solution  Place into both eyes at bedtime. Place one drop on applicator and apply evenly along the skin of the upper eyelid at base of eyelashes once daily at bedtime; repeat procedure for second eye (use a clean applicator).    Marland Kitchen buPROPion (WELLBUTRIN XL) 150 MG 24 hr tablet 3 qam 90 tablet 4  . glucose blood test strip Check BS three times per day for E11.9 100 each 12  . glucose monitoring kit (FREESTYLE) monitoring kit 1 each by Does not apply route as needed. Check BS three times per day for E11.9 1 each 0  . haloperidol (HALDOL) 2 MG tablet Take 1 tablet (2 mg total) by mouth at bedtime. 30 tablet 5  . HYDROcodone-acetaminophen (NORCO/VICODIN) 5-325 MG per tablet Take 1-2 tablets by mouth every 6 hours as needed for pain and/or cough. (Patient not taking: Reported on 07/06/2014) 11 tablet 0  . hydrOXYzine (VISTARIL) 25 MG capsule 1   Tid   2  prn  150 capsule 3  . ibuprofen (ADVIL,MOTRIN) 600 MG tablet Take 1 tablet (600 mg total) by mouth every 6 (six) hours as needed. (Patient not taking: Reported on 10/25/2014) 30 tablet 0  . INVOKAMET [phone] MG TABS TK 1 T PO  BID  0  . Lancets (FREESTYLE) lancets Check BS three times per day for E11.9 100 each 12  . levothyroxine (SYNTHROID, LEVOTHROID) 175 MCG tablet Take 1 tablet by mouth every evening.  0  . levothyroxine (SYNTHROID, LEVOTHROID) 200 MCG tablet Take 1 tablet (200 mcg total) by mouth daily before breakfast. (Patient not taking: Reported on 03/07/2015) 30 tablet 3  . omeprazole (PRILOSEC) 20 MG capsule Take 1 capsule (20 mg total) by mouth daily. 30 capsule 3  . simvastatin (ZOCOR) 40 MG tablet Take 1 tablet (40 mg total) by mouth daily. 30 tablet 3  . traZODone (DESYREL) 100 MG tablet 1  qhs  If  Not sleeping increase to 2 qhs (Patient not taking: Reported on 03/07/2015) 60 tablet 3  . zolpidem (AMBIEN) 10 MG tablet Take 1 tablet (10 mg total) by mouth at bedtime as needed for sleep. 30 tablet 3   No current facility-administered medications for this visit.    Lab Results: No results found for this or any previous visit (from the past 48 hour(s)).  Physical Findings: AIMS:  , ,  ,  ,    CIWA:    COWS:     Musculoskeletal: Strength & Muscle Tone: within normal limits Gait & Station: normal Patient leans: N/A  Psychiatric Specialty Exam: ROS  Blood pressure 144/83, pulse 87, height _0  (1.727 m), weight 231 lb 6.4 oz (104.962 kg).Body mass index is 35.19 kg/(m^2).  General Appearance: Casual  Eye Contact::  Good  Speech:  Clear and Coherent  Volume:  Normal  Mood:  NA  Affect:  Congruent  Thought Process:  Coherent  Orientation:  Full (Time, Place, and Person)  Thought Content:  WDL  Suicidal Thoughts:  No  Homicidal Thoughts:  No  Memory:  NA  Judgement:  Good  Insight:  Good  Psychomotor Activity:  Normal  Concentration:  Good  Recall:  Good  Fund of  Knowledge:Good  Language: Good  Akathisia:  No  Handed:  Right  AIMS (if indicated):     Assets:  Desire for Improvement  ADL's:  Intact  Cognition: WNL  Sleep:      Treatment Plan Summary: At this time the patient will continue taking Haldol 2 mg continue her Wellbutrin  as ordered. Today we will add Vistaril 25 mg 3 times a day with 2 extra if she needs it at night. The patient also begin on Ambien 10 mg. The patient initially had requested a benzodiazepines but given the fact that she didn't recover for 13 years and she is to hold off on giving her Xanax or benzodiazepine mother natures. Overall her psychosis is relatively well controlled taking 5 mg of Abilify and 2 mg of Haldol. Her mood is relatively well controlled taking Wellbutrin as ordered. She is engaged in therapy which is good. This patient to return to see me in 2 months.  Daina Cara, [place] 03/22/2015, 4:31 PM

## 2015-03-27 ENCOUNTER — Other Ambulatory Visit: Payer: Self-pay | Admitting: Urology

## 2015-04-04 ENCOUNTER — Ambulatory Visit (HOSPITAL_COMMUNITY): Payer: Self-pay | Admitting: Clinical

## 2015-04-11 ENCOUNTER — Ambulatory Visit: Payer: Self-pay | Admitting: Urology

## 2015-04-11 MED ORDER — SODIUM CHLORIDE 0.9 % IV SOLN: 50.0000 mg | Freq: Once | INTRAVENOUS | Status: DC

## 2015-04-17 DIAGNOSIS — M19019 Primary osteoarthritis, unspecified shoulder: Secondary | ICD-10-CM | POA: Insufficient documentation

## 2015-04-24 ENCOUNTER — Encounter (HOSPITAL_BASED_OUTPATIENT_CLINIC_OR_DEPARTMENT_OTHER): Payer: Self-pay | Admitting: *Deleted

## 2015-04-24 NOTE — Progress Notes (Signed)
NPO AFTER MN.  ARRIVE AT 1000.  NEEDS ISTAT AND EKG.  WILL TAKE AM MEDS W/ SIPS OF WATER WITH EXCEPTION NO INVOKAMET.

## 2015-04-27 ENCOUNTER — Ambulatory Visit (INDEPENDENT_AMBULATORY_CARE_PROVIDER_SITE_OTHER): Payer: Medicaid Other | Admitting: Clinical

## 2015-04-27 ENCOUNTER — Encounter (HOSPITAL_COMMUNITY): Payer: Self-pay | Admitting: Clinical

## 2015-04-27 DIAGNOSIS — F333 Major depressive disorder, recurrent, severe with psychotic symptoms: Secondary | ICD-10-CM | POA: Diagnosis not present

## 2015-04-27 NOTE — Progress Notes (Signed)
   THERAPIST PROGRESS NOTE  Session Time: 12:00 -12:54  Participation Level: Active  Behavioral Response: CasualAlertAnxious  Type of Therapy: Individual Therapy  Treatment Goals addressed: improve psychiatric symptoms, improve unhelpful thinking patterns, interpersonal relationship skills   Interventions: CBT, Motivational Interviewing, Grounding and Mindfulness Techniques  Summary: Raven [name]  is a 48.y.o. female who presents with Major Depressive Disorder with psychotic features.  Suicidal/Homicidal: No -without intent/plan  Therapist Response:  Hassan Rowan met with clinician for an individual session. Raven [name] discussed her psychiatric symptoms and her current life events. Raven [name] shared that she was having a lot of anxiety and fear about an operation she is having  tomorrow. Client and clinician discussed her fears and anxieties. Raven [name] was able to identify some fears she was able to identify some of the fears as false or unfounded. Client and clinician discussed ways she could continue to challenge her negative thoughts and fears. Raven [name] shared about the things she has in place to help her recover. Raven [name] shared that her older son will take her to the doctor and Monday her sponsor will come and help her and visit with her. Hassan Rowan and clinician discussed how she could use her grounding and mindfulness techniques to keep her stress down and to interrupt negative thoughts and emotions. Client and clinician practiced a technique together.    Plan: Return again in 1 weeks.  Diagnosis: Axis I: Severe recurrent major depressive disorder with psychotic features   Seiji Wiswell A, LCSW 04/27/2015

## 2015-04-28 ENCOUNTER — Ambulatory Visit (HOSPITAL_BASED_OUTPATIENT_CLINIC_OR_DEPARTMENT_OTHER): Payer: Medicaid Other | Admitting: Anesthesiology

## 2015-04-28 ENCOUNTER — Encounter (HOSPITAL_BASED_OUTPATIENT_CLINIC_OR_DEPARTMENT_OTHER)
Admission: RE | Disposition: A | Discharge status: Home / Self Care | Payer: Self-pay | Source: Ambulatory Visit | Attending: Urology

## 2015-04-28 ENCOUNTER — Encounter (HOSPITAL_BASED_OUTPATIENT_CLINIC_OR_DEPARTMENT_OTHER): Payer: Self-pay | Admitting: *Deleted

## 2015-04-28 ENCOUNTER — Ambulatory Visit (HOSPITAL_BASED_OUTPATIENT_CLINIC_OR_DEPARTMENT_OTHER)
Admission: RE | Admit: 2015-04-28 | Discharge: 2015-04-28 | Disposition: A | Discharge status: Home / Self Care | Payer: Medicaid Other | Source: Ambulatory Visit | Attending: Urology | Admitting: Urology

## 2015-04-28 DIAGNOSIS — C678 Malignant neoplasm of overlapping sites of bladder: Secondary | ICD-10-CM | POA: Insufficient documentation

## 2015-04-28 DIAGNOSIS — F339 Major depressive disorder, recurrent, unspecified: Secondary | ICD-10-CM | POA: Diagnosis not present

## 2015-04-28 DIAGNOSIS — Y838 Other surgical procedures as the cause of abnormal reaction of the patient, or of later complication, without mention of misadventure at the time of the procedure: Secondary | ICD-10-CM | POA: Diagnosis not present

## 2015-04-28 DIAGNOSIS — H409 Unspecified glaucoma: Secondary | ICD-10-CM | POA: Diagnosis not present

## 2015-04-28 DIAGNOSIS — E119 Type 2 diabetes mellitus without complications: Secondary | ICD-10-CM | POA: Insufficient documentation

## 2015-04-28 DIAGNOSIS — E785 Hyperlipidemia, unspecified: Secondary | ICD-10-CM | POA: Insufficient documentation

## 2015-04-28 DIAGNOSIS — C672 Malignant neoplasm of lateral wall of bladder: Secondary | ICD-10-CM | POA: Diagnosis present

## 2015-04-28 DIAGNOSIS — G4733 Obstructive sleep apnea (adult) (pediatric): Secondary | ICD-10-CM | POA: Insufficient documentation

## 2015-04-28 DIAGNOSIS — F17210 Nicotine dependence, cigarettes, uncomplicated: Secondary | ICD-10-CM | POA: Insufficient documentation

## 2015-04-28 DIAGNOSIS — E890 Postprocedural hypothyroidism: Secondary | ICD-10-CM | POA: Diagnosis not present

## 2015-04-28 DIAGNOSIS — C674 Malignant neoplasm of posterior wall of bladder: Secondary | ICD-10-CM

## 2015-04-28 HISTORY — DX: Major depressive disorder, recurrent, severe with psychotic symptoms: F33.3

## 2015-04-28 HISTORY — DX: Type 2 diabetes mellitus without complications: E11.9

## 2015-04-28 HISTORY — DX: Presence of dental prosthetic device (complete) (partial): Z97.2

## 2015-04-28 HISTORY — PX: TRANSURETHRAL RESECTION OF BLADDER TUMOR: SHX2575

## 2015-04-28 HISTORY — DX: Postprocedural hypothyroidism: E89.0

## 2015-04-28 HISTORY — DX: Presence of spectacles and contact lenses: Z97.3

## 2015-04-28 HISTORY — DX: Obstructive sleep apnea (adult) (pediatric): G47.33

## 2015-04-28 HISTORY — DX: Neoplasm of unspecified behavior of bladder: D49.4

## 2015-04-28 HISTORY — DX: Unspecified glaucoma: H40.9

## 2015-04-28 HISTORY — DX: Other psychoactive substance abuse, in remission: F19.11

## 2015-04-28 LAB — POCT I-STAT, CHEM 8
BUN: 16 mg/dL (ref 6–20)
CHLORIDE: 104 mmol/L (ref 101–111)
CREATININE: 0.9 mg/dL (ref 0.44–1.00)
Calcium, Ion: 1.27 mmol/L — ABNORMAL HIGH (ref 1.12–1.23)
Glucose, Bld: 106 mg/dL — ABNORMAL HIGH (ref 65–99)
HEMATOCRIT: 48 % — AB (ref 36.0–46.0)
Hemoglobin: 16.3 g/dL — ABNORMAL HIGH (ref 12.0–15.0)
Potassium: 4.1 mmol/L (ref 3.5–5.1)
SODIUM: 142 mmol/L (ref 135–145)
TCO2: 25 mmol/L (ref 0–100)

## 2015-04-28 LAB — GLUCOSE, CAPILLARY: Glucose-Capillary: 98 mg/dL (ref 65–99)

## 2015-04-28 SURGERY — TURBT (TRANSURETHRAL RESECTION OF BLADDER TUMOR)
Anesthesia: General | Site: Bladder

## 2015-04-28 MED ORDER — ONDANSETRON HCL 4 MG/2ML IJ SOLN
INTRAMUSCULAR | Status: AC
  Filled 2015-04-28: qty 2

## 2015-04-28 MED ORDER — GLYCOPYRROLATE 0.2 MG/ML IJ SOLN
INTRAMUSCULAR | Status: DC | PRN
  Administered 2015-04-28: 0.2 mg via INTRAVENOUS

## 2015-04-28 MED ORDER — TRAMADOL HCL 50 MG PO TABS: 50.0000 mg | ORAL_TABLET | Freq: Four times a day (QID) | ORAL | Status: DC | PRN

## 2015-04-28 MED ORDER — CEFAZOLIN SODIUM 1-5 GM-% IV SOLN
1.0000 g | INTRAVENOUS | Status: AC
  Administered 2015-04-28: 2 g via INTRAVENOUS
  Filled 2015-04-28: qty 50

## 2015-04-28 MED ORDER — MIDAZOLAM HCL 2 MG/2ML IJ SOLN
INTRAMUSCULAR | Status: DC | PRN
Start: 2015-04-28 — End: 2015-04-28
  Administered 2015-04-28: 2 mg via INTRAVENOUS

## 2015-04-28 MED ORDER — DEXAMETHASONE SODIUM PHOSPHATE 10 MG/ML IJ SOLN
INTRAMUSCULAR | Status: AC
  Filled 2015-04-28: qty 1

## 2015-04-28 MED ORDER — LACTATED RINGERS IV SOLN
INTRAVENOUS | Status: DC
  Administered 2015-04-28: 11:00:00 via INTRAVENOUS
  Filled 2015-04-28: qty 1000

## 2015-04-28 MED ORDER — PROPOFOL 10 MG/ML IV BOLUS
INTRAVENOUS | Status: AC
  Filled 2015-04-28: qty 20

## 2015-04-28 MED ORDER — ACETAMINOPHEN 160 MG/5ML PO SOLN
ORAL | Status: AC
  Filled 2015-04-28: qty 40.6

## 2015-04-28 MED ORDER — ONDANSETRON HCL 4 MG/2ML IJ SOLN
INTRAMUSCULAR | Status: DC | PRN
  Administered 2015-04-28: 4 mg via INTRAVENOUS

## 2015-04-28 MED ORDER — ACETAMINOPHEN 160 MG/5ML PO SOLN
975.0000 mg | Freq: Once | ORAL | Status: AC
  Administered 2015-04-28: 975 mg via ORAL
  Filled 2015-04-28: qty 30.5

## 2015-04-28 MED ORDER — SULFAMETHOXAZOLE-TRIMETHOPRIM 800-160 MG PO TABS: 10.0000 | ORAL_TABLET | Freq: Two times a day (BID) | ORAL | Status: DC

## 2015-04-28 MED ORDER — GLYCOPYRROLATE 0.2 MG/ML IJ SOLN
INTRAMUSCULAR | Status: AC
  Filled 2015-04-28: qty 1

## 2015-04-28 MED ORDER — FENTANYL CITRATE (PF) 100 MCG/2ML IJ SOLN
25.0000 ug | INTRAMUSCULAR | Status: DC | PRN
  Filled 2015-04-28: qty 1

## 2015-04-28 MED ORDER — BELLADONNA ALKALOIDS-OPIUM 16.2-60 MG RE SUPP
RECTAL | Status: AC
  Filled 2015-04-28: qty 1

## 2015-04-28 MED ORDER — FENTANYL CITRATE (PF) 100 MCG/2ML IJ SOLN
INTRAMUSCULAR | Status: AC
  Filled 2015-04-28: qty 2

## 2015-04-28 MED ORDER — SODIUM CHLORIDE 0.9 % IR SOLN
Status: DC | PRN
  Administered 2015-04-28: 3000 mL

## 2015-04-28 MED ORDER — CEFAZOLIN SODIUM-DEXTROSE 2-3 GM-% IV SOLR
INTRAVENOUS | Status: AC
  Filled 2015-04-28: qty 50

## 2015-04-28 MED ORDER — MIDAZOLAM HCL 2 MG/2ML IJ SOLN
INTRAMUSCULAR | Status: AC
  Filled 2015-04-28: qty 2

## 2015-04-28 MED ORDER — FENTANYL CITRATE (PF) 100 MCG/2ML IJ SOLN
INTRAMUSCULAR | Status: DC | PRN
  Administered 2015-04-28: 100 ug via INTRAVENOUS

## 2015-04-28 SURGICAL SUPPLY — 17 items
BAG DRAIN URO-CYSTO SKYTR STRL (DRAIN) ×3 IMPLANT
BAG URINE LEG 500ML (DRAIN) ×3 IMPLANT
CATH FOLEY 2WAY SLVR  5CC 20FR (CATHETERS) ×2
CATH FOLEY 2WAY SLVR 5CC 20FR (CATHETERS) ×1 IMPLANT
CLOTH BEACON ORANGE TIMEOUT ST (SAFETY) ×3 IMPLANT
EVACUATOR MICROVAS BLADDER (UROLOGICAL SUPPLIES) ×3 IMPLANT
GLOVE BIO SURGEON STRL SZ8 (GLOVE) ×3 IMPLANT
GOWN STRL REUS W/ TWL XL LVL3 (GOWN DISPOSABLE) ×2 IMPLANT
GOWN STRL REUS W/TWL XL LVL3 (GOWN DISPOSABLE) ×4
KIT ROOM TURNOVER WOR (KITS) ×3 IMPLANT
LOOP CUT BIPOLAR 24F LRG (ELECTROSURGICAL) ×3 IMPLANT
MANIFOLD NEPTUNE II (INSTRUMENTS) ×3 IMPLANT
PACK CYSTO (CUSTOM PROCEDURE TRAY) ×3 IMPLANT
SET ASPIRATION TUBING (TUBING) IMPLANT
SYRINGE IRR TOOMEY STRL 70CC (SYRINGE) ×3 IMPLANT
TUBE CONNECTING 12'X1/4 (SUCTIONS)
TUBE CONNECTING 12X1/4 (SUCTIONS) IMPLANT

## 2015-04-28 NOTE — Op Note (Signed)
PATIENT:  Raven [name]  PRE-OPERATIVE DIAGNOSIS: 2 cm bladder tumor  POST-OPERATIVE DIAGNOSIS: Same  PROCEDURE: TURBT 2 cm bladder tumor  SURGEON:  Lillette Boxer. Avion Patella, M.D.  ANESTHESIA:  General  EBL:  Minimal  DRAINS: 70 French Foley catheter, the leg bag  LOCAL MEDICATIONS USED:  None  SPECIMEN:  Bladder tumor fragments, to pathology  INDICATION: Raven [name] is a 55 year old female recently diagnosed with a bladder tumor, 2 cm, on the right posterior/lateral bladder wall. She presents at this time for cystoscopy, TURBT and possible epirubicin placement. Risks and complications of the procedure have been discussed with the patient. These include, but are not limited to infection, bleeding, the need for catheter afterwards, possible epirubicin placement versus non-installation of there is a perforation. She understands these and desires to proceed.  Description of procedure: The patient was properly identified and marked (if applicable) in the holding area. They were then  taken to the operating room and placed on the table in a supine position. General anesthesia was then administered. Once fully anesthetized the patient was moved to the dorsolithotomy position and the genitalia and perineum were sterilely prepped and draped in standard fashion. An official timeout was then performed.  A 26 French resectoscope sheath was placed with the visual obturator. The bladder was inspected circumferentially with the 30 lens. The bladder was normal except for the 2 cm bladder tumor which now. Just to the right of the midline above the trigone. This was papillary in nature although it was not pedunculated.  Following circumferential inspection of the bladder, the cutting loop and resectoscope were then placed. The tumor was resected first with the cutting loop. The patient had irregular deep breathing, and unfortunately, there was a perforation noted during the resection. This precluded use of  epirubicin, unfortunately. Because of the irregular breathing, I then switched to using the cold cup biopsy forceps to remove the majority of the tumor, which was done white easily. Following removal of several biopsy fragments, there was no further abnormal urothelium. The fragments were sent for permanent pathology labeled "bladder tumor". The base of the bladder tumors not sent, as there was a perforation. The resected site was carefully coagulated with the coag current, until adequate hemostasis was noted.  At this point, the scope was removed. A 20 French Foley catheter was placed, the balloon filled with 10 mL of water, and this was hooked to dependent drainage.  The patient tolerated procedure well. She is awakened and taken to the PACU in stable condition.    PLAN OF CARE: Discharge to home after PACU  PATIENT DISPOSITION:  PACU - hemodynamically stable.

## 2015-04-28 NOTE — Anesthesia Preprocedure Evaluation (Signed)
Anesthesia Evaluation  Patient identified by MRN, date of birth, ID band Patient awake    Reviewed: Allergy & Precautions, H&P , Patient's Chart, lab work & pertinent test results, reviewed documented beta blocker date and time   Airway Mallampati: II  TM Distance: >3 FB Neck ROM: full    Dental no notable dental hx.    Pulmonary sleep apnea , Current Smoker,    Pulmonary exam normal breath sounds clear to auscultation       Cardiovascular  Rhythm:regular Rate:Normal     Neuro/Psych    GI/Hepatic   Endo/Other  diabetes, Type 2  Renal/GU      Musculoskeletal   Abdominal   Peds  Hematology   Anesthesia Other Findings   Reproductive/Obstetrics                             Anesthesia Physical Anesthesia Plan  ASA: III  Anesthesia Plan:    Post-op Pain Management:    Induction: Intravenous  Airway Management Planned: LMA  Additional Equipment:   Intra-op Plan:   Post-operative Plan:   Informed Consent: I have reviewed the patients History and Physical, chart, labs and discussed the procedure including the risks, benefits and alternatives for the proposed anesthesia with the patient or authorized representative who has indicated his/her understanding and acceptance.   Dental Advisory Given and Dental advisory given  Plan Discussed with: CRNA and Surgeon  Anesthesia Plan Comments: (Discussed GA with LMA, possible sore throat, potential need to switch to ETT, N/V, pulmonary aspiration. Questions answered. )        Anesthesia Quick Evaluation

## 2015-04-28 NOTE — Discharge Instructions (Signed)
Transurethral Resection of Bladder Tumor (TURBT)  Definition:  Transurethral Resection of the Bladder Tumor is a surgical procedure used to diagnose and remove tumors within the bladder. TURBT is the most common treatment for early stage bladder cancer.   General instructions:  Your recent bladder surgery requires very little post hospital care but some definite precautions.  Despite the fact that no skin incisions were used, the area around the tumor removal site is raw and covered with scabs to promote healing and prevent bleeding. Certain precautions are needed to insure that the scabs are not disturbed over the next 2-4 weeks while the healing proceeds.   Remove catheter as instructed on Wednesday morning, March 1  Because the raw surface inside your bladder and the irritating effects of urine you may expect frequency of urination and/or urgency (a stronger desire to urinate) and perhaps even getting up at night more often. This will usually resolve or improve slowly over the healing period. You may see some blood in your urine over the first 6 weeks. Do not be alarmed, even if the urine was clear for a while. Get off your feet and drink lots of fluids until clearing occurs. If you start to pass clots or don't improve call us.   Diet:  You may return to your normal diet immediately. Because of the raw surface of your bladder, alcohol, spicy foods, foods high in acid and drinks with caffeine may cause irritation or frequency and should be used in moderation. To keep your urine flowing freely and avoid constipation, drink plenty of fluids during the day (8-10 glasses). Tip: Avoid cranberry juice because it is very acidic.   Activity:  Your physical activity needs to be restricted somewhat.  We suggest that you reduce your activity under the circumstances until the bleeding has stopped. Heavy lifting (greater than 20 lbs.) and heavy exertion should be limited for 2-3 weeks.  Bowels:  It is  important to keep your bowels regular during the postoperative period. Straining with bowel movements can cause bleeding. A bowel movement every other day is reasonable. Use a mild laxative if needed, such as milk of magnesia 2-3 tablespoons, or 2 Dulcolax tablets. Call if you continue to have problems. If you had been taking narcotics for pain, before, during or after your surgery, you may be constipated.   Medication:  You should resume your pre-surgery medications unless told not to. In addition you may be given an antibiotic to prevent or treat infection. Antibiotics are not always necessary. All medication should be taken as prescribed until the bottles are finished unless you are having an unusual reaction to one of the drugs.  General Anesthetic, Adult  A doctor specialized in giving anesthesia (anesthesiologist) or a nurse specialized in giving anesthesia (nurse anesthetist) gives medicine that makes you sleep while a procedure is performed (general anesthetic). Once the general anesthetic has been administered, you will be in a sleeplike state in which you feel no pain. After having a general anesthetic you may feel:  Dizzy.  Weak.  Drowsy.  Confused.  These feelings are normal and can be expected to last for up to 24 hours after the procedure.   LET YOUR CAREGIVER KNOW ABOUT:  Allergies you have.  Medications you are taking, including herbs, eye drops, over the counter medications, dietary supplements, and creams.  Previous problems you have had with anesthetics or numbing medicines.  Use of cigarettes, alcohol, or illicit drugs.  Possibility of pregnancy, if this applies.  History of bleeding or blood disorders, including blood clots and clotting disorders.  Previous surgeries you have had and types of anesthetics you have received.  Family medical history, especially anesthetic problems.  Other health problems.   AFTER THE PROCEDURE  After surgery, you will be taken to the  recovery area where a nurse will monitor your progress. You will be allowed to go home when you are awake, stable, taking fluids well, and without serious pain or complications.  For the first 24 hours following an anesthetic:  Have a responsible person with you.  Do not drive a car. If you are alone, do not take public transportation.  Do not engage in strenuous activity. You may usually resume normal activities the next day, or as advised by your caregiver.  Do not drink alcohol.  Do not take medicine that has not been prescribed by your caregiver.  Do not sign important papers or make important decisions as your judgement may be impaired.  You may resume a normal diet as directed.  Change bandages (dressings) as directed.  Only take over-the-counter or prescription medicines for pain, discomfort, or fever as directed by your caregiver.  If you have questions or problems that seem related to the anesthetic, call the hospital and ask for the anesthetist, anesthesiologist, or anesthesia department.   SEEK IMMEDIATE MEDICAL CARE IF:  You develop a rash.  You have difficulty breathing.  You have chest pain.  You have allergic problems.  You have uncontrolled nausea.  You have uncontrolled vomiting.  You develop any serious bleeding, especially from the incision site.  Document Released: 05/28/2007 Document Revised: 10/31/2010 Document Reviewed: 06/21/2010  [facility] Patient Information 2012 [place].    Foley Catheter Care, Adult A Foley catheter is a soft, flexible tube that is placed into the bladder to drain urine. A Foley catheter may be inserted if:  You leak urine or are not able to control when you urinate (urinary incontinence).  You are not able to urinate when you need to (urinary retention).  You had prostate surgery or surgery on the genitals.  You have certain medical conditions, such as multiple sclerosis, dementia, or a spinal cord injury. If you are going home  with a Foley catheter in place, follow the instructions below. TAKING CARE OF THE CATHETER 1. Wash your hands with soap and water. 2. Using mild soap and warm water on a clean washcloth:  Clean the area on your body closest to the catheter insertion site using a circular motion, moving away from the catheter. Never wipe toward the catheter because this could sweep bacteria up into the urethra and cause infection.  Remove all traces of soap. Pat the area dry with a clean towel. For males, reposition the foreskin. 3. Attach the catheter to your leg so there is no tension on the catheter. Use adhesive tape or a leg strap. If you are using adhesive tape, remove any sticky residue left behind by the previous tape you used. 4. Keep the drainage bag below the level of the bladder, but keep it off the floor. 5. Check throughout the day to be sure the catheter is working and urine is draining freely. Make sure the tubing does not become kinked. 6. Do not pull on the catheter or try to remove it. Pulling could damage internal tissues. TAKING CARE OF THE DRAINAGE BAGS You will be given two drainage bags to take home. One is a large overnight drainage bag, and the other is a  smaller leg bag that fits underneath clothing. You may wear the overnight bag at any time, but you should never wear the smaller leg bag at night. Follow the instructions below for how to empty, change, and clean your drainage bags. Emptying the Drainage Bag You must empty your drainage bag when it is  - full or at least 2-3 times a day. 1. Wash your hands with soap and water. 2. Keep the drainage bag below your hips, below the level of your bladder. This stops urine from going back into the tubing and into your bladder. 3. Hold the dirty bag over the toilet or a clean container. 4. Open the pour spout at the bottom of the bag and empty the urine into the toilet or container. Do not let the pour spout touch the toilet, container, or any  other surface. Doing so can place bacteria on the bag, which can cause an infection. 5. Clean the pour spout with a gauze pad or cotton ball that has rubbing alcohol on it. 6. Close the pour spout. 7. Attach the bag to your leg with adhesive tape or a leg strap. 8. Wash your hands well. Changing the Drainage Bag Change your drainage bag once a month or sooner if it starts to smell bad or look dirty. Below are steps to follow when changing the drainage bag. 1. Wash your hands with soap and water. 2. Pinch off the rubber catheter so that urine does not spill out. 3. Disconnect the catheter tube from the drainage tube at the connection valve. Do not let the tubes touch any surface. 4. Clean the end of the catheter tube with an alcohol wipe. Use a different alcohol wipe to clean the end of the drainage tube. 5. Connect the catheter tube to the drainage tube of the clean drainage bag. 6. Attach the new bag to the leg with adhesive tape or a leg strap. Avoid attaching the new bag too tightly. 7. Wash your hands well. Cleaning the Drainage Bag 1. Wash your hands with soap and water. 2. Wash the bag in warm, soapy water. 3. Rinse the bag thoroughly with warm water. 4. Fill the bag with a solution of white vinegar and water (1 cup vinegar to 1 qt warm water [.2 L vinegar to 1 L warm water]). Close the bag and soak it for 30 minutes in the solution. 5. Rinse the bag with warm water. 6. Hang the bag to dry with the pour spout open and hanging downward. 7. Store the clean bag (once it is dry) in a clean plastic bag. 8. Wash your hands well. PREVENTING INFECTION  Wash your hands before and after handling your catheter.  Take showers daily and wash the area where the catheter enters your body. Do not take baths. Replace wet leg straps with dry ones, if this applies.  Do not use powders, sprays, or lotions on the genital area. Only use creams, lotions, or ointments as directed by your caregiver.  For  females, wipe from front to back after each bowel movement.  Drink enough fluids to keep your urine clear or pale yellow unless you have a fluid restriction.  Do not let the drainage bag or tubing touch or lie on the floor.  Wear cotton underwear to absorb moisture and to keep your skin drier. SEEK MEDICAL CARE IF:   Your urine is cloudy or smells unusually bad.  Your catheter becomes clogged.  You are not draining urine into the bag or  your bladder feels full.  Your catheter starts to leak. SEEK IMMEDIATE MEDICAL CARE IF:   You have pain, swelling, redness, or pus where the catheter enters the body.  You have pain in the abdomen, legs, lower back, or bladder.  You have a fever.  You see blood fill the catheter, or your urine is pink or red.  You have nausea, vomiting, or chills.  Your catheter gets pulled out. MAKE SURE YOU:   Understand these instructions.  Will watch your condition.  Will get help right away if you are not doing well or get worse.   This information is not intended to replace advice given to you by your health care provider. Make sure you discuss any questions you have with your health care provider.   Document Released: 02/18/2005 Document Revised: 07/05/2013 Document Reviewed: 02/10/2012 Elsevier Interactive Patient Education 2016 [place] [facility] Care Instructions  Activity: Get plenty of rest for the remainder of the day. A responsible adult should stay with you for 24 hours following the procedure.  For the next 24 hours, DO NOT: -Drive a car -Paediatric nurse -Drink alcoholic beverages -Take any medication unless instructed by your physician -Make any legal decisions or sign important papers.  Meals: Start with liquid foods such as gelatin or soup. Progress to regular foods as tolerated. Avoid greasy, spicy, heavy foods. If nausea and/or vomiting occur, drink only clear liquids until the nausea and/or vomiting  subsides. Call your physician if vomiting continues.  Special Instructions/Symptoms: Your throat may feel dry or sore from the anesthesia or the breathing tube placed in your throat during surgery. If this causes discomfort, gargle with warm salt water. The discomfort should disappear within 24 hours.  If you had a scopolamine patch placed behind your ear for the management of post- operative nausea and/or vomiting:  1. The medication in the patch is effective for 72 hours, after which it should be removed.  Wrap patch in a tissue and discard in the trash. Wash hands thoroughly with soap and water. 2. You may remove the patch earlier than 72 hours if you experience unpleasant side effects which may include dry mouth, dizziness or visual disturbances. 3. Avoid touching the patch. Wash your hands with soap and water after contact with the patch.

## 2015-04-28 NOTE — Transfer of Care (Signed)
Immediate Anesthesia Transfer of Care Note  Patient: Raven [name]  Procedure(s) Performed: Procedure(s): TRANSURETHRAL RESECTION OF BLADDER TUMOR (TURBT)  (N/A)  Patient Location: PACU  Anesthesia Type:General  Level of Consciousness: awake, alert  and oriented  Airway & Oxygen Therapy: Patient Spontanous Breathing and Patient connected to nasal cannula oxygen  Post-op Assessment: Report given to RN and Post -op Vital signs reviewed and stable  Post vital signs: Reviewed and stable  Last Vitals:  Filed Vitals:   04/28/15 0958  BP: 134/76  Pulse: 91  Temp: 36.9 C  Resp: 12    Complications: No apparent anesthesia complications

## 2015-04-28 NOTE — H&P (Signed)
  H&P  Chief Complaint: Bladder cancer  History of Present Illness: Raven [name] is a 55 y.o. year old female who recently presented for evaluation of gross hematuria. 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 She presents for TURBT as well as placement of epirubicin intravesically after the procedure.  Past Medical History  Diagnosis Date  . Anxiety   . Hyperlipidemia   . Arthritis     all joints and thumbs  . Type 2 diabetes mellitus ([place])   . Severe recurrent major depression with psychotic features [facility])     followed by psychologist--  dr Norma Fredrickson  . Bladder tumor   . Post-surgical hypothyroidism   . 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 Wears glasses   . Glaucoma of both eyes   . Wears dentures     upper  . OSA (obstructive sleep apnea)     per pt study 2008  no cpap    Past Surgical History  Procedure Laterality Date  . Thyroidectomy  2004  . Strabismus surgery Bilateral age 49  . Colonoscopy  Nov 2016    Home Medications:  No prescriptions prior to admission    Allergies: No Known Allergies  Family History  Problem Relation Age of Onset  . Diabetes Mother   . Stroke Mother   . Heart disease Mother   . Cancer Sister   . Alcohol abuse Father   . Colon cancer Father   . Alcohol abuse Sister   . Alcohol abuse Brother   . Drug abuse Brother   . Alcohol abuse Brother   . Drug abuse Brother     Social History:  reports that she has been smoking Cigarettes.  She has a 3.25 pack-year smoking history. She has never used smokeless tobacco. She reports that she does not drink alcohol or use illicit drugs.  ROS: A complete review of systems was performed.  All systems are negative except for pertinent findings as noted.  Physical Exam:  Vital signs in last 24 hours:   General:  Alert and oriented, No acute distress HEENT: Normocephalic, atraumatic Neck: No JVD or  lymphadenopathy Cardiovascular: Regular rate and rhythm Lungs: Clear bilaterally Abdomen: Soft, nontender, nondistended, no abdominal masses Back: No CVA tenderness Extremities: No edema Neurologic: Grossly intact  Laboratory Data:  No results found for this or any previous visit (from the past 24 hour(s)). No results found for this or any previous visit (from the past 240 hour(s)). Creatinine: No results for input(s): CREATININE in the last 168 hours.  Radiologic Imaging: No results found.  Impression/Assessment:  2 cm papillary bladder tumor  Plan: TURBT, prob epirubicin instillation post resection  Jorja Loa 04/28/2015, 6:39 AM  Lillette Boxer. Minami Arriaga MD

## 2015-04-28 NOTE — Anesthesia Procedure Notes (Signed)
Procedure Name: LMA Insertion Date/Time: 04/28/2015 12:04 PM Performed by: Rayvon Char Pre-anesthesia Checklist: Patient identified, Emergency Drugs available, Suction available and Patient being monitored Patient Re-evaluated:Patient Re-evaluated prior to inductionOxygen Delivery Method: Circle system utilized Preoxygenation: Pre-oxygenation with 100% oxygen Intubation Type: IV induction Ventilation: Mask ventilation without difficulty and Oral airway inserted - appropriate to patient size LMA: LMA inserted LMA Size: 4.0 Number of attempts: 2 Dental Injury: Teeth and Oropharynx as per pre-operative assessment

## 2015-05-01 ENCOUNTER — Encounter (HOSPITAL_BASED_OUTPATIENT_CLINIC_OR_DEPARTMENT_OTHER): Payer: Self-pay | Admitting: Urology

## 2015-05-04 ENCOUNTER — Encounter (HOSPITAL_COMMUNITY): Payer: Medicaid Other | Admitting: Clinical

## 2015-05-04 DIAGNOSIS — C689 Malignant neoplasm of urinary organ, unspecified: Secondary | ICD-10-CM | POA: Insufficient documentation

## 2015-05-04 NOTE — Progress Notes (Signed)
Patient ID: Raven [name], female   DOB: 11-25-1960, 55 y.o.   MRN: [id number]   Erroneous encounter

## 2015-05-09 NOTE — Anesthesia Postprocedure Evaluation (Signed)
Anesthesia Post Note  Patient: Raven [name]  Procedure(s) Performed: Procedure(s) (LRB): TRANSURETHRAL RESECTION OF BLADDER TUMOR (TURBT)  (N/A)  Patient location during evaluation: PACU Anesthesia Type: General Level of consciousness: sedated Pain management: satisfactory to patient Vital Signs Assessment: post-procedure vital signs reviewed and stable Respiratory status: spontaneous breathing Cardiovascular status: stable Anesthetic complications: no    Last Vitals:  Filed Vitals:   04/28/15 1300 04/28/15 1430  BP: 106/91 130/70  Pulse: 88 77  Temp:  36.9 C  Resp: 18 18    Last Pain: There were no vitals filed for this visit.               Riccardo Dubin

## 2015-05-18 ENCOUNTER — Ambulatory Visit (HOSPITAL_COMMUNITY): Payer: Self-pay | Admitting: Clinical

## 2015-06-12 ENCOUNTER — Encounter (HOSPITAL_COMMUNITY): Payer: Self-pay | Admitting: Clinical

## 2015-06-12 ENCOUNTER — Ambulatory Visit (INDEPENDENT_AMBULATORY_CARE_PROVIDER_SITE_OTHER): Payer: Medicaid Other | Admitting: Clinical

## 2015-06-12 DIAGNOSIS — F333 Major depressive disorder, recurrent, severe with psychotic symptoms: Secondary | ICD-10-CM

## 2015-06-12 NOTE — Progress Notes (Signed)
   THERAPIST PROGRESS NOTE  Session Time: 2:30 - 3:28  Participation Level: Active  Behavioral Response: CasualAlertAnxious and Depressed  Type of Therapy: Individual Therapy  Treatment Goals addressed: improve psychiatric symptoms, improve unhelpful thinking patterns, reduce hallucinations and/or delusions, interpersonal relationship skills   Interventions: \Motivational Interviewing, Grounding techniques  Summary: Raven [name]  is a 27.y.o. female who presents with Major Depressive Disorder with psychotic features.  Suicidal/Homicidal: No -without intent/plan  Therapist Response:  Raven Rowan met with clinician for an individual session. Raven [name] discussed her psychiatric symptoms, her current life events, and her homework. Raven [name] shared that she has been depressed lately. She shared about feeling overwhelmed by the multiple stressors in her life. She has a son that has behavioral problems, her mother is in a nursing home and she is very aware that she will loose her, her own health both mental and physical has been challenging. Clinician asked open ended questions about the things that she has been doing to improve her emotions and symptoms and things that she has been doing that make her symptoms worse. Raven [name] shared that she has been isolating except for talking on the phone with someone who is not good for her mental health. Raven [name] shared that she had stopped doing the things that were improving her symptoms - talking to her ex-sponsor, meditating, using her grounding techniques, getting out of the house (except to work) and doing self care. Clinician asked what it would take to start doing those things that improve her symptoms. Raven [name] shared that if she started right away then she would be more likely to continue them. Raven [name] then identified what it would take to get started. Client and clinician discussed her projections for both starting her healthy behaviors and continuing with her current  behaviors. Raven Rowan and clinician discussed ways that she could increase her social interaction. She shared that though she has been in recovery for a Raven [name] time she thought going to a 12 step meeting would help her socialize as well as remember the things she values. Raven [name] identified a meeting to attend and made a commitment to attend as well as to call her ex-sponsor today. Raven [name] reflected on the fact that Raven [name] of her stressors and challenges with mental health she recognizes that she has some power to improve her experience.Client and clinician practiced a grounding technique together  Plan: Return again in 1 weeks.  Diagnosis: Axis I: Severe recurrent major depressive disorder with psychotic features   Griffith Santilli A, LCSW 06/12/2015

## 2015-06-21 ENCOUNTER — Ambulatory Visit (INDEPENDENT_AMBULATORY_CARE_PROVIDER_SITE_OTHER): Payer: Medicaid Other | Admitting: Psychiatry

## 2015-06-21 DIAGNOSIS — F3341 Major depressive disorder, recurrent, in partial remission: Secondary | ICD-10-CM

## 2015-06-21 MED ORDER — ARIPIPRAZOLE 10 MG PO TABS: 10.0000 mg | ORAL_TABLET | Freq: Every day | ORAL | Status: DC

## 2015-06-21 MED ORDER — TEMAZEPAM 15 MG PO CAPS: ORAL_CAPSULE | ORAL | Status: DC

## 2015-06-21 MED ORDER — BUPROPION HCL ER (XL) 150 MG PO TB24: 450.0000 mg | ORAL_TABLET | Freq: Every morning | ORAL | Status: DC

## 2015-06-21 NOTE — Progress Notes (Signed)
Patient ID: Raven [name], female   DOB: [birth year], 55 y.o.   MRN: [id number] [facility] MD Progress Note  06/21/2015 4:02 PM SANTA LURA  MRN:  [id number] Subjective:  Doing well Principal Problem: Major depression with psychotic features Today the patient is actually doing fairly well. She's been working part-time but unfortunately that job will go away in a few weeks. Her son is doing better and he's come back and is going to regular public school. The patient says the voices are coming back a little bit but is only a few nights and she really she goes to sleep and is no problem. I think this patient often over interprets stimuli as hallucinations. She only hears voices a few nights a week and she sees some visions only at night. She is able to go to sleep. Nor. She wakes up well. Her last visit apparently she was given Haldol 2 mg in addition to Abilify but she's not been taking it. He doesn't remember. The patient was also given Vistaril 25 mg 3 times a day but never took it. The patient does not seem all that compliant. The patient however is in therapy every other week and she really benefits from it. At this time the patient denies daily depression. Generally she's not sleeping all that well as she has early morning awakening. Is not clear to cause a significant dysfunction. She takes no naps. The patient is eating well. She's got good energy. She is no problems thinking concentrating. She is a good sense of worth. She denies suicidal thinking. Patient was taking Ambien 10 mg which she said she thought helped her fall asleep. But does not keep her sleep. 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 At this time she doing okay. The patient continues to apply for disability. She has a disability attorney and likely will have a hearing in the next few months. Sure she is applying for emotional mental illness. The patient lives with her 21 year old son and  seems to be doing okay. She is not suicidal. The patient uses no drugs no alcohol. Total Time spent with patient: 30 minutes  Past Psychiatric History:   Past Medical History:  Past Medical History  Diagnosis Date  . Anxiety   . Hyperlipidemia   . Arthritis     all joints and thumbs  . Type 2 diabetes mellitus ([place])   . Severe recurrent major depression with psychotic features [facility])     followed by psychologist--  dr Norma Fredrickson  . Bladder tumor   . Post-surgical hypothyroidism   . 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 Wears glasses   . Glaucoma of both eyes   . Wears dentures     upper  . OSA (obstructive sleep apnea)     per pt study 2008  no cpap    Past Surgical History  Procedure Laterality Date  . Thyroidectomy  2004  . Strabismus surgery Bilateral age 69  . Colonoscopy  Nov 2016  . Transurethral resection of bladder tumor N/A 04/28/2015    Procedure: TRANSURETHRAL RESECTION OF BLADDER TUMOR (TURBT) ;  Surgeon: Franchot Gallo, MD;  Location: [facility];  Service: Urology;  Laterality: N/A;   Family History:  Family History  Problem Relation Age of Onset  . Diabetes Mother   . Stroke Mother   . Heart disease Mother   .  Cancer Sister   . Alcohol abuse Father   . Colon cancer Father   . Alcohol abuse Sister   . Alcohol abuse Brother   . Drug abuse Brother   . Alcohol abuse Brother   . Drug abuse Brother    Family Psychiatric  History:  Social History:  History  Alcohol Use No     History  Drug Use No    Comment: hx drug use --  in remission since 2003    Social History   Social History  . Marital Status: Single    Spouse Name: N/A  . Number of Children: N/A  . Years of Education: N/A   Social History Main Topics  . Smoking status: Current Every Day Smoker -- 0.25 packs/day for 13 years    Types: Cigarettes  . Smokeless tobacco: Never Used  . Alcohol Use: No  . Drug Use: No      Comment: hx drug use --  in remission since 2003  . Sexual Activity: Not on file   Other Topics Concern  . Not on file   Social History Narrative   Additional Social History:                         Sleep: Fair  Appetite:  Fair  Current Medications: Current Outpatient Prescriptions  Medication Sig Dispense Refill  . ARIPiprazole (ABILIFY) 10 MG tablet Take 1 tablet (10 mg total) by mouth daily. 1  qhs 30 tablet 4  . Bimatoprost (LUMIGAN OP) Apply to eye at bedtime.    . Brimonidine Tartrate-Timolol (COMBIGAN OP) Apply to eye 3 (three) times daily.    Marland Kitchen buPROPion (WELLBUTRIN XL) 150 MG 24 hr tablet Take 3 tablets (450 mg total) by mouth every morning. 3 qam 90 tablet 4  . haloperidol (HALDOL) 2 MG tablet Take 1 tablet (2 mg total) by mouth at bedtime. (Patient taking differently: Take 2 mg by mouth at bedtime as needed. ) 30 tablet 5  . hydrOXYzine (VISTARIL) 25 MG capsule 1   Tid   2  prn (Patient taking differently: Take 25 mg by mouth. 1   Tid   2  prn) 150 capsule 3  . INVOKAMET [phone] MG TABS TK 1 T PO  BID  0  . levothyroxine (SYNTHROID, LEVOTHROID) 175 MCG tablet Take 1 tablet by mouth every evening.  0  . omeprazole (PRILOSEC) 20 MG capsule Take 1 capsule (20 mg total) by mouth daily. (Patient taking differently: Take 20 mg by mouth every morning. ) 30 capsule 3  . simvastatin (ZOCOR) 40 MG tablet Take 1 tablet (40 mg total) by mouth daily. (Patient taking differently: Take 40 mg by mouth every other day. ) 30 tablet 3  . sulfamethoxazole-trimethoprim (BACTRIM DS,SEPTRA DS) 800-160 MG tablet Take 10 tablets by mouth 2 (two) times daily. 10 tablet 0  . temazepam (RESTORIL) 15 MG capsule 2  qhs 60 capsule 3  . traMADol (ULTRAM) 50 MG tablet Take 50 mg by mouth every 6 (six) hours as needed.    . traMADol (ULTRAM) 50 MG tablet Take 1 tablet (50 mg total) by mouth every 6 (six) hours as needed. 20 tablet 0   No current facility-administered medications for this visit.    Facility-Administered Medications Ordered in Other Visits  Medication Dose Route Frequency Provider Last Rate Last Dose  . epirubicin (ELLENCE) 50 mg in sodium chloride 0.9 % bladder instillation  50 mg Bladder Instillation Once Franchot Gallo,  MD        Lab Results: No results found for this or any previous visit (from the past 48 hour(s)).  Physical Findings: AIMS:  , ,  ,  ,    CIWA:    COWS:     Musculoskeletal: Strength & Muscle Tone: within normal limits Gait & Station: normal Patient leans: N/A  Psychiatric Specialty Exam: ROS  There were no vitals taken for this visit.There is no weight on file to calculate BMI.  General Appearance: Casual  Eye Contact::  Good  Speech:  Clear and Coherent  Volume:  Normal  Mood:  NA  Affect:  Congruent  Thought Process:  Coherent  Orientation:  Full (Time, Place, and Person)  Thought Content:  WDL  Suicidal Thoughts:  No  Homicidal Thoughts:  No  Memory:  NA  Judgement:  Good  Insight:  Good  Psychomotor Activity:  Normal  Concentration:  Good  Recall:  Good  Fund of Knowledge:Good  Language: Good  Akathisia:  No  Handed:  Right  AIMS (if indicated):     Assets:  Desire for Improvement  ADL's:  Intact  Cognition: WNL  Sleep:      Treatment Plan Summary: At this time the patient will continue taking Wellbutrin 450 mg. The patient is not taking Haldol but we will go ahead and continue her Abilify and actually increase it from 5-10 mg. The possibility that she is having hallucinations makes it reasonable to increase it. I would not go any higher than this dose at this time. At this time we'll discontinue her Ambien and begin her on Restoril 15 mg 1 or 2 at night for sleep. At this time the patient will continue in her therapy with her therapist at this setting. The patient actively looking for another job. The patient physically is stable. She denies any chest pain or shortness of breath. She denies any neurological  symptoms at this time. This patient to return to see me in 2 months. Hopefully by that time she'll landed new job, her son will continue doing well and her voices will be less from the Abilify.  Charlestine Night Shaylie Eklund 06/21/2015, 4:02 PM

## 2015-07-06 ENCOUNTER — Ambulatory Visit (HOSPITAL_COMMUNITY): Payer: Self-pay | Admitting: Clinical

## 2015-07-17 ENCOUNTER — Ambulatory Visit (INDEPENDENT_AMBULATORY_CARE_PROVIDER_SITE_OTHER): Payer: Medicaid Other | Admitting: Clinical

## 2015-07-17 DIAGNOSIS — F333 Major depressive disorder, recurrent, severe with psychotic symptoms: Secondary | ICD-10-CM | POA: Diagnosis not present

## 2015-07-19 ENCOUNTER — Encounter (HOSPITAL_COMMUNITY): Payer: Self-pay | Admitting: Clinical

## 2015-07-19 NOTE — Progress Notes (Signed)
   THERAPIST PROGRESS NOTE  Session Time: 2:31 - 3:10  Participation Level: Active  Behavioral Response: NeatAlertDepressed   Type of Therapy: Individual Therapy  Treatment Goals addressed: improve psychiatric symptoms, improve unhelpful thinking patterns, reduce hallucinations and/or delusions, interpersonal relationship skills   Interventions: CBT, Motivational Interviewing,   Summary: Raven [name] L. Foxworthy  is a 89.y.o. female who presents with Major Depressive Disorder with psychotic features.  Suicidal/Homicidal: No -without intent/plan  Therapist Response:  Raven [name] met with clinician for an individual session. [name] discussed her psychiatric symptoms and her current life events. [name] shared that the contract for her job has been bought by another company and she will be loosing her job on the 28th. She cried as she shared this. She has already begun the process of taking steps towards finding a new job. She shared that her fears about not having money to provide for herself and her son are overwhelming. [name] shared that her hallucinations have increased again and she has not been able to sleep at night and then finds herself sleeping in the day . She shared at night she hears noises and has the belief that someone s in the house. [name] shared that she has had passive thoughts of suicide. Clinician asked open ended questions and [name] shared that she does not plan to act on the thoughts, but they have been showing up. She shared she recognizes that things can improve and that her son is relying on her and that she is at the same time overwhelmed. She shared that she thought it might be a good idea to go inpatient to get herself "straight". Clinician asked what was preventing her from going and if she would like clinician to help her go now. She shared that she needs someone to take care of her child. Client and clinician discussed who she could call to help with her son. She shared that she  would contact those who might assist with her son and let them know she might need help. Client and clinician made a list of things she would need to do - gather meds, pack a bag, etc. Clinician gave her a list of emergency numbers. [name] shared that she does not plan to harm herself but if her symptoms got worse she would use the numbers. She told clinician the process of being admitted. She shared that contacting those who would help with her son would help her whether or not she needed to go to inpatient. Clinician had [name] list some of the good things in her life. [name] shared that it helps to remember because the depression makes it difficult. Raven [name] and clinician discussed that is important to remember so she can recall what she is working for.   Plan: Return again in 1 weeks.  Diagnosis: Axis I: Severe recurrent major depressive disorder with psychotic features   Nykolas Bacallao A, LCSW 07/17/2015

## 2015-08-14 ENCOUNTER — Encounter (HOSPITAL_COMMUNITY): Payer: Self-pay | Admitting: Clinical

## 2015-08-14 ENCOUNTER — Ambulatory Visit (INDEPENDENT_AMBULATORY_CARE_PROVIDER_SITE_OTHER): Payer: Medicaid Other | Admitting: Clinical

## 2015-08-14 DIAGNOSIS — F333 Major depressive disorder, recurrent, severe with psychotic symptoms: Secondary | ICD-10-CM

## 2015-08-14 NOTE — Progress Notes (Signed)
   THERAPIST PROGRESS NOTE  Session Time: 2:30 - 3:15  Participation Level: Active  Behavioral Response: NeatAlertDepressed  Type of Therapy: Individual Therapy  Treatment Goals addressed: improve psychiatric symptoms, improve unhelpful thinking patterns, reduce hallucinations and/or delusions,  Interventions: CBT, Motivational Interviewing, Grounding and Mindfulness Techniques  Summary: Raven [name]  is a 36.y.o. female who presents with Major Depressive Disorder with psychotic features.  Suicidal/Homicidal: No -without intent/plan  Therapist Response:  Hassan Rowan met with clinician for an individual session. Raven [name] discussed her psychiatric symptoms, her current life events, and her homework. Raven [name] shared that she has been having a difficult time finding a job, though she has been diligently looking. She shared that her sleep pattern is off. She shared that she is having difficulty at night because she has been experiencing a lot of paranoia. She wakes up and checking the house for intruders, leaving lights on. Raven [name] shared she has not been doing the things that have improved her mood in the past. She shared that she has intention but finds it difficult to motivate herself.  Clinician asked open ended questions and [name] identified some of things that she was doing before that was helpful. She shared that before guided meditations and grounding techniques, going to meetings, socializing. Raven [name] shared about her emotion of being overwhelmed. Client and clinician practiced a grounding technique with positive affirmations. Raven [name] shared she felt more relaxed and more confident. Client and clinician reviewed how she could do this on her own. Hassan Rowan and clinician discussed what to do if her symptoms increased. Raven [name] reviewed her plan for her sons care if inpatient is needed.  Plan: Return again in 1 weeks.  Diagnosis: Axis I: Severe recurrent major depressive disorder with psychotic  features   Kennidy Lamke A, LCSW 08/14/2015

## 2015-08-23 ENCOUNTER — Ambulatory Visit (INDEPENDENT_AMBULATORY_CARE_PROVIDER_SITE_OTHER): Payer: Medicaid Other | Admitting: Psychiatry

## 2015-08-23 VITALS — BP 132/80 | HR 86 | Ht 68.0 in | Wt 238.4 lb

## 2015-08-23 DIAGNOSIS — F333 Major depressive disorder, recurrent, severe with psychotic symptoms: Secondary | ICD-10-CM | POA: Diagnosis not present

## 2015-08-23 MED ORDER — BUPROPION HCL ER (XL) 150 MG PO TB24: 450.0000 mg | ORAL_TABLET | Freq: Every morning | ORAL | Status: DC

## 2015-08-23 MED ORDER — TEMAZEPAM 15 MG PO CAPS: ORAL_CAPSULE | ORAL | Status: DC

## 2015-08-23 MED ORDER — ARIPIPRAZOLE 10 MG PO TABS: 10.0000 mg | ORAL_TABLET | Freq: Every day | ORAL | Status: DC

## 2015-08-23 MED ORDER — ZOLPIDEM TARTRATE 10 MG PO TABS: 10.0000 mg | ORAL_TABLET | Freq: Every evening | ORAL | Status: DC | PRN

## 2015-08-23 NOTE — Progress Notes (Signed)
Patient ID: Raven [name], female   DOB: May 24, 1960, 55 y.o.   MRN: [id number] Patient ID: Raven [name], female   DOB: [birth year], 55 y.o.   MRN: [id number] [facility] MD Progress Note  08/23/2015 3:35 PM Raven [name]  MRN:  [id number] Subjective:  Doing well Principal Problem: Major depression with psychotic features This patient seems generally same. Unfortunately she's having difficulty finding a job. 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 She's just to actually find a job. He uses her son is home and doing well. The patient does describe persistent daily depression. She says she still has early morning awakening. The Restoril that she talk unfortunately she says made her become paranoid and she stopped it. The patient still takes Wellbutrin 450 mg. She says overall she does find it helpful. Her energy level is somewhat low but she still is able get out of the house in this job training program. The patient's appetite is normal. The patient says the higher dose of Abilify has helped her sleep longer but still she wakes for the morning. Most importantly the patient still hears which she now describes auditory hallucinations each morning when she wakes where some woman speaking to her and telling her to kill herself. The patient never takes action. The patient is not suicidal. The patient no she's got of the alive for her son and denies any true intent to herself. The patient is still applying for disability. The patient says that the Ambien helped the most. We will consider going back to it. The patient continues in therapy in the setting. This is a 15 minute visit. The patient denies chest pain or shortness of breath. She physically as well. Past Psychiatric History:   Past Medical History:  Past Medical History  Diagnosis Date  . Anxiety   . Hyperlipidemia   . Arthritis     all joints and thumbs  . Type 2 diabetes mellitus ([place])   . Severe recurrent  major depression with psychotic features [facility])     followed by psychologist--  dr Norma Fredrickson  . Bladder tumor   . Post-surgical hypothyroidism   . 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 Wears glasses   . Glaucoma of both eyes   . Wears dentures     upper  . OSA (obstructive sleep apnea)     per pt study 2008  no cpap    Past Surgical History  Procedure Laterality Date  . Thyroidectomy  2004  . Strabismus surgery Bilateral age 29  . Colonoscopy  Nov 2016  . Transurethral resection of bladder tumor N/A 04/28/2015    Procedure: TRANSURETHRAL RESECTION OF BLADDER TUMOR (TURBT) ;  Surgeon: Franchot Gallo, MD;  Location: [facility];  Service: Urology;  Laterality: N/A;   Family History:  Family History  Problem Relation Age of Onset  . Diabetes Mother   . Stroke Mother   . Heart disease Mother   . Cancer Sister   . Alcohol abuse Father   . Colon cancer Father   . Alcohol abuse Sister   . Alcohol abuse Brother   . Drug abuse Brother   . Alcohol abuse Brother   . Drug abuse Brother    Family Psychiatric  History:  Social History:  History  Alcohol Use No     History  Drug Use No  Comment: hx drug use --  in remission since 2003    Social History   Social History  . Marital Status: Single    Spouse Name: N/A  . Number of Children: N/A  . Years of Education: N/A   Social History Main Topics  . Smoking status: Current Every Day Smoker -- 0.25 packs/day for 13 years    Types: Cigarettes  . Smokeless tobacco: Never Used  . Alcohol Use: No  . Drug Use: No     Comment: hx drug use --  in remission since 2003  . Sexual Activity: Not on file   Other Topics Concern  . Not on file   Social History Narrative   Additional Social History:                         Sleep: Fair  Appetite:  Fair  Current Medications: Current Outpatient Prescriptions  Medication Sig Dispense Refill  . ARIPiprazole  (ABILIFY) 10 MG tablet Take 1 tablet (10 mg total) by mouth daily. 1  qhs 30 tablet 4  . Bimatoprost (LUMIGAN OP) Apply to eye at bedtime.    . Brimonidine Tartrate-Timolol (COMBIGAN OP) Apply to eye 3 (three) times daily.    Marland Kitchen buPROPion (WELLBUTRIN XL) 150 MG 24 hr tablet Take 3 tablets (450 mg total) by mouth every morning. 3 qam 90 tablet 4  . haloperidol (HALDOL) 2 MG tablet Take 1 tablet (2 mg total) by mouth at bedtime. (Patient taking differently: Take 2 mg by mouth at bedtime as needed. ) 30 tablet 5  . hydrOXYzine (VISTARIL) 25 MG capsule 1   Tid   2  prn (Patient taking differently: Take 25 mg by mouth. 1   Tid   2  prn) 150 capsule 3  . INVOKAMET [phone] MG TABS TK 1 T PO  BID  0  . levothyroxine (SYNTHROID, LEVOTHROID) 175 MCG tablet Take 1 tablet by mouth every evening.  0  . omeprazole (PRILOSEC) 20 MG capsule Take 1 capsule (20 mg total) by mouth daily. (Patient taking differently: Take 20 mg by mouth every morning. ) 30 capsule 3  . simvastatin (ZOCOR) 40 MG tablet Take 1 tablet (40 mg total) by mouth daily. (Patient taking differently: Take 40 mg by mouth every other day. ) 30 tablet 3  . sulfamethoxazole-trimethoprim (BACTRIM DS,SEPTRA DS) 800-160 MG tablet Take 10 tablets by mouth 2 (two) times daily. 10 tablet 0  . temazepam (RESTORIL) 15 MG capsule 2  qhs 60 capsule 3  . traMADol (ULTRAM) 50 MG tablet Take 50 mg by mouth every 6 (six) hours as needed.    . traMADol (ULTRAM) 50 MG tablet Take 1 tablet (50 mg total) by mouth every 6 (six) hours as needed. 20 tablet 0  . zolpidem (AMBIEN) 10 MG tablet Take 1 tablet (10 mg total) by mouth at bedtime as needed for sleep. 30 tablet 3   No current facility-administered medications for this visit.   Facility-Administered Medications Ordered in Other Visits  Medication Dose Route Frequency Provider Last Rate Last Dose  . epirubicin (ELLENCE) 50 mg in sodium chloride 0.9 % bladder instillation  50 mg Bladder Instillation Once Franchot Gallo, MD        Lab Results: No results found for this or any previous visit (from the past 48 hour(s)).  Physical Findings: AIMS:  , ,  ,  ,    CIWA:    COWS:     Musculoskeletal:  Strength & Muscle Tone: within normal limits Gait & Station: normal Patient leans: N/A  Psychiatric Specialty Exam: ROS  Blood pressure 132/80, pulse 86, height 5\' 8"  (1.727 m), weight 238 lb 6.4 oz (108.138 kg).Body mass index is 36.26 kg/(m^2).  General Appearance: Casual  Eye Contact::  Good  Speech:  Clear and Coherent  Volume:  Normal  Mood:  NA  Affect:  Congruent  Thought Process:  Coherent  Orientation:  Full (Time, Place, and Person)  Thought Content:  WDL  Suicidal Thoughts:  No  Homicidal Thoughts:  No  Memory:  NA  Judgement:  Good  Insight:  Good  Psychomotor Activity:  Normal  Concentration:  Good  Recall:  Good  Fund of Knowledge:Good  Language: Good  Akathisia:  No  Handed:  Right  AIMS (if indicated):     Assets:  Desire for Improvement  ADL's:  Intact  Cognition: WNL  Sleep:      Treatment Plan Summary: At this time the patient will discontinue her Restoril and will take Ambien 10 mg. She'll continue taking Wellbutrin 450 mg and continue on Abilify 10 mg. At this time is difficult to interpret what our next move is. I think if the patient gets a job she'll feel much better. She has not anhedonic. She still enjoys things. For now we'll attempt to get her to have a better night's sleep and getting her Ambien. Her next visit which will be a regular 30 minute visit we'll talk extensively about the benefits of Abilify and perhaps increase it to 15 mg. I do not think this patient is acutely suicidal. Her son is doing well and that's important for this patient. Unfortunately the patient does not seem to have any social life at all. She is nothing romantic going on. She acknowledges she is bored and lonely.

## 2015-09-13 ENCOUNTER — Encounter (HOSPITAL_COMMUNITY): Payer: Self-pay | Admitting: Clinical

## 2015-09-13 ENCOUNTER — Ambulatory Visit (INDEPENDENT_AMBULATORY_CARE_PROVIDER_SITE_OTHER): Payer: Medicaid Other | Admitting: Clinical

## 2015-09-13 DIAGNOSIS — F333 Major depressive disorder, recurrent, severe with psychotic symptoms: Secondary | ICD-10-CM

## 2015-09-13 NOTE — Progress Notes (Signed)
   THERAPIST PROGRESS NOTE  Session Time: 2:30 -3:25  Participation Level: Active  Behavioral Response: CasualAlertDepressed  Type of Therapy: Individual Therapy  Treatment Goals addressed: improve psychiatric symptoms, improve unhelpful thinking patterns, reduce hallucinations and/or delusions, interpersonal relationship skills   Interventions: CBT, Motivational Interviewing, Grounding and Mindfulness Techniques  Summary: Raven [name]  is [name] 29.y.o. female who presents with Major Depressive Disorder with psychotic features.  Suicidal/Homicidal: No -without intent/plan  Therapist Response:  Raven [name] met with clinician for an individual session. Raven [name] discussed her psychiatric symptoms and her current life events. Raven [name] shared that she hasn't been having [name] difficult time lately with her depression. She shared that she is still unable to find [name] job. Client and clinician discussed possibilities that might assist her in finding [name] job. Raven [name] shared that her medication has been assisting her in sleeping so she is not having as many hallucinations waking her up he said it's down from every night to every third night. Raven [name] shared that she continues to isolate which increases her depression. Client and clinician discussed opportunities for her to get out of the house and to have social interaction. Clinician asked open ended questions and Raven [name] identified some of the things she was willing to do. Raven [name] shared that her brother sometimes takes her son out for the day and she is invited to meet them for dinner. She shared that she has done this before and would be open to do it again. Raven [name] stated that she has not gone to 12-step meeting but it is [name] place that she is feels comfortable and might like to do that again. Client and clinician discussed her relationships and the importance of interacting with healthy people.  Plan: Return again in 1 weeks.  Diagnosis: Axis I: Severe recurrent major  depressive disorder with psychotic features   Raven Reaney A, LCSW 09/13/2015

## 2015-09-27 ENCOUNTER — Ambulatory Visit (INDEPENDENT_AMBULATORY_CARE_PROVIDER_SITE_OTHER): Payer: Medicaid Other | Admitting: Clinical

## 2015-09-27 DIAGNOSIS — F333 Major depressive disorder, recurrent, severe with psychotic symptoms: Secondary | ICD-10-CM | POA: Diagnosis not present

## 2015-09-27 NOTE — Progress Notes (Addendum)
THERAPIST PROGRESS NOTE  Session Time: 2:35 - 3:30  Participation Level: Active  Behavioral Response: CasualAlertDepressed  Type of Therapy: Individual Therapy  Treatment Goals addressed: improve psychiatric symptoms, improve unhelpful thinking patterns,   Interventions: Motivational Interviewing,   Summary: Raven [name]  is a 54.y.o. female who presents with Major Depressive Disorder with psychotic features.  Suicidal/Homicidal: No -without intent/plan  Therapist Response:  Raven [name] met with clinician for an individual session. Raven [name] discussed her psychiatric symptoms, her current life events, and her homework. Raven [name] shared that her symptoms have been a bit better than last session. She shared that her hallucinations are down. She shared she is sleeping longer though still waking up at 4 am "with just 2 lights on, instead of all of them on." She shared she is still isolating. 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 She shared she was also rewarded a scholarship for good will classes. Raven [name] discussed some of her worries and concerns about returning to work. Clinician asked open ended questions. Raven [name] identified what it has been like in the past to start new jobs. She shared her insight that she has often been nervous to start something new but that after a short amount of time she get comfortable. Clinician asked open ended questions about other things that concerned her and Raven [name] identified her own solutions to dealing with them in a healthy way. CLinician gave Raven [name] Depression packet 1&2 to cfomplete before next session.     Plan: Return again in 1 weeks.  Diagnosis: Axis I: Severe recurrent major depressive disorder with psychotic features   , A, LCSW 09/27/2015  

## 2015-10-02 ENCOUNTER — Encounter (HOSPITAL_COMMUNITY): Payer: Self-pay | Admitting: Clinical

## 2015-10-11 ENCOUNTER — Ambulatory Visit (HOSPITAL_COMMUNITY): Payer: Self-pay | Admitting: Clinical

## 2015-10-25 ENCOUNTER — Ambulatory Visit (HOSPITAL_COMMUNITY): Payer: Self-pay | Admitting: Clinical

## 2015-11-15 ENCOUNTER — Ambulatory Visit (HOSPITAL_COMMUNITY): Payer: Self-pay | Admitting: Clinical

## 2015-11-24 ENCOUNTER — Ambulatory Visit (HOSPITAL_COMMUNITY): Payer: Self-pay | Admitting: Psychiatry

## 2015-11-29 ENCOUNTER — Ambulatory Visit (INDEPENDENT_AMBULATORY_CARE_PROVIDER_SITE_OTHER): Payer: Medicaid Other | Admitting: Clinical

## 2015-11-29 DIAGNOSIS — F333 Major depressive disorder, recurrent, severe with psychotic symptoms: Secondary | ICD-10-CM

## 2015-11-29 NOTE — Progress Notes (Signed)
   THERAPIST PROGRESS NOTE  Session Time: 9:05 - 10:00  Participation Level: Active  Behavioral Response: CasualAlertAnxious and Depressed  Type of Therapy: Individual Therapy  Treatment Goals addressed: improve psychiatric symptoms, improve unhelpful thinking patterns, reduce hallucinations and/or delusions, interpersonal relationship skills, healthy coping skills.   Interventions: CBT, Motivational Interviewing, Grounding and Mindfulness Techniques  Summary: Raven [name]  is a 68.y.o. female who presents with Major Depressive Disorder with psychotic features.  Suicidal/Homicidal: No -without intent/plan  Therapist Response:  Hassan Rowan met with clinician for an individual session. [name] discussed her psychiatric symptoms and her current life events. [name] shared she has been working at [employer] but does not get home until 1:15. Clinician asked open ended questions about her job - pros and cons. She shared that this is very difficult because she is unable to sleep through the night. She shared it has been nice to be able to do things like pay her co-pay. However she is concerned a bout being able to keep the job because of the hours during off her sleep cycle and of course to troubles with her mental health and hallucinations. [name] works really hard to keep a positive attitude.[name] shared that she continues to have hallucinations but has been dealing with them by leaving a few lights on when she is trying to sleep. Client and clinician discussed healthy coping skills.  She shared that she has been going to church on Sunday's with her son. Hassan Rowan and clinician discussed interacting with others and the benefits of social interaction for her mental health.  Plan: Return again in 1 weeks.  Diagnosis: Axis I: Severe recurrent major depressive disorder with psychotic features    Lakesia Dahle A, LCSW 11/29/2015
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
## 2015-12-05 ENCOUNTER — Encounter (HOSPITAL_COMMUNITY): Payer: Self-pay | Admitting: Clinical

## 2015-12-13 ENCOUNTER — Ambulatory Visit (HOSPITAL_COMMUNITY): Payer: Self-pay | Admitting: Psychiatry

## 2015-12-15 DIAGNOSIS — K219 Gastro-esophageal reflux disease without esophagitis: Secondary | ICD-10-CM | POA: Insufficient documentation

## 2015-12-19 ENCOUNTER — Ambulatory Visit (HOSPITAL_COMMUNITY): Payer: Self-pay | Admitting: Clinical

## 2016-01-02 ENCOUNTER — Ambulatory Visit (HOSPITAL_COMMUNITY): Payer: Self-pay | Admitting: Clinical

## 2016-01-05 ENCOUNTER — Encounter (HOSPITAL_COMMUNITY): Payer: Self-pay | Admitting: Psychiatry

## 2016-01-05 ENCOUNTER — Ambulatory Visit (INDEPENDENT_AMBULATORY_CARE_PROVIDER_SITE_OTHER): Payer: Medicaid Other | Admitting: Psychiatry

## 2016-01-05 VITALS — BP 126/78 | HR 79 | Ht 69.0 in | Wt 243.4 lb

## 2016-01-05 DIAGNOSIS — Z811 Family history of alcohol abuse and dependence: Secondary | ICD-10-CM

## 2016-01-05 DIAGNOSIS — Z813 Family history of other psychoactive substance abuse and dependence: Secondary | ICD-10-CM

## 2016-01-05 DIAGNOSIS — F17210 Nicotine dependence, cigarettes, uncomplicated: Secondary | ICD-10-CM

## 2016-01-05 DIAGNOSIS — Z79899 Other long term (current) drug therapy: Secondary | ICD-10-CM | POA: Diagnosis not present

## 2016-01-05 DIAGNOSIS — F333 Major depressive disorder, recurrent, severe with psychotic symptoms: Secondary | ICD-10-CM

## 2016-01-05 DIAGNOSIS — Z833 Family history of diabetes mellitus: Secondary | ICD-10-CM

## 2016-01-05 DIAGNOSIS — Z800 Family history of malignant neoplasm of digestive organs: Secondary | ICD-10-CM

## 2016-01-05 MED ORDER — ZOLPIDEM TARTRATE ER 12.5 MG PO TBCR: 12.5000 mg | EXTENDED_RELEASE_TABLET | Freq: Every evening | ORAL | 5 refills | Status: DC | PRN

## 2016-01-05 MED ORDER — BUPROPION HCL ER (XL) 150 MG PO TB24: 450.0000 mg | ORAL_TABLET | Freq: Every morning | ORAL | 4 refills | Status: DC

## 2016-01-05 MED ORDER — ARIPIPRAZOLE 10 MG PO TABS: 10.0000 mg | ORAL_TABLET | Freq: Every day | ORAL | 4 refills | Status: DC

## 2016-01-05 NOTE — Progress Notes (Signed)
Patient ID: Raven [name], female   DOB: 04-24-1960, 55 y.o.   MRN: [id number] Patient ID: Raven [name], female   DOB: 10-23-1960, 55 y.o.   MRN: [id number] [facility] [place] MD Progress Note  01/05/2016 9:44 AM Raven [name]  MRN:  [id number] Subjective:  Doing well Principal Problem: Major depression with psychotic fea  Today the patient is doing much better. He found a job. 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 He likes her job. She likes the people she works with and she likes her boss. It is temporary job she works consistently possibly can turn into a permanent job. The patient's mood is much improved. On her last visit she was persistently depressed. She was hearing voices every morning telling her to kill herself. The voices occur only once or twice a week now and are much less. Interestingly for a while she was hearing voices while she was working in [employer]. 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 The patient denies paranoia and denies any visual hallucinations. She still applying for disability. The patient is sleeping a little bit better but she still wakes up earlier than she wants to. She continues taking Wellbutrin as prescribed. She had problems with Restoril and we changed to Ambien 10 mg which she says is helpful. Unfortunately she takes Abilify erratically. Overall the patient is eating well has good energy is enjoying life. Her significant issue is her 98 year old son who seems to act out a lot. He gets in trouble and she's got the system trying to help take care of him. She put him in a respite center for 72 hours and now is back home. He mainly has behavioral issues in fights etc. The patient for self is doing better. She's joined the choir. He's becoming more socially outgoing. The patient denies suicidal aching. She denies any physical complaints. She has no chest pain or  shortness of breath and she is no neurological symptoms. Past Psychiatric History:   Past Medical History:  Past Medical History:  Diagnosis Date  . Anxiety   . Arthritis    all joints and thumbs  . Bladder tumor   . Glaucoma of both eyes   . 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 Hyperlipidemia   . OSA (obstructive sleep apnea)    per pt study 2008  no cpap  . Post-surgical hypothyroidism   . Severe recurrent major depression with psychotic features [facility])    followed by psychologist--  dr Norma Fredrickson  . Type 2 diabetes mellitus ([place])   . Wears dentures    upper  . Wears glasses     Past Surgical History:  Procedure Laterality Date  . COLONOSCOPY  Nov 2016  . STRABISMUS SURGERY Bilateral age 25  . THYROIDECTOMY  2004  . TRANSURETHRAL RESECTION OF BLADDER TUMOR N/A 04/28/2015   Procedure: TRANSURETHRAL RESECTION OF BLADDER TUMOR (TURBT) ;  Surgeon: Franchot Gallo, MD;  Location: [facility];  Service: Urology;  Laterality: N/A;   Family History:  Family History  Problem Relation Age of Onset  . Diabetes Mother   . Stroke Mother   . Heart disease Mother   . Cancer Sister   . Alcohol abuse Father   . Colon cancer Father   . Alcohol abuse Sister   .  Alcohol abuse Brother   . Drug abuse Brother   . Alcohol abuse Brother   . Drug abuse Brother    Family Psychiatric  History:  Social History:  History  Alcohol Use No     History  Drug Use No    Comment: hx drug use --  in remission since 2003    Social History   Social History  . Marital status: Single    Spouse name: N/A  . Number of children: N/A  . Years of education: N/A   Social History Main Topics  . Smoking status: Current Every Day Smoker    Packs/day: 0.25    Years: 13.00    Types: Cigarettes  . Smokeless tobacco: Never Used  . Alcohol use No  . Drug use: No     Comment: hx drug use --  in remission since 2003  . Sexual activity: Not  Asked   Other Topics Concern  . None   Social History Narrative  . None   Additional Social History:                         Sleep: Fair  Appetite:  Fair  Current Medications: Current Outpatient Prescriptions  Medication Sig Dispense Refill  . ARIPiprazole (ABILIFY) 10 MG tablet Take 1 tablet (10 mg total) by mouth daily. 1  qhs 30 tablet 4  . Bimatoprost (LUMIGAN OP) Apply to eye at bedtime.    . Brimonidine Tartrate-Timolol (COMBIGAN OP) Apply to eye 3 (three) times daily.    Marland Kitchen buPROPion (WELLBUTRIN XL) 150 MG 24 hr tablet Take 3 tablets (450 mg total) by mouth every morning. 3 qam 90 tablet 4  . hydrOXYzine (VISTARIL) 25 MG capsule 1   Tid   2  prn (Patient taking differently: Take 25 mg by mouth. 1   Tid   2  prn) 150 capsule 3  . INVOKAMET [phone] MG TABS TK 1 T PO  BID  0  . levothyroxine (SYNTHROID, LEVOTHROID) 175 MCG tablet Take 1 tablet by mouth every evening.  0  . omeprazole (PRILOSEC) 20 MG capsule Take 1 capsule (20 mg total) by mouth daily. (Patient taking differently: Take 20 mg by mouth every morning. ) 30 capsule 3  . simvastatin (ZOCOR) 40 MG tablet Take 1 tablet (40 mg total) by mouth daily. (Patient taking differently: Take 40 mg by mouth every other day. ) 30 tablet 3  . sulfamethoxazole-trimethoprim (BACTRIM DS,SEPTRA DS) 800-160 MG tablet Take 10 tablets by mouth 2 (two) times daily. 10 tablet 0  . traMADol (ULTRAM) 50 MG tablet Take 50 mg by mouth every 6 (six) hours as needed.    . traMADol (ULTRAM) 50 MG tablet Take 1 tablet (50 mg total) by mouth every 6 (six) hours as needed. 20 tablet 0  . zolpidem (AMBIEN CR) 12.5 MG CR tablet Take 1 tablet (12.5 mg total) by mouth at bedtime as needed for sleep. 30 tablet 5  . zolpidem (AMBIEN) 10 MG tablet Take 1 tablet (10 mg total) by mouth at bedtime as needed for sleep. 30 tablet 3   No current facility-administered medications for this visit.    Facility-Administered Medications Ordered in Other  Visits  Medication Dose Route Frequency Provider Last Rate Last Dose  . epirubicin (ELLENCE) 50 mg in sodium chloride 0.9 % bladder instillation  50 mg Bladder Instillation Once Franchot Gallo, MD        Lab Results: No results found for  this or any previous visit (from the past 48 hour(s)).  Physical Findings: AIMS:  , ,  ,  ,    CIWA:    COWS:     Musculoskeletal: Strength & Muscle Tone: within normal limits Gait & Station: normal Patient leans: N/A  Psychiatric Specialty Exam: ROS  Blood pressure 126/78, pulse 79, height 5\' 9"  (1.753 m), weight 243 lb 6.4 oz (110.4 kg).Body mass index is 35.94 kg/m.  General Appearance: Casual  Eye Contact::  Good  Speech:  Clear and Coherent  Volume:  Normal  Mood:  NA  Affect:  Congruent  Thought Process:  Coherent  Orientation:  Full (Time, Place, and Person)  Thought Content:  WDL  Suicidal Thoughts:  No  Homicidal Thoughts:  No  Memory:  NA  Judgement:  Good  Insight:  Good  Psychomotor Activity:  Normal  Concentration:  Good  Recall:  Good  Fund of Knowledge:Good  Language: Good  Akathisia:  No  Handed:  Right  AIMS (if indicated):     Assets:  Desire for Improvement  ADL's:  Intact  Cognition: WNL  Sleep:      Treatment Plan Summary: At this time the patient is significantly improved. As we described last time getting a job with me everything. She was so pleased with herself that she could take today pay her copayment.. She's now paying her bills.overall the patient's mood is much improved at this time our intervention will be to change her 10 mg of Ambien to the dose of 12.5 mg Ambien CR. We asked her to take her Abilify n a regular basis. Apparently she was taking it erratically. She was now educated is taking Abilify were regular basis as the best chances reducing the voices that she hears a couple times a week. Overall her hallucinations are much less. She'll continue taking Abilify 10 mg. Patient continue taking  Wellbutrin 450 mg. This patient continues in psychotherapy with Francee Piccolo. This patient return to see me in 3 months for a 30 minute visit

## 2016-01-16 ENCOUNTER — Ambulatory Visit (HOSPITAL_COMMUNITY): Payer: Self-pay | Admitting: Clinical

## 2016-01-30 ENCOUNTER — Ambulatory Visit (INDEPENDENT_AMBULATORY_CARE_PROVIDER_SITE_OTHER): Payer: Medicaid Other | Admitting: Clinical

## 2016-01-30 ENCOUNTER — Ambulatory Visit (HOSPITAL_COMMUNITY): Payer: Self-pay | Admitting: Clinical

## 2016-01-30 DIAGNOSIS — F333 Major depressive disorder, recurrent, severe with psychotic symptoms: Secondary | ICD-10-CM | POA: Diagnosis not present

## 2016-01-30 NOTE — Progress Notes (Signed)
   THERAPIST PROGRESS NOTE  Session Time: 4:34 -5:30  Participation Level: Active  Behavioral Response: CasualAlertDepressed  Type of Therapy: Individual Therapy  Treatment Goals addressed: improve psychiatric symptoms, improve unhelpful thinking patterns, healthy coping skills  Interventions: CBT, Motivational Interviewing, Grounding and Mindfulness Techniques  Summary: Raven [name]  is a 55.y.o. female who presents with Major Depressive Disorder with psychotic features.  Suicidal/Homicidal: No -without intent/plan  Therapist Response:  Raven [name] met with clinician for an individual session. Raven [name] discussed her psychiatric symptom and her current life events.Raven [name] shared that she was very depressed. She was teary eyed. Raven [name] shared that the job she changed to had stopped. She shared that she was having difficulty getting in touch with anybody who could provide a letter saying her job had been completed. Without the note Raven [name] will suffer financially because the housing authority requires it. Raven [name] explained the note she needed. Clinician asked open ended question and Raven [name] was able to brainstorm additional options to get the required paper. Raven [name] shared that she has a job interview on Thursday with Raven [name]. She shared that she could go back to her last job but she does not think it would be good for her son for her to work at night because he had recently gotten in some trouble. Client and clinician discussed Raven [name]'s stressors and healthy coping skills. Clinician asked open ended questions and Raven [name] identified the things that would decrease her stress - after taking care of "bussiness" and son, She shared that taking her medication (which she states is working better for the most part), doing her breathing exercises, praising god, and keeping her house up. Client and clinician also reviewed her grounding and mindfulness techniques. Client and clinician discussed focusing on  solutions rather than what is not going right. Raven [name] shared that this was helpful  Plan: Return again in 1 weeks.  Diagnosis: Axis I: Severe recurrent major depressive disorder with psychotic features   Dannya Pitkin A, LCSW 01/30/2016

## 2016-02-06 ENCOUNTER — Encounter (HOSPITAL_COMMUNITY): Payer: Self-pay | Admitting: Clinical

## 2016-02-13 ENCOUNTER — Ambulatory Visit (INDEPENDENT_AMBULATORY_CARE_PROVIDER_SITE_OTHER): Payer: Medicaid Other | Admitting: Clinical

## 2016-02-13 DIAGNOSIS — F333 Major depressive disorder, recurrent, severe with psychotic symptoms: Secondary | ICD-10-CM

## 2016-02-13 NOTE — Progress Notes (Signed)
lo  THERAPIST PROGRESS NOTE  Session Time: 4:28 - 4:58    Participation Level: Active  Behavioral Response: CasualAlertDepressed  Type of Therapy: Individual Therapy  Treatment Goals addressed: improve psychiatric symptoms, improve unhelpful thinking patterns, interpersonal relationship skills   Interventions: CBT, Motivational Interviewing, Grounding and Mindfulness Techniques  Summary: Raven [name]  is a 55.y.o. female who presents with Major Depressive Disorder with psychotic features.  Suicidal/Homicidal: No -without intent/plan  Therapist Response:  Hassan Rowan met with clinician for an individual session. Alzora discussed her psychiatric symptoms, her current life events, and her homework. Beau shared that she has been depressed but doing "okay"  In other ways. She shared that she had been focusing on some of her concerns about her son. She also shared that she had to tell him that there is no money for Christmas. She shared that she has contacted the local charities but that they do not  Assist with children over the age 70. Taiylor shared another difficult situation is that her mother's house is going to close. Thresa works very hard to be a good mother. She shared her feelings of grief about her mother.This  influenced her depression. Client and clinician discussed healthy coping skills. Client and clinician discussed honesty with her son, without guilt. Client and clinician discussed using her mindfulness and grounding techniques. Client and clinician reviewed all the good work she has been doing to ensure he and she has the best life possible. Clinician asked open ended questions about how she would handle Christmas. She shared her plans to try her best to accept what she cannot change. Clinician asked open ended questions about what is going "okay" Briona shared that her housing worked out, and that she started a job at [employer]. .     Plan: Return again in 1  weeks.  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

## 2016-02-27 ENCOUNTER — Ambulatory Visit (HOSPITAL_COMMUNITY): Payer: Self-pay | Admitting: Clinical

## 2016-03-12 ENCOUNTER — Ambulatory Visit (INDEPENDENT_AMBULATORY_CARE_PROVIDER_SITE_OTHER): Payer: Medicaid Other | Admitting: Clinical

## 2016-03-12 DIAGNOSIS — F333 Major depressive disorder, recurrent, severe with psychotic symptoms: Secondary | ICD-10-CM

## 2016-03-12 NOTE — Progress Notes (Signed)
   THERAPIST PROGRESS NOTE  Session Time: 4:30 -4:40  Participation Level: Active  Behavioral Response: CasualAlertDepressed  Type of Therapy: Individual Therapy  Treatment Goals addressed: improve psychiatric symptoms, improve unhelpful thinking patterns,   Interventions: CBT, Motivational Interviewing,   Summary: Raven [name]  is a 55.y.o. female who presents with Major Depressive Disorder with psychotic features.  Suicidal/Homicidal: No -without intent/plan  Therapist Response: Raven Rowan met with clinician for an individual session. Raven [name] discussed her psychiatric symptoms. Raven [name] shared that she was struggling with depression which has increased due to life stressors - her job ended and her car broke down. Client and clinician talked briefly.  Clinicians office was very hot and uncomfortable. Raven [name] asked to end the session. Raven [name] denied any thoughts of self harm. Clinician asked open ended questions and Raven [name] identified coping skills and the many times she has succeeded in similar situations. Raven [name] stated though she could not stay for the session she felt glad that she had come.   Plan: Return again in 1 weeks.  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

## 2016-04-02 ENCOUNTER — Ambulatory Visit (INDEPENDENT_AMBULATORY_CARE_PROVIDER_SITE_OTHER): Payer: Medicaid Other | Admitting: Clinical

## 2016-04-02 DIAGNOSIS — F333 Major depressive disorder, recurrent, severe with psychotic symptoms: Secondary | ICD-10-CM | POA: Diagnosis not present

## 2016-04-02 NOTE — Progress Notes (Signed)
   THERAPIST PROGRESS NOTE  Session Time: 2:30 -4:00  Participation Level: Active  Behavioral Response: CasualAlertDepressed  Type of Therapy: Individual Therapy  Treatment Goals addressed: improve psychiatric symptoms, improve unhelpful thinking patterns, reduce hallucinations and/or delusions, healthy coping skills  Interventions: CBT, Motivational Interviewing,  Summary: Raven [name]  is a 55.y.o. female who presents with Major Depressive Disorder with psychotic features.  Suicidal/Homicidal: No -without intent/plan  Therapist Response:  Raven Rowan met with clinician for an individual session. Valinda discussed her psychiatric symptoms, her current life events, and her homework. Tinesha shared that she has a new job. Client and clinician discussed her process. She was very upset when her last job ended. She applied for jobs on line. She shared she practice her cbt skills - challenging her negative automatic thoughts about her desirability (as an employee and beliefs about rejection). Clinician congratulated her on a job well done. Roselee shared that she continues to have difficulty with sleep and paranoia and hallucinations. She shared the paranoia and hallucinationcomes and goes (which is an improvement)  She shared her car is still broken but that her son lent her his car. She discussed her other stressors. Client and clinician discussed healthy coping skills and how to continue to challenge her unhelpful thought patterns.   Plan: Return again in 1 weeks.  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

## 2016-04-10 ENCOUNTER — Ambulatory Visit (HOSPITAL_COMMUNITY): Payer: Self-pay | Admitting: Psychiatry

## 2016-04-16 ENCOUNTER — Ambulatory Visit (HOSPITAL_COMMUNITY): Payer: Self-pay | Admitting: Clinical

## 2016-04-30 ENCOUNTER — Ambulatory Visit (INDEPENDENT_AMBULATORY_CARE_PROVIDER_SITE_OTHER): Payer: Medicaid Other | Admitting: Clinical

## 2016-04-30 ENCOUNTER — Encounter (HOSPITAL_COMMUNITY): Payer: Self-pay | Admitting: Clinical

## 2016-04-30 DIAGNOSIS — F333 Major depressive disorder, recurrent, severe with psychotic symptoms: Secondary | ICD-10-CM

## 2016-04-30 NOTE — Progress Notes (Signed)
   THERAPIST PROGRESS NOTE  Session Time: 3:32 - 4:16  Participation Level: Active  Behavioral Response: CasualAlertDepressed  Type of Therapy: Individual Therapy  Treatment Goals addressed: improve psychiatric symptoms, improve unhelpful thinking patterns,    Interventions:  Motivational Interviewing,   Summary: Raven Chris. Liston  is a 55.y.o. female who presents with Major Depressive Disorder with psychotic features.  Suicidal/Homicidal: No -without intent/plan  Therapist Response:  Hassan Rowan met with clinician for an individual session. Raven [name] discussed her psychiatric symptoms, her current life events. Kenetra shared that he latest job did not work out. She shared that she was unable to do the steps necessary to complete the tasks. She shared she was feeling pretty depressed about her inability to keep a job. She shared that with the stress increase symptoms increased. She shared her son has been doing a bit better and she wants to be able to provide for him. She shared she has been struggling to stay out of bed after he leaves for work. Clinician asked open ended questions and Caydee identified what she would like to do and steps she could take to ensure that she takes those steps even if the steps are small.  Clinician gave Seena a homework packet 1&2 on Self Compassion. Client and clinician discussed how the way she speaks to herself affects how she feels. Mikayla agreed to try the things discussed in session   Plan: Return again in 1 weeks.  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

## 2016-05-14 ENCOUNTER — Ambulatory Visit (INDEPENDENT_AMBULATORY_CARE_PROVIDER_SITE_OTHER): Payer: Medicaid Other | Admitting: Clinical

## 2016-05-14 DIAGNOSIS — F333 Major depressive disorder, recurrent, severe with psychotic symptoms: Secondary | ICD-10-CM

## 2016-05-14 NOTE — Progress Notes (Signed)
   THERAPIST PROGRESS NOTE  Session Time: 3:17 - 4:00  Participation Level: Active  Behavioral Response: CasualAlertDepressed  Type of Therapy: Individual Therapy  Treatment Goals addressed: improve psychiatric symptoms, improve unhelpful thinking patterns, reduce hallucinations and/or delusions,   Interventions: CBT, Motivational Interviewing, Grounding Techniques, psychoeducation  Summary: Raven [name]  is [name] 55.y.o. female who presents with Major Depressive Disorder with psychotic features.  Suicidal/Homicidal: No -without intent/plan  Therapist Response:  Hassan Rowan met with Raven [name] for an individual session. Raven [name] discussed her psychiatric symptoms, her current life events, and her homework. Raven [name] [name] she was having [name] difficult time , feeling depressed. She [name] that she has had [name] difficult time since her job completed she has had difficulty getting out of bed and doing daily tasks.Raven [name] asked questions about her diagnosis. Raven [name] answered her questions including how  thoughts and behaviors could improve her experience. Raven [name] asked open ended questions and Raven [name] her negative thoughts about herself triggered by the job ending. Client and Raven [name] used cbt to challenge her negative thoughts. Raven [name] identified the evidence for and against the negative thoughts. She was then able to formulate healthier alternative thoughts. Raven [name] asked open ended questions and Raven [name] identified the steps she has taken in the past to get jobs. She also identified her motivation to get [name] job. Raven [name] taught her [name] new grounding technique which client and Raven [name] practiced together.  Plan: Return again in 1 -3 weeks.  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

## 2016-05-27 ENCOUNTER — Other Ambulatory Visit (HOSPITAL_COMMUNITY): Payer: Self-pay

## 2016-05-28 ENCOUNTER — Ambulatory Visit (INDEPENDENT_AMBULATORY_CARE_PROVIDER_SITE_OTHER): Payer: Medicaid Other | Admitting: Clinical

## 2016-05-28 ENCOUNTER — Encounter (HOSPITAL_COMMUNITY): Payer: Self-pay | Admitting: Clinical

## 2016-05-28 DIAGNOSIS — F333 Major depressive disorder, recurrent, severe with psychotic symptoms: Secondary | ICD-10-CM

## 2016-05-28 NOTE — Progress Notes (Signed)
   THERAPIST PROGRESS NOTE  Session Time: 3:30 -4:15  Participation Level: Active  Behavioral Response: CasualAlertDepressed  Type of Therapy: Individual Therapy  Treatment Goals addressed: improve psychiatric symptoms, improve unhelpful thinking patterns, reduce hallucinations and/or delusions,   Interventions: Motivational Interviewing,   Summary: Raven Chris. Hardiman  is a 55.y.o. female who presents with Major Depressive Disorder with psychotic features.  Suicidal/Homicidal: No -without intent/plan  Therapist Response:  Hassan Rowan met with clinician for an individual session. Clayton discussed her psychiatric symptoms, her current life events, and her homework. [name] shared that she was very tired and depressed. She shared that she was out of her sleep medication because insurance would not approve it. Night time is when [name]'s hallucinations increase - they also increase with lack of sleep. She has been out for a few days and is really stressed. Clinician agreed to follow up with the nurse after session ( which she did) - the nurse is working on getting her approved for the refill. Clinician asked open ended questions and [name] shared that she had trouble practicing her skills lately. Clinician asked open ended questions and [name] identified the skills learned in therapy. Clinician asked open ended questions and [name] identified the ones that have been helpful in the past. In the list was listening to guided meditations. Client and clinician discussed how this might her feel more rested and decreased her paranoia and hallucinations. She identified other skills she would be willing to use  Plan: Return again in 1 weeks.  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

## 2016-06-05 ENCOUNTER — Ambulatory Visit (INDEPENDENT_AMBULATORY_CARE_PROVIDER_SITE_OTHER): Payer: Medicaid Other | Admitting: Psychiatry

## 2016-06-05 ENCOUNTER — Encounter (HOSPITAL_COMMUNITY): Payer: Self-pay | Admitting: Psychiatry

## 2016-06-05 VITALS — BP 152/90 | HR 101 | Ht 68.0 in | Wt 237.8 lb

## 2016-06-05 DIAGNOSIS — Z811 Family history of alcohol abuse and dependence: Secondary | ICD-10-CM

## 2016-06-05 DIAGNOSIS — F17210 Nicotine dependence, cigarettes, uncomplicated: Secondary | ICD-10-CM | POA: Diagnosis not present

## 2016-06-05 DIAGNOSIS — Z79899 Other long term (current) drug therapy: Secondary | ICD-10-CM

## 2016-06-05 DIAGNOSIS — F333 Major depressive disorder, recurrent, severe with psychotic symptoms: Secondary | ICD-10-CM

## 2016-06-05 DIAGNOSIS — Z813 Family history of other psychoactive substance abuse and dependence: Secondary | ICD-10-CM | POA: Diagnosis not present

## 2016-06-05 MED ORDER — ARIPIPRAZOLE 5 MG PO TABS: 10.0000 mg | ORAL_TABLET | Freq: Every day | ORAL | 5 refills | Status: DC

## 2016-06-05 MED ORDER — BUPROPION HCL ER (XL) 150 MG PO TB24: 450.0000 mg | ORAL_TABLET | Freq: Every morning | ORAL | 4 refills | Status: DC

## 2016-06-05 NOTE — Progress Notes (Signed)
Patient ID: Raven [name], female   DOB: [birth year], 56 y.o.   MRN: [id number] Patient ID: Raven [name], female   DOB: Feb 21, 1961, 56 y.o.   MRN: [id number] [facility] MD Progress Note  06/05/2016 2:10 PM Raven [name]  MRN:  [id number] Subjective:  Doing well Principal Problem: Major depression with psychotic fea Significant change. Unfortunately the patient has lost her previous job and now has a new job working through [employer]. 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 The patient says her voices are somewhat worse. It's interesting that she hears it only out of the left ear. This raises questions of credibility in some ways. This patient I think is severely mentally ill. She doesn't seem to have a cognitive capacity to hold a job. Her last job she became very paranoid. She was frightened of people hiding behind things. The patient considers suicidal regular basis but wouldn't do it because of her son. She feels persistently depressed. She does have a new job and she's trying the best she can. I predict she will not survive it. She watches TV all the time and is very quiet sedentary lifestyle. She doesn't drink any alcohol or use any drugs. She's never made a suicide attempt. Her energy level is somewhat low in her sleep is clearly afflicted. She says that when she takes the Ambien does help but unfortunately has not been able to get it through the system the last 2 weeks. The good news is her son is doing better. He seems to be stable. This patient does well she job that is not happening at this time. She does have a job at this time and she is working hard.  Past Medical History:  Past Medical History:  Diagnosis Date  . Anxiety   . Arthritis    all joints and thumbs  . Bladder tumor   . Glaucoma of both eyes   . 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 Hyperlipidemia   . OSA (obstructive sleep apnea)    per pt study 2008  no cpap   . Post-surgical hypothyroidism   . Severe recurrent major depression with psychotic features [facility])    followed by psychologist--  dr Norma Fredrickson  . Type 2 diabetes mellitus ([place])   . Wears dentures    upper  . Wears glasses     Past Surgical History:  Procedure Laterality Date  . COLONOSCOPY  Nov 2016  . STRABISMUS SURGERY Bilateral age 9  . THYROIDECTOMY  2004  . TRANSURETHRAL RESECTION OF BLADDER TUMOR N/A 04/28/2015   Procedure: TRANSURETHRAL RESECTION OF BLADDER TUMOR (TURBT) ;  Surgeon: Franchot Gallo, MD;  Location: [facility];  Service: Urology;  Laterality: N/A;   Family History:  Family History  Problem Relation Age of Onset  . Diabetes Mother   . Stroke Mother   . Heart disease Mother   . Cancer Sister   . Alcohol abuse Father   . Colon cancer Father   . Alcohol abuse Sister   . Alcohol abuse Brother   . Drug abuse Brother   . Alcohol abuse Brother   . Drug abuse Brother    Family Psychiatric  History:  Social History:  History  Alcohol Use No     History  Drug Use No    Comment: hx drug use --  in remission since 2003    Social History  Social History  . Marital status: Single    Spouse name: N/A  . Number of children: N/A  . Years of education: N/A   Social History Main Topics  . Smoking status: Current Every Day Smoker    Packs/day: 0.25    Years: 13.00    Types: Cigarettes  . Smokeless tobacco: Never Used  . Alcohol use No  . Drug use: No     Comment: hx drug use --  in remission since 2003  . Sexual activity: Not Asked   Other Topics Concern  . None   Social History Narrative  . None   Additional Social History:                         Sleep: Fair  Appetite:  Fair  Current Medications: Current Outpatient Prescriptions  Medication Sig Dispense Refill  . ARIPiprazole (ABILIFY) 5 MG tablet Take 2 tablets (10 mg total) by mouth daily. 1  qam 30 tablet 5  . Bimatoprost (LUMIGAN OP) Apply to eye at  bedtime.    . Brimonidine Tartrate-Timolol (COMBIGAN OP) Apply to eye 3 (three) times daily.    Marland Kitchen buPROPion (WELLBUTRIN XL) 150 MG 24 hr tablet Take 3 tablets (450 mg total) by mouth every morning. 3 qam 90 tablet 4  . hydrOXYzine (VISTARIL) 25 MG capsule 1   Tid   2  prn (Patient taking differently: Take 25 mg by mouth. 1   Tid   2  prn) 150 capsule 3  . levothyroxine (SYNTHROID, LEVOTHROID) 175 MCG tablet Take 1 tablet by mouth every evening.  0  . metFORMIN (GLUCOPHAGE) 500 MG tablet Take by mouth 2 (two) times daily with a meal.    . omeprazole (PRILOSEC) 20 MG capsule Take 1 capsule (20 mg total) by mouth daily. (Patient taking differently: Take 20 mg by mouth every morning. ) 30 capsule 3  . simvastatin (ZOCOR) 40 MG tablet Take 1 tablet (40 mg total) by mouth daily. (Patient taking differently: Take 40 mg by mouth every other day. ) 30 tablet 3  . traMADol (ULTRAM) 50 MG tablet Take 1 tablet (50 mg total) by mouth every 6 (six) hours as needed. 20 tablet 0  . zolpidem (AMBIEN CR) 12.5 MG CR tablet Take 1 tablet (12.5 mg total) by mouth at bedtime as needed for sleep. 30 tablet 5   No current facility-administered medications for this visit.    Facility-Administered Medications Ordered in Other Visits  Medication Dose Route Frequency Provider Last Rate Last Dose  . epirubicin (ELLENCE) 50 mg in sodium chloride 0.9 % bladder instillation  50 mg Bladder Instillation Once Franchot Gallo, MD        Lab Results: No results found for this or any previous visit (from the past 48 hour(s)).  Physical Findings: AIMS:  , ,  ,  ,    CIWA:    COWS:     Musculoskeletal: Strength & Muscle Tone: within normal limits Gait & Station: normal Patient leans: N/A  Psychiatric Specialty Exam: ROS  Blood pressure (!) 152/90, pulse (!) 101, height 5\' 8"  (1.727 m), weight 237 lb 12.8 oz (107.9 kg).Body mass index is 36.16 kg/m.  General Appearance: Casual  Eye Contact::  Good  Speech:  Clear and  Coherent  Volume:  Normal  Mood:  NA  Affect:  Congruent  Thought Process:  Coherent  Orientation:  Full (Time, Place, and Person)  Thought Content:  WDL  Suicidal Thoughts:  No  Homicidal Thoughts:  No  Memory:  NA  Judgement:  Good  Insight:  Good  Psychomotor Activity:  Normal  Concentration:  Good  Recall:  Good  Fund of Knowledge:Good  Language: Good  Akathisia:  No  Handed:  Right  AIMS (if indicated):     Assets:  Desire for Improvement  ADL's:  Intact  Cognition: WNL  Sleep:      Treatment Plan Summary: At this time we are going to go ahead and reduce her Abilify from a dose of 10 mg down to dose 5 mg. I think this is actually improved. Because sometimes a lower dose of Ambien after a number of months is better. She'll continue taking 450 mg of Wellbutrin. Today will work hard to get her back on Ambien 12.5 mg. He is some confusion about the type of Medicaid that she has. The patient describes daily depression for at least 3 reported days out of the week. There some days where she does okay. I do not think she is acutely suicidal at this time. She is reasonably well-dressed. I think she functions fairly well. I do think this patient is inflicted by a chronic severe mental illness. I think she most likely has a diagnosis of schizo affect of disorder

## 2016-06-10 DIAGNOSIS — M1712 Unilateral primary osteoarthritis, left knee: Secondary | ICD-10-CM | POA: Insufficient documentation

## 2016-06-11 ENCOUNTER — Ambulatory Visit (HOSPITAL_COMMUNITY): Payer: Self-pay | Admitting: Clinical

## 2016-06-25 ENCOUNTER — Ambulatory Visit (HOSPITAL_COMMUNITY): Payer: Self-pay | Admitting: Clinical

## 2016-07-09 ENCOUNTER — Ambulatory Visit (HOSPITAL_COMMUNITY): Payer: Self-pay | Admitting: Clinical

## 2016-07-23 ENCOUNTER — Ambulatory Visit (HOSPITAL_COMMUNITY): Payer: Self-pay | Admitting: Clinical

## 2016-08-14 ENCOUNTER — Ambulatory Visit (HOSPITAL_COMMUNITY): Payer: Self-pay | Admitting: Psychiatry

## 2016-08-22 ENCOUNTER — Ambulatory Visit (HOSPITAL_COMMUNITY): Payer: Self-pay | Admitting: Clinical

## 2016-09-05 ENCOUNTER — Ambulatory Visit (HOSPITAL_COMMUNITY): Payer: Self-pay | Admitting: Clinical

## 2016-09-13 ENCOUNTER — Ambulatory Visit (HOSPITAL_COMMUNITY): Payer: Self-pay | Admitting: Psychiatry

## 2016-09-19 ENCOUNTER — Ambulatory Visit (HOSPITAL_COMMUNITY): Payer: Self-pay | Admitting: Clinical

## 2016-10-25 IMAGING — CR DG LUMBAR SPINE COMPLETE 4+V
5 series · 5 of 5 positions shown · non-contrast
Comparison: None.

CLINICAL DATA: Low back pain after fall 02/15/2014.

EXAM:
LUMBAR SPINE - COMPLETE 4+ VIEW

[view not recorded (1 of 5)]
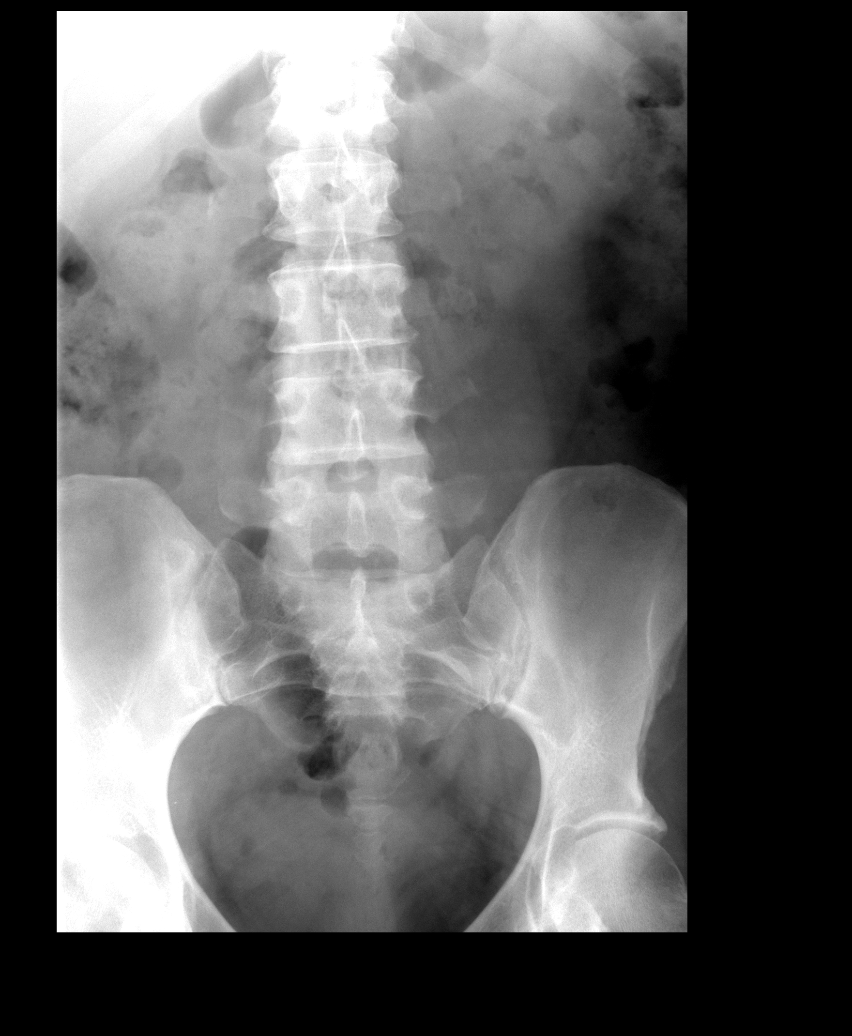

[view not recorded (2 of 5)]
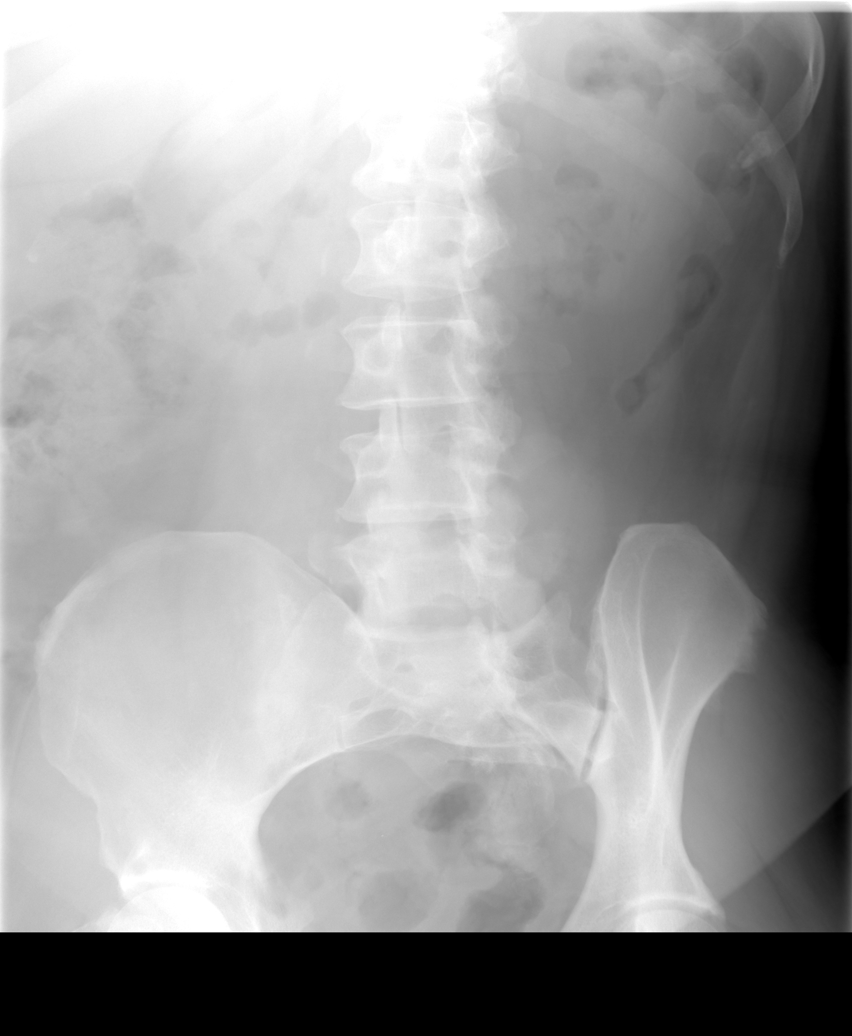

[view not recorded (3 of 5)]
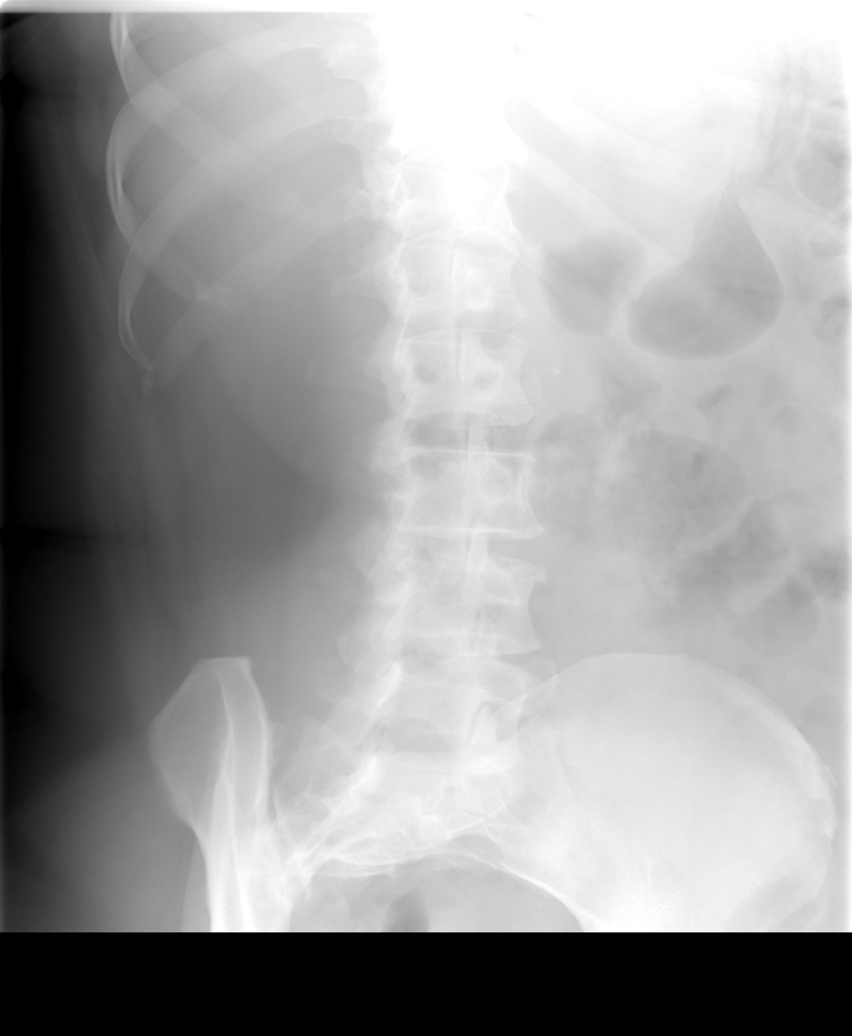

[view not recorded (4 of 5)]
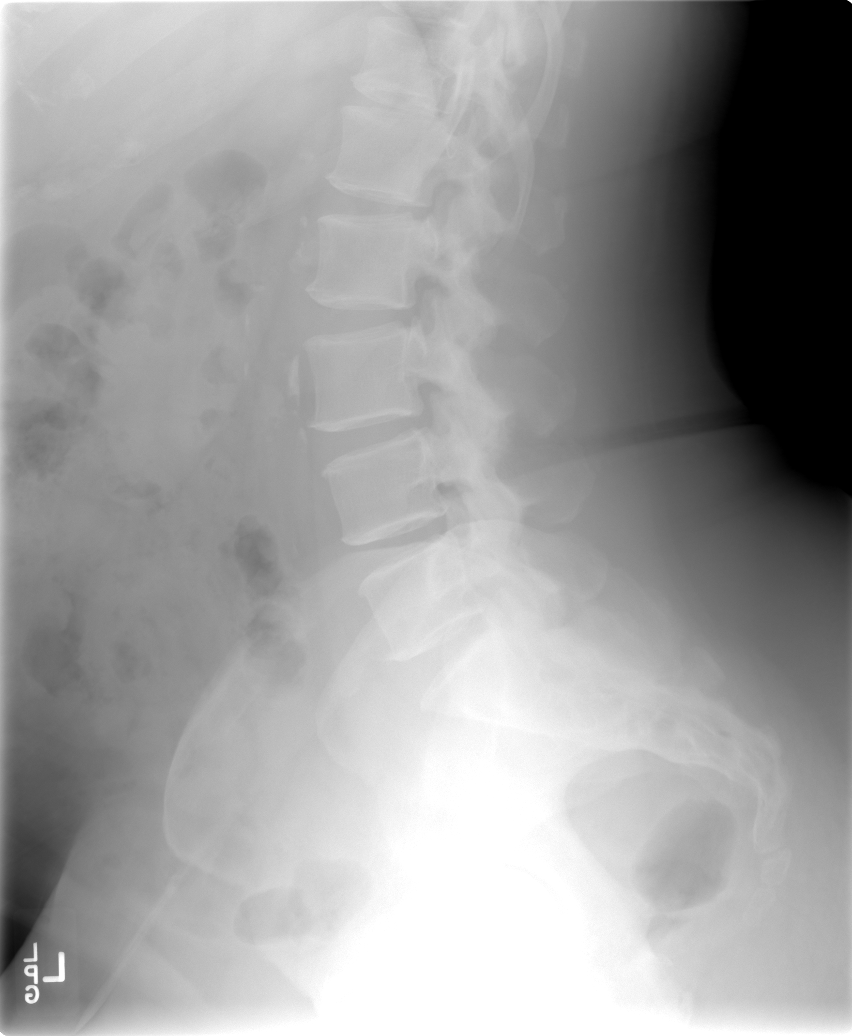

[view not recorded (5 of 5)]
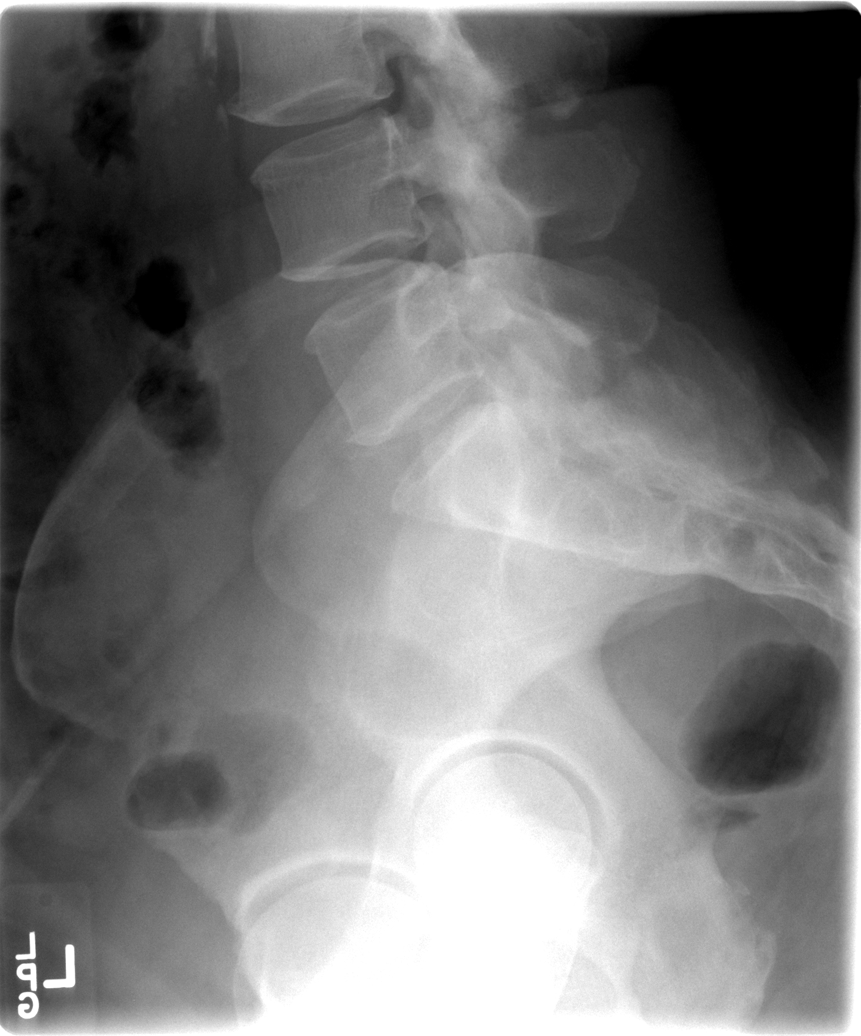

[5 of 5 positions shown; findings below may reference images not displayed]

FINDINGS: The alignment is maintained. Vertebral body heights are normal.
There is no listhesis. The posterior elements are intact. There is
mild diffuse endplate spurring. Mild disc space narrowing at L3-L4,
bone disc spaces are otherwise preserved. There is facet arthropathy
most prominent at L3-L4. No fracture. Sacroiliac joints are
symmetric and normal.
IMPRESSION: 1. No acute fracture or subluxation.
2. Multilevel degenerative change most prominent at L3-L4.

## 2016-11-12 ENCOUNTER — Encounter (HOSPITAL_COMMUNITY): Payer: Self-pay | Admitting: Emergency Medicine

## 2016-11-12 ENCOUNTER — Emergency Department (HOSPITAL_COMMUNITY)
Admission: EM | Admit: 2016-11-12 | Discharge: 2016-11-12 | Disposition: A | Discharge status: Home / Self Care | Payer: Medicaid Other | Attending: Emergency Medicine | Admitting: Emergency Medicine

## 2016-11-12 ENCOUNTER — Emergency Department (HOSPITAL_COMMUNITY): Payer: Medicaid Other

## 2016-11-12 DIAGNOSIS — X58XXXA Exposure to other specified factors, initial encounter: Secondary | ICD-10-CM | POA: Insufficient documentation

## 2016-11-12 DIAGNOSIS — Z79899 Other long term (current) drug therapy: Secondary | ICD-10-CM | POA: Diagnosis not present

## 2016-11-12 DIAGNOSIS — N179 Acute kidney failure, unspecified: Secondary | ICD-10-CM

## 2016-11-12 DIAGNOSIS — Z7984 Long term (current) use of oral hypoglycemic drugs: Secondary | ICD-10-CM | POA: Diagnosis not present

## 2016-11-12 DIAGNOSIS — F17210 Nicotine dependence, cigarettes, uncomplicated: Secondary | ICD-10-CM | POA: Insufficient documentation

## 2016-11-12 DIAGNOSIS — E119 Type 2 diabetes mellitus without complications: Secondary | ICD-10-CM | POA: Diagnosis not present

## 2016-11-12 DIAGNOSIS — S37009A Unspecified injury of unspecified kidney, initial encounter: Secondary | ICD-10-CM | POA: Diagnosis not present

## 2016-11-12 DIAGNOSIS — Y939 Activity, unspecified: Secondary | ICD-10-CM | POA: Diagnosis not present

## 2016-11-12 DIAGNOSIS — R112 Nausea with vomiting, unspecified: Secondary | ICD-10-CM | POA: Diagnosis present

## 2016-11-12 DIAGNOSIS — E860 Dehydration: Secondary | ICD-10-CM | POA: Diagnosis not present

## 2016-11-12 DIAGNOSIS — R109 Unspecified abdominal pain: Secondary | ICD-10-CM | POA: Diagnosis not present

## 2016-11-12 DIAGNOSIS — Y999 Unspecified external cause status: Secondary | ICD-10-CM | POA: Diagnosis not present

## 2016-11-12 DIAGNOSIS — E039 Hypothyroidism, unspecified: Secondary | ICD-10-CM | POA: Diagnosis not present

## 2016-11-12 DIAGNOSIS — Y929 Unspecified place or not applicable: Secondary | ICD-10-CM | POA: Insufficient documentation

## 2016-11-12 LAB — URINALYSIS, ROUTINE W REFLEX MICROSCOPIC
BILIRUBIN URINE: NEGATIVE
GLUCOSE, UA: 50 mg/dL — AB
HGB URINE DIPSTICK: NEGATIVE
KETONES UR: 5 mg/dL — AB
Leukocytes, UA: NEGATIVE
NITRITE: NEGATIVE
PH: 6 (ref 5.0–8.0)
Protein, ur: NEGATIVE mg/dL
SPECIFIC GRAVITY, URINE: 1.009 (ref 1.005–1.030)

## 2016-11-12 LAB — CBC
HEMATOCRIT: 44.7 % (ref 36.0–46.0)
HEMOGLOBIN: 16.2 g/dL — AB (ref 12.0–15.0)
MCH: 32.7 pg (ref 26.0–34.0)
MCHC: 36.2 g/dL — AB (ref 30.0–36.0)
MCV: 90.1 fL (ref 78.0–100.0)
Platelets: 305 10*3/uL (ref 150–400)
RBC: 4.96 MIL/uL (ref 3.87–5.11)
RDW: 12.9 % (ref 11.5–15.5)
WBC: 8.5 10*3/uL (ref 4.0–10.5)

## 2016-11-12 LAB — COMPREHENSIVE METABOLIC PANEL
ALBUMIN: 4.7 g/dL (ref 3.5–5.0)
ALK PHOS: 72 U/L (ref 38–126)
ALT: 34 U/L (ref 14–54)
ANION GAP: 12 (ref 5–15)
AST: 43 U/L — AB (ref 15–41)
BILIRUBIN TOTAL: 0.8 mg/dL (ref 0.3–1.2)
BUN: 19 mg/dL (ref 6–20)
CO2: 25 mmol/L (ref 22–32)
Calcium: 9.5 mg/dL (ref 8.9–10.3)
Chloride: 94 mmol/L — ABNORMAL LOW (ref 101–111)
Creatinine, Ser: 1.74 mg/dL — ABNORMAL HIGH (ref 0.44–1.00)
GFR calc Af Amer: 37 mL/min — ABNORMAL LOW (ref >60)
GFR calc non Af Amer: 32 mL/min — ABNORMAL LOW (ref >60)
GLUCOSE: 157 mg/dL — AB (ref 65–99)
POTASSIUM: 3.5 mmol/L (ref 3.5–5.1)
SODIUM: 131 mmol/L — AB (ref 135–145)
TOTAL PROTEIN: 8 g/dL (ref 6.5–8.1)

## 2016-11-12 LAB — TROPONIN I: Troponin I: <0.03 ng/mL (ref <0.03)

## 2016-11-12 LAB — LIPASE, BLOOD: Lipase: 18 U/L (ref 11–51)

## 2016-11-12 MED ORDER — SODIUM CHLORIDE 0.9 % IV BOLUS (SEPSIS)
1000.0000 mL | Freq: Once | INTRAVENOUS | Status: AC
  Administered 2016-11-12: 1000 mL via INTRAVENOUS

## 2016-11-12 MED ORDER — IOPAMIDOL (ISOVUE-300) INJECTION 61%
INTRAVENOUS | Status: AC
  Administered 2016-11-12: 30 mL via ORAL
  Filled 2016-11-12: qty 30

## 2016-11-12 MED ORDER — IOPAMIDOL (ISOVUE-300) INJECTION 61%
30.0000 mL | Freq: Once | INTRAVENOUS | Status: AC | PRN
  Administered 2016-11-12: 30 mL via ORAL

## 2016-11-12 MED ORDER — IOPAMIDOL (ISOVUE-300) INJECTION 61%
INTRAVENOUS | Status: AC
  Filled 2016-11-12: qty 100

## 2016-11-12 MED ORDER — HYDROMORPHONE HCL 1 MG/ML IJ SOLN
0.5000 mg | Freq: Once | INTRAMUSCULAR | Status: AC
  Administered 2016-11-12: 0.5 mg via INTRAVENOUS
  Filled 2016-11-12: qty 1

## 2016-11-12 NOTE — Discharge Instructions (Signed)
Your creatinine today was 1.73 and was likely from being dehydrated. Call your Dr. Marylene Buerger to schedule an office visit to have your creatinine repeated. Drink plenty of liquids.

## 2016-11-12 NOTE — ED Provider Notes (Signed)
55 year old female signed out to me by Dr. Ashok Cordia after presenting for several day history of vomiting abdominal discomfort. Had evidence of aki which iwas treated with IV fluids. Abdominal CT without acute pathology. Patient feels better at this time. Repeat abdominal exam at time of discharge is stable. Was given follow-up instructions to have her creatinine repeated.   Lacretia Leigh, MD 11/12/16 [phone]

## 2016-11-12 NOTE — ED Provider Notes (Signed)
[facility] DEPT Provider Note   CSN: [id number] Arrival date & time: 11/12/16  0855     History   Chief Complaint Chief Complaint  Patient presents with  . Nausea  . Emesis    HPI Raven [name] is a 55 y.o. female.  Patient c/o nausea and vomiting for the past few days. Emesis episodic, several episodes. Was clear or color ingested fluids. Denies bloody or bilious emesis. Also c/o crampy, moderate, diffuse abd pain.  No diarrhea. Had normal bm yesterday. Denies dysuria or gu c/o. No chest pain or sob. No cough or uri c/o. No fever or chills.    The history is provided by the patient.  Emesis   Associated symptoms include abdominal pain. Pertinent negatives include no diarrhea, no fever and no headaches.    Past Medical History:  Diagnosis Date  . Anxiety   . Arthritis    all joints and thumbs  . Bladder tumor   . Glaucoma of both eyes   . 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 Hyperlipidemia   . OSA (obstructive sleep apnea)    per pt study 2008  no cpap  . Post-surgical hypothyroidism   . Severe recurrent major depression with psychotic features [facility])    followed by psychologist--  dr Norma Fredrickson  . Type 2 diabetes mellitus ([place])   . Wears dentures    upper  . Wears glasses     Patient Active Problem List   Diagnosis Date Noted  . Severe recurrent major depressive disorder with psychotic features ([place]) 10/13/2014  . Diabetes mellitus, type 2 ([place]) 03/03/2014  . Hypothyroidism 03/03/2014    Past Surgical History:  Procedure Laterality Date  . COLONOSCOPY  Nov 2016  . STRABISMUS SURGERY Bilateral age 77  . THYROIDECTOMY  2004  . TRANSURETHRAL RESECTION OF BLADDER TUMOR N/A 04/28/2015   Procedure: TRANSURETHRAL RESECTION OF BLADDER TUMOR (TURBT) ;  Surgeon: Franchot Gallo, MD;  Location: [facility];  Service: Urology;  Laterality: N/A;    OB History    No data available       Home  Medications    Prior to Admission medications   Medication Sig Start Date End Date Taking? Authorizing Provider  ARIPiprazole (ABILIFY) 5 MG tablet Take 2 tablets (10 mg total) by mouth daily. 1  qam 06/05/16 06/05/17  Plovsky, Berneta Sages, MD  Bimatoprost (LUMIGAN OP) Apply to eye at bedtime.    [provider]  Brimonidine Tartrate-Timolol (COMBIGAN OP) Apply to eye 3 (three) times daily.    [provider]  buPROPion (WELLBUTRIN XL) 150 MG 24 hr tablet Take 3 tablets (450 mg total) by mouth every morning. 3 qam 06/05/16   Plovsky, Berneta Sages, MD  hydrOXYzine (VISTARIL) 25 MG capsule 1   Tid   2  prn Patient taking differently: Take 25 mg by mouth. 1   Tid   2  prn 03/22/15   Plovsky, Berneta Sages, MD  levothyroxine (SYNTHROID, LEVOTHROID) 175 MCG tablet Take 1 tablet by mouth every evening. 05/14/14   [provider]  metFORMIN (GLUCOPHAGE) 500 MG tablet Take by mouth 2 (two) times daily with a meal.    [provider]  omeprazole (PRILOSEC) 20 MG capsule Take 1 capsule (20 mg total) by mouth daily. Patient taking differently: Take 20 mg by mouth every morning.  03/31/14   Lance Bosch, NP  simvastatin (ZOCOR) 40 MG tablet Take 1 tablet (40  mg total) by mouth daily. Patient taking differently: Take 40 mg by mouth every other day.  03/23/14   Funches, Adriana Mccallum, MD  traMADol (ULTRAM) 50 MG tablet Take 1 tablet (50 mg total) by mouth every 6 (six) hours as needed. 04/28/15   Franchot Gallo, MD  zolpidem (AMBIEN CR) 12.5 MG CR tablet Take 1 tablet (12.5 mg total) by mouth at bedtime as needed for sleep. 01/05/16 01/04/17  Norma Fredrickson, MD    Family History Family History  Problem Relation Age of Onset  . Diabetes Mother   . Stroke Mother   . Heart disease Mother   . Cancer Sister   . Alcohol abuse Father   . Colon cancer Father   . Alcohol abuse Sister   . Alcohol abuse Brother   . Drug abuse Brother   . Alcohol abuse Brother   . Drug abuse Brother     Social  History Social History  Substance Use Topics  . Smoking status: Current Every Day Smoker    Packs/day: 0.25    Years: 13.00    Types: Cigarettes  . Smokeless tobacco: Never Used  . Alcohol use No     Allergies   Patient has no known allergies.   Review of Systems Review of Systems  Constitutional: Negative for fever.  HENT: Negative for sore throat.   Eyes: Negative for redness.  Respiratory: Negative for shortness of breath.   Cardiovascular: Negative for chest pain.  Gastrointestinal: Positive for abdominal pain and vomiting. Negative for diarrhea.  Endocrine: Negative for polyuria.  Genitourinary: Negative for dysuria and flank pain.  Musculoskeletal: Negative for back pain and neck pain.  Skin: Negative for rash.  Neurological: Negative for headaches.  Hematological: Does not bruise/bleed easily.  Psychiatric/Behavioral: Negative for confusion.     Physical Exam Updated Vital Signs BP 102/73 (BP Location: Right Arm)   Pulse 85   Temp 98.2 F (36.8 C) (Oral)   Resp 16   LMP  (LMP Unknown)   SpO2 100%   Physical Exam  Constitutional: She appears well-developed and well-nourished. No distress.  HENT:  Mouth/Throat: Oropharynx is clear and moist.  Eyes: Conjunctivae are normal. No scleral icterus.  Neck: Neck supple. No tracheal deviation present.  Cardiovascular: Normal rate, regular rhythm, normal heart sounds and intact distal pulses.  Exam reveals no gallop and no friction rub.   No murmur heard. Pulmonary/Chest: Effort normal and breath sounds normal. No respiratory distress.  Abdominal: Soft. Normal appearance and bowel sounds are normal. She exhibits no distension and no mass. There is no tenderness. There is no rebound and no guarding. No hernia.  Mid abdominal tenderness, moderate.   Genitourinary:  Genitourinary Comments: No cva tenderness  Musculoskeletal: She exhibits no edema.  Neurological: She is alert.  Skin: Skin is warm and dry. No rash  noted. She is not diaphoretic.  Psychiatric: She has a normal mood and affect.  Nursing note and vitals reviewed.    ED Treatments / Results  Labs (all labs ordered are listed, but only abnormal results are displayed) Results for orders placed or performed during the hospital encounter of 11/12/16  Lipase, blood  Result Value Ref Range   Lipase 18 11 - 51 U/L  Comprehensive metabolic panel  Result Value Ref Range   Sodium 131 (L) 135 - 145 mmol/L   Potassium 3.5 3.5 - 5.1 mmol/L   Chloride 94 (L) 101 - 111 mmol/L   CO2 25 22 - 32 mmol/L   Glucose, Bld  157 (H) 65 - 99 mg/dL   BUN 19 6 - 20 mg/dL   Creatinine, Ser 1.74 (H) 0.44 - 1.00 mg/dL   Calcium 9.5 8.9 - 10.3 mg/dL   Total Protein 8.0 6.5 - 8.1 g/dL   Albumin 4.7 3.5 - 5.0 g/dL   AST 43 (H) 15 - 41 U/L   ALT 34 14 - 54 U/L   Alkaline Phosphatase 72 38 - 126 U/L   Total Bilirubin 0.8 0.3 - 1.2 mg/dL   GFR calc non Af Amer 32 (L) >60 mL/min   GFR calc Af Amer 37 (L) >60 mL/min   Anion gap 12 5 - 15  CBC  Result Value Ref Range   WBC 8.5 4.0 - 10.5 K/uL   RBC 4.96 3.87 - 5.11 MIL/uL   Hemoglobin 16.2 (H) 12.0 - 15.0 g/dL   HCT 44.7 36.0 - 46.0 %   MCV 90.1 78.0 - 100.0 fL   MCH 32.7 26.0 - 34.0 pg   MCHC 36.2 (H) 30.0 - 36.0 g/dL   RDW 12.9 11.5 - 15.5 %   Platelets 305 150 - 400 K/uL    EKG  EKG Interpretation None       Radiology No results found.  Procedures Procedures (including critical care time)  Medications Ordered in ED Medications  sodium chloride 0.9 % bolus 1,000 mL (not administered)  sodium chloride 0.9 % bolus 1,000 mL (1,000 mLs Intravenous New Bag/Given 11/12/16 1052)     Initial Impression / Assessment and Plan / ED Course  I have reviewed the triage vital signs and the nursing notes.  Pertinent labs & imaging results that were available during my care of the patient were reviewed by me and considered in my medical decision making (see chart for details).  Labs.   Iv ns  bolus.  Reviewed nursing notes and prior charts for additional history.   Persistent abd pain/tenderness. Will get ct.  Creatinine elvavated - will get no iv contrast ct.   1605 CT pending - signed out to Dr Zenia Resides to check ct and dispo appropriately.   Final Clinical Impressions(s) / ED Diagnoses   Final diagnoses:  None    New Prescriptions New Prescriptions   No medications on file     Lajean Saver, MD 11/12/16 [phone]

## 2016-11-12 NOTE — ED Triage Notes (Signed)
Per EMS-states N/V and abdominal discomfort since last week-states she has not been taking any of her meds in 3 months-states she doesn't like the way they make her feel-CBG 180-

## 2016-11-27 ENCOUNTER — Ambulatory Visit (INDEPENDENT_AMBULATORY_CARE_PROVIDER_SITE_OTHER): Payer: Medicaid Other | Admitting: Psychiatry

## 2016-11-27 VITALS — BP 118/74 | HR 89 | Ht 69.0 in | Wt 199.0 lb

## 2016-11-27 DIAGNOSIS — F17210 Nicotine dependence, cigarettes, uncomplicated: Secondary | ICD-10-CM | POA: Diagnosis not present

## 2016-11-27 DIAGNOSIS — F322 Major depressive disorder, single episode, severe without psychotic features: Secondary | ICD-10-CM

## 2016-11-27 DIAGNOSIS — F329 Major depressive disorder, single episode, unspecified: Secondary | ICD-10-CM

## 2016-11-27 MED ORDER — ARIPIPRAZOLE 5 MG PO TABS: 10.0000 mg | ORAL_TABLET | Freq: Every day | ORAL | 1 refills | Status: DC

## 2016-11-27 MED ORDER — BUPROPION HCL ER (XL) 150 MG PO TB24: 450.0000 mg | ORAL_TABLET | Freq: Every morning | ORAL | 2 refills | Status: DC

## 2016-11-27 MED ORDER — ARIPIPRAZOLE 5 MG PO TABS: 5.0000 mg | ORAL_TABLET | Freq: Every day | ORAL | 1 refills | Status: DC

## 2016-11-27 NOTE — Progress Notes (Signed)
Patient ID: Raven [name], female   DOB: 11-Feb-1961, 56 y.o.   MRN: [id number] Patient ID: Raven [name], female   DOB: [birth year], 56 y.o.   MRN: [id number] [facility] MD Progress Note  11/27/2016 5:05 PM Raven [name]  MRN:  [id number] Subjective:  Doing well Principal Problem: Major depression with psychotic fea This patient 15 minutes late for a 15 minute visit. I decided to see her for moving just to check her urine output she is drinking using drugs. She's been sober for just about 2 days and she goes to NA. She hasn't seen months. She is not overtly psychotic but she wants to get back on her medicines. The patient is very distressed. We make plans for her to go [facility] return to see me in about 5 weeks. She's not suicidal. She is lots, factors including that she is charges against her.  Past Medical History:  Past Medical History:  Diagnosis Date  . Anxiety   . Arthritis    all joints and thumbs  . Bladder tumor   . Glaucoma of both eyes   . 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 Hyperlipidemia   . OSA (obstructive sleep apnea)    per pt study 2008  no cpap  . Post-surgical hypothyroidism   . Severe recurrent major depression with psychotic features [facility])    followed by psychologist--  dr Norma Fredrickson  . Type 2 diabetes mellitus ([place])   . Wears dentures    upper  . Wears glasses     Past Surgical History:  Procedure Laterality Date  . COLONOSCOPY  Nov 2016  . STRABISMUS SURGERY Bilateral age 20  . THYROIDECTOMY  2004  . TRANSURETHRAL RESECTION OF BLADDER TUMOR N/A 04/28/2015   Procedure: TRANSURETHRAL RESECTION OF BLADDER TUMOR (TURBT) ;  Surgeon: Franchot Gallo, MD;  Location: [facility];  Service: Urology;  Laterality: N/A;   Family History:  Family History  Problem Relation Age of Onset  . Diabetes Mother   . Stroke Mother   . Heart disease Mother   . Cancer Sister   . Alcohol abuse Father    . Colon cancer Father   . Alcohol abuse Sister   . Alcohol abuse Brother   . Drug abuse Brother   . Alcohol abuse Brother   . Drug abuse Brother    Family Psychiatric  History:  Social History:  History  Alcohol Use No     History  Drug Use No    Comment: hx drug use --  in remission since 2003    Social History   Social History  . Marital status: Single    Spouse name: N/A  . Number of children: N/A  . Years of education: N/A   Social History Main Topics  . Smoking status: Current Every Day Smoker    Packs/day: 0.25    Years: 13.00    Types: Cigarettes  . Smokeless tobacco: Never Used  . Alcohol use No  . Drug use: No     Comment: hx drug use --  in remission since 2003  . Sexual activity: Not on file   Other Topics Concern  . Not on file   Social History Narrative  . No narrative on file   Additional Social History:  Sleep: Fair  Appetite:  Fair  Current Medications: Current Outpatient Prescriptions  Medication Sig Dispense Refill  . ARIPiprazole (ABILIFY) 5 MG tablet Take 1 tablet (5 mg total) by mouth daily. 1  qam 30 tablet 1  . Bimatoprost (LUMIGAN OP) Apply to eye at bedtime.    . Brimonidine Tartrate-Timolol (COMBIGAN OP) Apply to eye 3 (three) times daily.    Marland Kitchen buPROPion (WELLBUTRIN XL) 150 MG 24 hr tablet Take 3 tablets (450 mg total) by mouth every morning. 3 qam 30 tablet 2  . hydrOXYzine (VISTARIL) 25 MG capsule 1   Tid   2  prn (Patient not taking: Reported on 11/12/2016) 150 capsule 3  . levothyroxine (SYNTHROID, LEVOTHROID) 175 MCG tablet Take 1 tablet by mouth every evening.  0  . metFORMIN (GLUCOPHAGE) 500 MG tablet Take by mouth 2 (two) times daily with a meal.    . omeprazole (PRILOSEC) 20 MG capsule Take 1 capsule (20 mg total) by mouth daily. (Patient not taking: Reported on 11/12/2016) 30 capsule 3  . simvastatin (ZOCOR) 40 MG tablet Take 1 tablet (40 mg total) by mouth daily. (Patient not taking:  Reported on 11/12/2016) 30 tablet 3  . traMADol (ULTRAM) 50 MG tablet Take 1 tablet (50 mg total) by mouth every 6 (six) hours as needed. (Patient not taking: Reported on 11/12/2016) 20 tablet 0  . zolpidem (AMBIEN CR) 12.5 MG CR tablet Take 1 tablet (12.5 mg total) by mouth at bedtime as needed for sleep. (Patient not taking: Reported on 11/12/2016) 30 tablet 5   No current facility-administered medications for this visit.    Facility-Administered Medications Ordered in Other Visits  Medication Dose Route Frequency Provider Last Rate Last Dose  . epirubicin (ELLENCE) 50 mg in sodium chloride 0.9 % bladder instillation  50 mg Bladder Instillation Once Franchot Gallo, MD        Lab Results: No results found for this or any previous visit (from the past 48 hour(s)).  Physical Findings: AIMS:  , ,  ,  ,    CIWA:    COWS:     Musculoskeletal: Strength & Muscle Tone: within normal limits Gait & Station: normal Patient leans: N/A  Psychiatric Specialty Exam: ROS  There were no vitals taken for this visit.There is no height or weight on file to calculate BMI.  General Appearance: Casual  Eye Contact::  Good  Speech:  Clear and Coherent  Volume:  Normal  Mood:  NA  Affect:  Congruent  Thought Process:  Coherent  Orientation:  Full (Time, Place, and Person)  Thought Content:  WDL  Suicidal Thoughts:  No  Homicidal Thoughts:  No  Memory:  NA  Judgement:  Good  Insight:  Good  Psychomotor Activity:  Normal  Concentration:  Good  Recall:  Good  Fund of Knowledge:Good  Language: Good  Akathisia:  No  Handed:  Right  AIMS (if indicated):     Assets:  Desire for Improvement  ADL's:  Intact  Cognition: WNL  Sleep:      Treatment Plan Summary: At this time the patient will get back on Abilify 5 mg and start 150 mg Wellbutrin. The patient was told she cannot medicines. I'm doing can't get her back into treatment. She'll [facility] she was given pamphlets  and she's made a commitment to try. Patient is NA the first a few days. [place] to get her back treatment anyway even if it means start low-dose medications. The patient  return to see me in about 5 weeks. We talked about the hospital but she doesn't want do that she needs the help her son. I do not think she is acutely suicidal.

## 2017-01-08 ENCOUNTER — Ambulatory Visit (HOSPITAL_COMMUNITY): Payer: Medicaid Other | Admitting: Psychiatry

## 2017-03-06 ENCOUNTER — Telehealth (HOSPITAL_COMMUNITY): Payer: Self-pay

## 2017-03-06 NOTE — Telephone Encounter (Signed)
Medication refill request - Fax received from patient's [facility] drug for a new Ambien CR 12.5 mg prescription.  Patient last seen 11/27/16 and no current follow up appointments scheduled

## 2017-03-26 ENCOUNTER — Encounter (HOSPITAL_COMMUNITY): Payer: Self-pay | Admitting: Family Medicine

## 2017-03-26 ENCOUNTER — Other Ambulatory Visit: Payer: Self-pay

## 2017-03-26 ENCOUNTER — Emergency Department (HOSPITAL_COMMUNITY)
Admission: EM | Admit: 2017-03-26 | Discharge: 2017-03-26 | Disposition: A | Discharge status: Home / Self Care | Payer: Medicare Other | Attending: Emergency Medicine | Admitting: Emergency Medicine

## 2017-03-26 DIAGNOSIS — I10 Essential (primary) hypertension: Secondary | ICD-10-CM | POA: Diagnosis not present

## 2017-03-26 DIAGNOSIS — R112 Nausea with vomiting, unspecified: Secondary | ICD-10-CM

## 2017-03-26 DIAGNOSIS — F17210 Nicotine dependence, cigarettes, uncomplicated: Secondary | ICD-10-CM | POA: Diagnosis not present

## 2017-03-26 DIAGNOSIS — Z79899 Other long term (current) drug therapy: Secondary | ICD-10-CM | POA: Insufficient documentation

## 2017-03-26 LAB — CBC
HEMATOCRIT: 49.2 % — AB (ref 36.0–46.0)
Hemoglobin: 17.1 g/dL — ABNORMAL HIGH (ref 12.0–15.0)
MCH: 32.2 pg (ref 26.0–34.0)
MCHC: 34.8 g/dL (ref 30.0–36.0)
MCV: 92.7 fL (ref 78.0–100.0)
PLATELETS: 297 10*3/uL (ref 150–400)
RBC: 5.31 MIL/uL — ABNORMAL HIGH (ref 3.87–5.11)
RDW: 14.6 % (ref 11.5–15.5)
WBC: 7.9 10*3/uL (ref 4.0–10.5)

## 2017-03-26 LAB — COMPREHENSIVE METABOLIC PANEL
ALBUMIN: 5 g/dL (ref 3.5–5.0)
ALT: 25 U/L (ref 14–54)
AST: 39 U/L (ref 15–41)
Alkaline Phosphatase: 71 U/L (ref 38–126)
Anion gap: 11 (ref 5–15)
BUN: 17 mg/dL (ref 6–20)
CHLORIDE: 97 mmol/L — AB (ref 101–111)
CO2: 28 mmol/L (ref 22–32)
CREATININE: 1.13 mg/dL — AB (ref 0.44–1.00)
Calcium: 9.9 mg/dL (ref 8.9–10.3)
GFR calc Af Amer: >60 mL/min (ref >60)
GFR, EST NON AFRICAN AMERICAN: 53 mL/min — AB (ref >60)
GLUCOSE: 143 mg/dL — AB (ref 65–99)
Potassium: 4 mmol/L (ref 3.5–5.1)
SODIUM: 136 mmol/L (ref 135–145)
Total Bilirubin: 0.6 mg/dL (ref 0.3–1.2)
Total Protein: 9 g/dL — ABNORMAL HIGH (ref 6.5–8.1)

## 2017-03-26 LAB — URINALYSIS, ROUTINE W REFLEX MICROSCOPIC
BILIRUBIN URINE: NEGATIVE
Glucose, UA: >=500 mg/dL — AB
Hgb urine dipstick: NEGATIVE
Ketones, ur: 20 mg/dL — AB
Leukocytes, UA: NEGATIVE
Nitrite: NEGATIVE
Protein, ur: 100 mg/dL — AB
SPECIFIC GRAVITY, URINE: 1.022 (ref 1.005–1.030)
SQUAMOUS EPITHELIAL / LPF: NONE SEEN
pH: 5 (ref 5.0–8.0)

## 2017-03-26 LAB — TROPONIN I

## 2017-03-26 LAB — LIPASE, BLOOD: LIPASE: 23 U/L (ref 11–51)

## 2017-03-26 MED ORDER — SODIUM CHLORIDE 0.9 % IV BOLUS (SEPSIS)
1000.0000 mL | Freq: Once | INTRAVENOUS | Status: AC
Start: 2017-03-26 — End: 2017-03-26
  Administered 2017-03-26: 1000 mL via INTRAVENOUS

## 2017-03-26 MED ORDER — ONDANSETRON HCL 4 MG/2ML IJ SOLN
4.0000 mg | Freq: Once | INTRAMUSCULAR | Status: DC
  Filled 2017-03-26: qty 2

## 2017-03-26 MED ORDER — AMLODIPINE BESYLATE 5 MG PO TABS: 5.0000 mg | ORAL_TABLET | Freq: Every day | ORAL | 0 refills | Status: DC

## 2017-03-26 MED ORDER — HALOPERIDOL LACTATE 5 MG/ML IJ SOLN
3.0000 mg | Freq: Once | INTRAMUSCULAR | Status: AC
  Administered 2017-03-26: 3 mg via INTRAVENOUS
  Filled 2017-03-26: qty 1

## 2017-03-26 MED ORDER — ONDANSETRON 4 MG PO TBDP: 4.0000 mg | ORAL_TABLET | Freq: Three times a day (TID) | ORAL | 0 refills | Status: DC | PRN

## 2017-03-26 NOTE — ED Triage Notes (Signed)
Patient reports she has been vomiting "dark stuff" for 1.5 days. Also, complains of lower abd pain. Denies fever, diarrhea, or urinary symptoms.

## 2017-03-26 NOTE — ED Provider Notes (Addendum)
[facility] DEPT Provider Note   CSN: [id number] Arrival date & time: 03/26/17  1055     History   Chief Complaint Chief Complaint  Patient presents with  . Vomiting    HPI Raven [name] is a 57 y.o. female.  HPI   Throwing up for last 2 days, about 4 times per day. Haven't been able to keep anything. Has not had anything for nausea.  No Diarrhea, no black or bloody stools. Some constipation, last BM was a few days ago. Not sure if passing flatus.  Lower abdominal pain, cramping.  Comes and goes.  No urinary symptoms.   No recent changes in medications.  Has had similar symptoms 6-63mos ago No chest pain, no cough no fever.  Hx of TURBT, no other intraabdominal surgery Recent cocaine and marijuana use  Past Medical History:  Diagnosis Date  . Anxiety   . Arthritis    all joints and thumbs  . Bladder tumor   . Glaucoma of both eyes   . 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 Hyperlipidemia   . OSA (obstructive sleep apnea)    per pt study 2008  no cpap  . Post-surgical hypothyroidism   . Severe recurrent major depression with psychotic features [facility])    followed by psychologist--  dr Norma Fredrickson  . Type 2 diabetes mellitus ([place])   . Wears dentures    upper  . Wears glasses     Patient Active Problem List   Diagnosis Date Noted  . Severe recurrent major depressive disorder with psychotic features ([place]) 10/13/2014  . Diabetes mellitus, type 2 ([place]) 03/03/2014  . Hypothyroidism 03/03/2014    Past Surgical History:  Procedure Laterality Date  . COLONOSCOPY  Nov 2016  . STRABISMUS SURGERY Bilateral age 45  . THYROIDECTOMY  2004  . TRANSURETHRAL RESECTION OF BLADDER TUMOR N/A 04/28/2015   Procedure: TRANSURETHRAL RESECTION OF BLADDER TUMOR (TURBT) ;  Surgeon: Franchot Gallo, MD;  Location: [facility];  Service: Urology;  Laterality: N/A;    OB History    No data available         Home Medications    Prior to Admission medications   Medication Sig Start Date End Date Taking? Authorizing Provider  ARIPiprazole (ABILIFY) 5 MG tablet Take 1 tablet (5 mg total) by mouth daily. 1  qam 11/27/16 11/27/17  Plovsky, Berneta Sages, MD  Bimatoprost (LUMIGAN OP) Apply to eye at bedtime.    [provider]  Brimonidine Tartrate-Timolol (COMBIGAN OP) Apply to eye 3 (three) times daily.    [provider]  buPROPion (WELLBUTRIN XL) 150 MG 24 hr tablet Take 3 tablets (450 mg total) by mouth every morning. 3 qam 11/27/16   Plovsky, Berneta Sages, MD  hydrOXYzine (VISTARIL) 25 MG capsule 1   Tid   2  prn Patient not taking: Reported on 11/12/2016 03/22/15   Norma Fredrickson, MD  levothyroxine (SYNTHROID, LEVOTHROID) 175 MCG tablet Take 1 tablet by mouth every evening. 05/14/14   [provider]  metFORMIN (GLUCOPHAGE) 500 MG tablet Take by mouth 2 (two) times daily with a meal.    [provider]  omeprazole (PRILOSEC) 20 MG capsule Take 1 capsule (20 mg total) by mouth daily. Patient not taking: Reported on 11/12/2016 03/31/14   Lance Bosch, NP  simvastatin (ZOCOR) 40 MG tablet Take 1 tablet (40 mg total) by mouth daily. Patient not taking: Reported  on 11/12/2016 03/23/14   Boykin Nearing, MD  traMADol (ULTRAM) 50 MG tablet Take 1 tablet (50 mg total) by mouth every 6 (six) hours as needed. Patient not taking: Reported on 11/12/2016 04/28/15   Franchot Gallo, MD  zolpidem (AMBIEN CR) 12.5 MG CR tablet Take 1 tablet (12.5 mg total) by mouth at bedtime as needed for sleep. Patient not taking: Reported on 11/12/2016 01/05/16 01/04/17  Norma Fredrickson, MD    Family History Family History  Problem Relation Age of Onset  . Diabetes Mother   . Stroke Mother   . Heart disease Mother   . Cancer Sister   . Alcohol abuse Father   . Colon cancer Father   . Alcohol abuse Sister   . Alcohol abuse Brother   . Drug abuse Brother   . Alcohol abuse Brother   . Drug  abuse Brother     Social History Social History   Tobacco Use  . Smoking status: Current Every Day Smoker    Packs/day: 0.25    Years: 13.00    Pack years: 3.25    Types: Cigarettes  . Smokeless tobacco: Never Used  Substance Use Topics  . Alcohol use: No    Alcohol/week: 0.0 oz  . Drug use: No    Comment: hx drug use --  in remission since 2003     Allergies   Patient has no known allergies.   Review of Systems Review of Systems  Constitutional: Negative for fever.  HENT: Negative for sore throat.   Eyes: Negative for visual disturbance.  Respiratory: Negative for cough and shortness of breath.   Cardiovascular: Negative for chest pain.  Gastrointestinal: Positive for nausea and vomiting. Negative for abdominal pain.  Genitourinary: Negative for difficulty urinating.  Musculoskeletal: Negative for back pain and neck pain.  Skin: Negative for rash.  Neurological: Negative for syncope and headaches.     Physical Exam Updated Vital Signs BP (!) 195/102 (BP Location: Left Arm)   Pulse 86   Temp 98.8 F (37.1 C) (Oral)   Resp 17   Ht 5\' 8"  (1.727 m)   Wt 75.8 kg (167 lb)   LMP  (LMP Unknown)   SpO2 96%   BMI 25.39 kg/m   Physical Exam  Constitutional: She is oriented to person, place, and time. She appears well-developed and well-nourished. No distress.  HENT:  Head: Normocephalic and atraumatic.  Eyes: Conjunctivae and EOM are normal.  Neck: Normal range of motion.  Cardiovascular: Normal rate, regular rhythm, normal heart sounds and intact distal pulses. Exam reveals no gallop and no friction rub.  No murmur heard. Pulmonary/Chest: Effort normal and breath sounds normal. No respiratory distress. She has no wheezes. She has no rales.  Abdominal: Soft. She exhibits no distension. There is no tenderness. There is no guarding.  Musculoskeletal: She exhibits no edema or tenderness.  Neurological: She is alert and oriented to person, place, and time.  Skin:  Skin is warm and dry. No rash noted. She is not diaphoretic. No erythema.  Nursing note and vitals reviewed.    ED Treatments / Results  Labs (all labs ordered are listed, but only abnormal results are displayed) Labs Reviewed  COMPREHENSIVE METABOLIC PANEL - Abnormal; Notable for the following components:      Result Value   Chloride 97 (*)    Glucose, Bld 143 (*)    Creatinine, Ser 1.13 (*)    Total Protein 9.0 (*)    GFR calc non Af Amer 53 (*)  All other components within normal limits  CBC - Abnormal; Notable for the following components:   RBC 5.31 (*)    Hemoglobin 17.1 (*)    HCT 49.2 (*)    All other components within normal limits  URINALYSIS, ROUTINE W REFLEX MICROSCOPIC - Abnormal; Notable for the following components:   Color, Urine AMBER (*)    APPearance HAZY (*)    Glucose, UA >=500 (*)    Ketones, ur 20 (*)    Protein, ur 100 (*)    Bacteria, UA MANY (*)    All other components within normal limits  LIPASE, BLOOD  TROPONIN I    EKG  EKG Interpretation  Date/Time:  Wednesday March 26 2017 14:40:14 EST Ventricular Rate:  87 PR Interval:    QRS Duration: 92 QT Interval:  425 QTC Calculation: 512 R Axis:   69 Text Interpretation:  Sinus rhythm Nonspecific T abnrm, anterolateral leads Prolonged QT interval TW changes latera leads new from prior Confirmed by Gareth Morgan [phone]) on 03/26/2017 3:16:34 PM       Radiology No results found.  Procedures Procedures (including critical care time)  Medications Ordered in ED Medications  sodium chloride 0.9 % bolus 1,000 mL (0 mLs Intravenous Stopped 03/26/17 1444)  haloperidol lactate (HALDOL) injection 3 mg (3 mg Intravenous Given 03/26/17 1448)     Initial Impression / Assessment and Plan / ED Course  I have reviewed the triage vital signs and the nursing notes.  Pertinent labs & imaging results that were available during my care of the patient were reviewed by me and considered in my medical  decision making (see chart for details).     57 year old female with history of diabetes, depression, hyperlipidemia, history of bladder tumor with TURBT in 2017, cocaine abuse, presents with concern for nausea and vomiting.  Patient denies hematemesis however noted "dark stuff' in triage.  After 2 days of symptoms, her hgb is 17, she denies black or bloody stools (or any stools in last 2 days) and have low suspicion for clinically significant GI bleed.  EKG shows nonspecific changes in lateral leads. Troponin negative after 2 days of symptoms, and patient denies any dyspnea or chest pain and doubt ACS.   Labs without acute findings, Cr improved from prior. Hgb likely indicates some dehydration. Exam benign, soft, nontender, doubt SBO, appendicitis, cholecystitis, diverticulitis. No sign of DKA on labwork. Small ketones in urine without significant hyperglycemia, and no sign of acidosis.  Emesis likely secondary to marijuana use, possible cocaine use, possible viral infection. No headache.  Given IV fluids and haldol. Tolerating po in the ED, feels improved. Stable for discharge.  Patient hypertensive in the emergency department, with no prior history of hypertension.  She given prescription for amlodipine and recommend follow-up with her primary care physician. Counseled in cessation of drugs.   Final Clinical Impressions(s) / ED Diagnoses   Final diagnoses:  Non-intractable vomiting with nausea, unspecified vomiting type    ED Discharge Orders    None           Gareth Morgan, MD 03/26/17 1637

## 2017-05-16 ENCOUNTER — Encounter (HOSPITAL_COMMUNITY): Payer: Self-pay | Admitting: Psychiatry

## 2017-05-16 ENCOUNTER — Ambulatory Visit (INDEPENDENT_AMBULATORY_CARE_PROVIDER_SITE_OTHER): Payer: Medicare Other | Admitting: Psychiatry

## 2017-05-16 VITALS — BP 142/88 | HR 110 | Ht 67.0 in | Wt 168.0 lb

## 2017-05-16 DIAGNOSIS — Z813 Family history of other psychoactive substance abuse and dependence: Secondary | ICD-10-CM

## 2017-05-16 DIAGNOSIS — F17210 Nicotine dependence, cigarettes, uncomplicated: Secondary | ICD-10-CM | POA: Diagnosis not present

## 2017-05-16 DIAGNOSIS — Z811 Family history of alcohol abuse and dependence: Secondary | ICD-10-CM

## 2017-05-16 DIAGNOSIS — Z6379 Other stressful life events affecting family and household: Secondary | ICD-10-CM

## 2017-05-16 DIAGNOSIS — F339 Major depressive disorder, recurrent, unspecified: Secondary | ICD-10-CM

## 2017-05-16 MED ORDER — GABAPENTIN 300 MG PO CAPS: ORAL_CAPSULE | ORAL | 2 refills | Status: DC

## 2017-05-16 MED ORDER — GABAPENTIN 300 MG PO CAPS: ORAL_CAPSULE | ORAL | 1 refills | Status: DC
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
## 2017-07-04 ENCOUNTER — Ambulatory Visit (HOSPITAL_COMMUNITY): Payer: Medicare Other | Admitting: Psychiatry

## 2017-07-21 ENCOUNTER — Encounter: Payer: Self-pay | Admitting: Internal Medicine

## 2017-08-13 IMAGING — US US RENAL
1 series · 14 of 25 positions shown · non-contrast
Comparison: None.

CLINICAL DATA: Hematuria for 3 days. Left lower quadrant pain for 1
day.

EXAM:
RENAL / URINARY TRACT ULTRASOUND COMPLETE

[Series 1: us renal · 0.26mm/px · 14 of 36 slices shown]
[im 1/36]
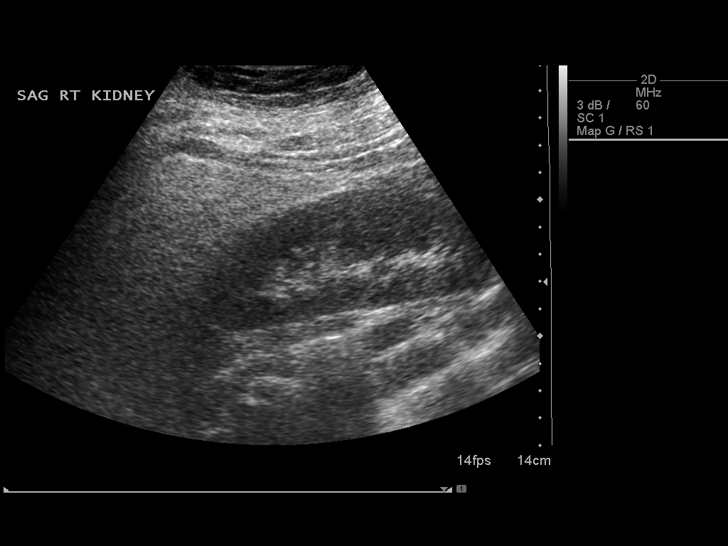
[im 3/36]
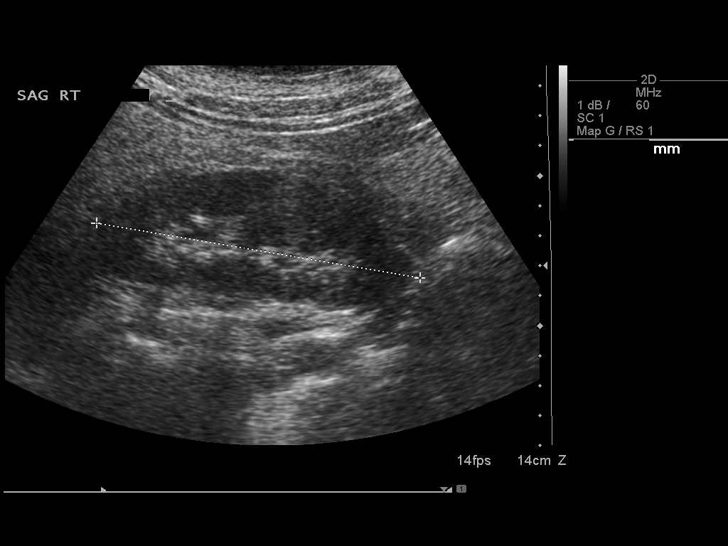
[im 6/36]
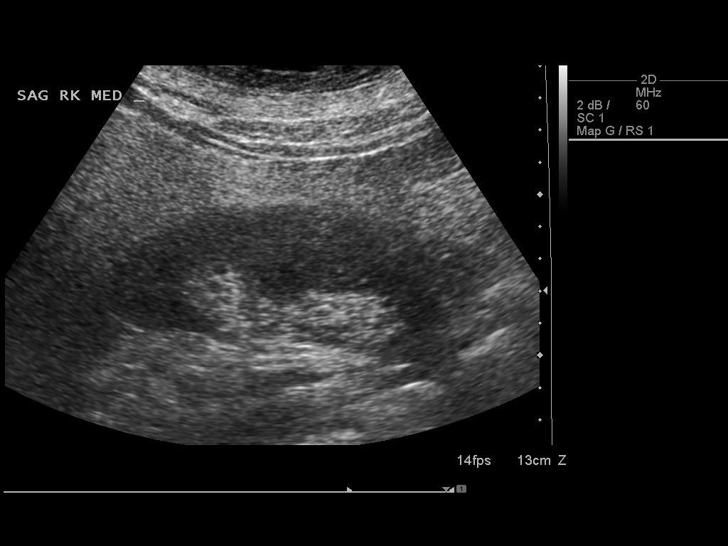
[im 9/36]
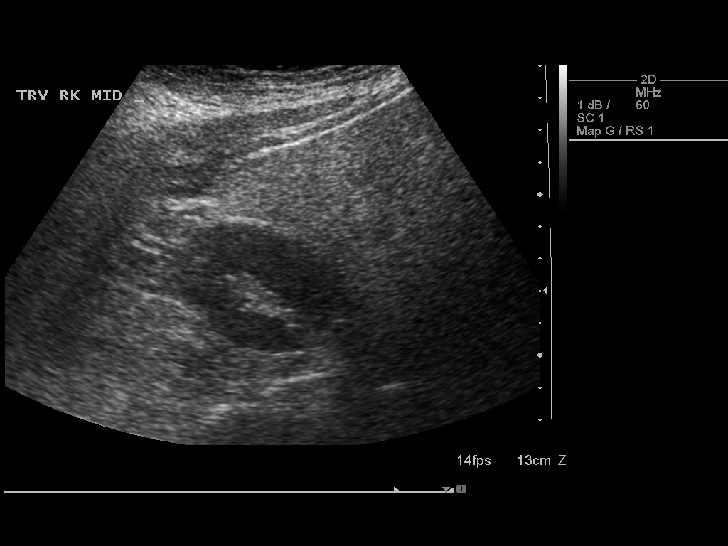
[im 12/36]
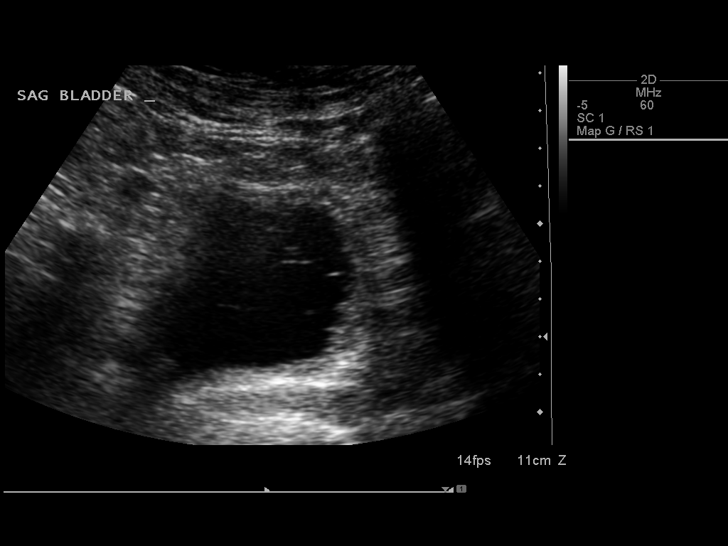
[im 14/36]
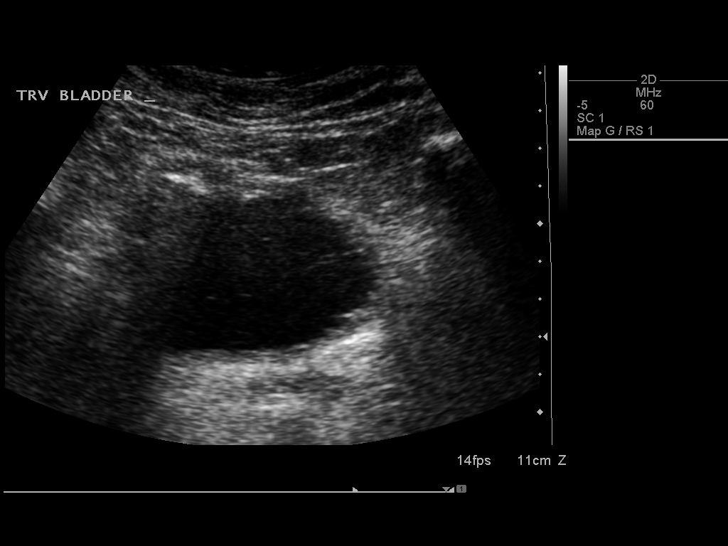
[im 17/36]
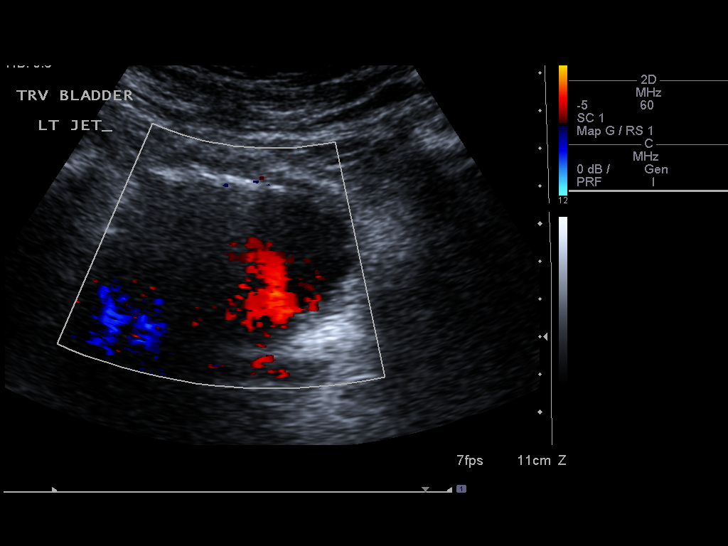
[im 19/36]
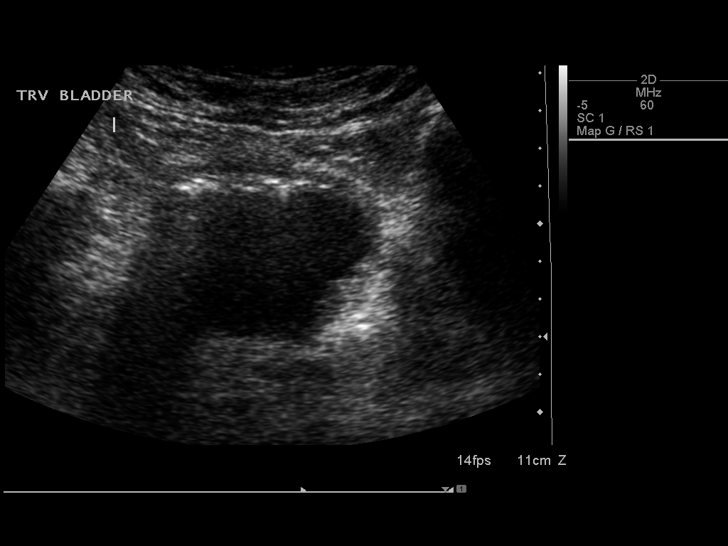
[im 22/36]
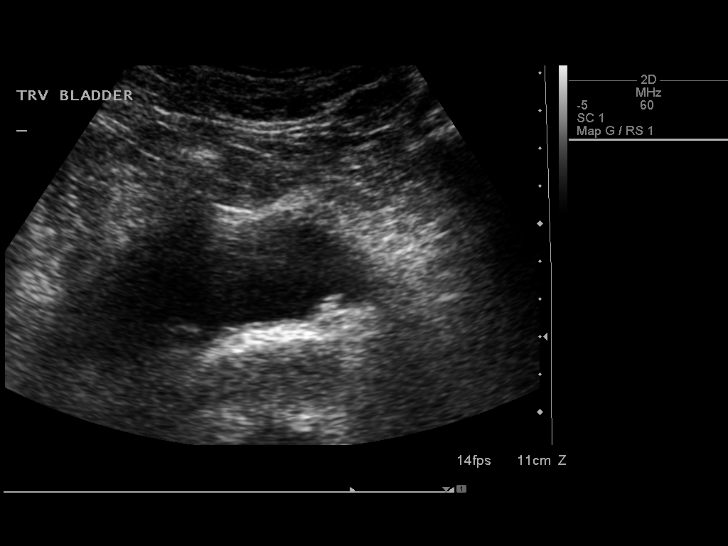
[im 24/36]
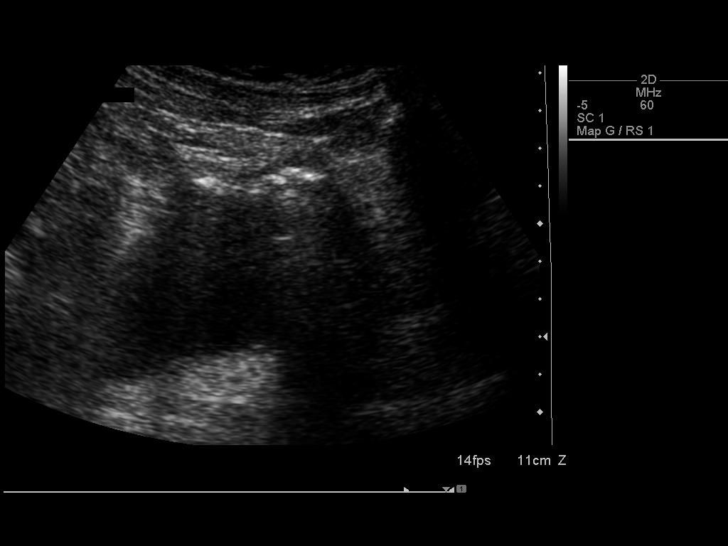
[im 27/36]
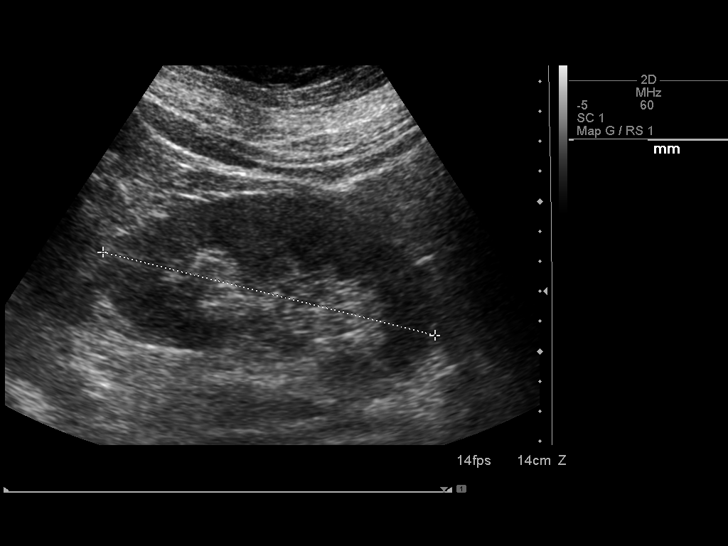
[im 30/36]
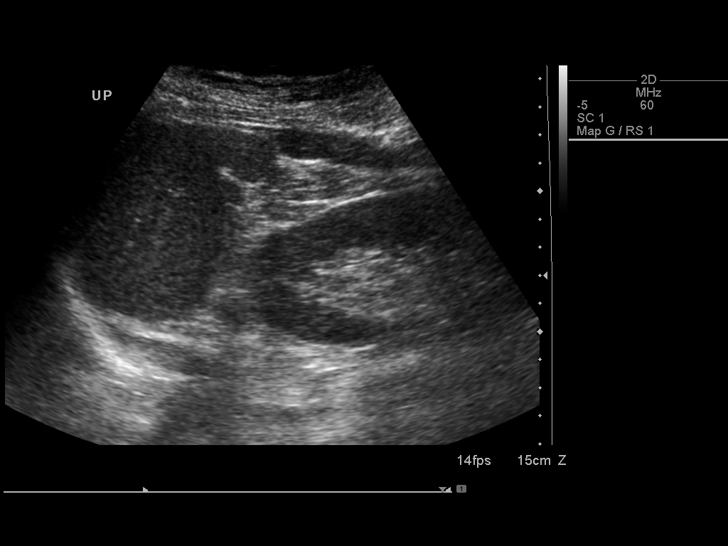
[im 33/36]
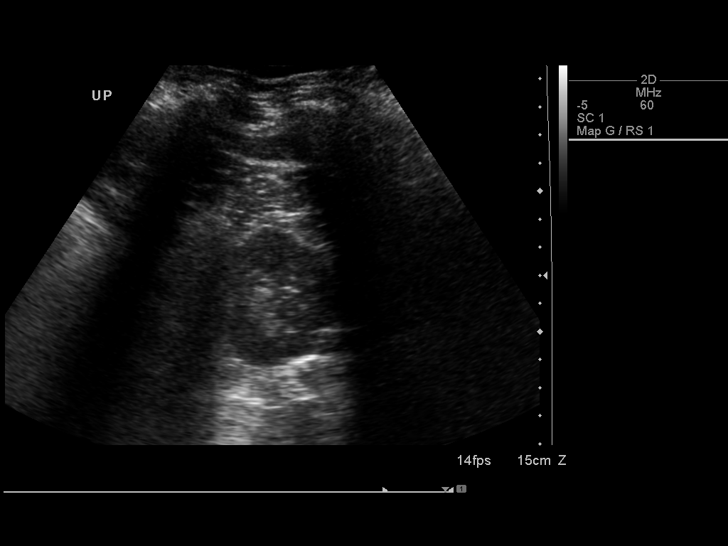
[im 36/36]
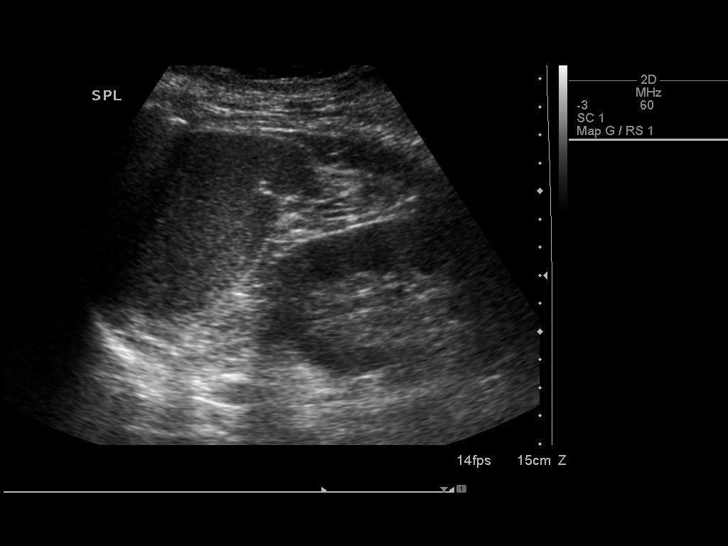

[14 of 25 positions shown; findings below may reference images not displayed]

FINDINGS: Right Kidney:

Length: 11.1 cm. Echogenicity within normal limits. No mass or
hydronephrosis visualized.

Left Kidney:

Length: 11.4 cm. Echogenicity within normal limits. No mass or
hydronephrosis visualized.

Bladder:

Floating echogenic material within the bladder lumen. Increased
echogenicity and shadowing from the anterior bladder wall suggesting
bladder wall calcifications. Bladder wall appears mildly irregular.
IMPRESSION: No hydronephrosis or focal renal abnormality.

Bladder wall irregularity with possible anterior bladder wall
calcifications. This should be further evaluated with CT or
cystoscopy.

Floating debris within the bladder lumen.

## 2017-09-05 ENCOUNTER — Ambulatory Visit (HOSPITAL_COMMUNITY): Payer: Medicare Other | Admitting: Psychiatry

## 2017-10-14 ENCOUNTER — Ambulatory Visit (INDEPENDENT_AMBULATORY_CARE_PROVIDER_SITE_OTHER): Payer: Medicare Other | Admitting: Family Medicine

## 2017-10-14 ENCOUNTER — Other Ambulatory Visit: Payer: Self-pay

## 2017-10-14 ENCOUNTER — Other Ambulatory Visit: Payer: Self-pay | Admitting: Family Medicine

## 2017-10-14 ENCOUNTER — Encounter: Payer: Self-pay | Admitting: Family Medicine

## 2017-10-14 VITALS — BP 102/60 | HR 82 | Temp 98.1°F | Ht 68.0 in | Wt 185.0 lb

## 2017-10-14 DIAGNOSIS — E039 Hypothyroidism, unspecified: Secondary | ICD-10-CM | POA: Diagnosis not present

## 2017-10-14 DIAGNOSIS — E119 Type 2 diabetes mellitus without complications: Secondary | ICD-10-CM | POA: Diagnosis not present

## 2017-10-14 DIAGNOSIS — E785 Hyperlipidemia, unspecified: Secondary | ICD-10-CM

## 2017-10-14 DIAGNOSIS — F333 Major depressive disorder, recurrent, severe with psychotic symptoms: Secondary | ICD-10-CM

## 2017-10-14 LAB — POCT GLYCOSYLATED HEMOGLOBIN (HGB A1C): HBA1C, POC (CONTROLLED DIABETIC RANGE): 6 % (ref 0.0–7.0)

## 2017-10-14 MED ORDER — CANAGLIFLOZIN 100 MG PO TABS: 100.0000 mg | ORAL_TABLET | Freq: Every day | ORAL | 2 refills | Status: DC

## 2017-10-14 MED ORDER — METFORMIN HCL 500 MG PO TABS
500.0000 mg | ORAL_TABLET | Freq: Two times a day (BID) | ORAL | 2 refills | Status: DC
Start: 2017-10-14 — End: 2020-02-23

## 2017-10-14 MED ORDER — BIMATOPROST 0.01 % OP SOLN: 1.0000 [drp] | Freq: Every day | OPHTHALMIC | 0 refills | Status: AC

## 2017-10-14 MED ORDER — SIMVASTATIN 40 MG PO TABS: 40.0000 mg | ORAL_TABLET | Freq: Every day | ORAL | 3 refills | Status: DC

## 2017-10-14 NOTE — Progress Notes (Signed)
Subjective: Chief Complaint  Patient presents with  . New Patient (Initial Visit)    HPI: Raven [name] is a 57 y.o. presenting to clinic today to discuss the following:  Establish Care/Social Barriers to Care/Substance Abuse Patient does not have a PCP is looking to establish care here today to get "refills of her medications". She is unable to tell me what she takes. She admits to being overwhelmed, drinking more alcohol "than ever before" on a daily basis, smoking marijuana, and using cocaine on a regular basis. She is afraid of going to the ED as she states "they will take my child away".   Depression/Anxiety Patient is seen and managed by Dr. Casimiro Needle. She has not been seen since March of this year and admits to increased substance abuse. GAD-7 today in office was 20 and PHQ-9 is 25 with the last question as "very difficult". She denies any history of suicide attempts, suicidal ideation or plan at this time, and states "I want to live to help my son". She has been off her mediations for several months but cannot tell me what she was taking. Advised patient to go to the ED and get care but she declines stating "they will take my son and put him in foster care". She states she is open to getting outpatient treatment. She declines inpatient treatment again due to concern for her son and having no one to care for him. I had patient call her Psychiatrist office and make an appointment today. She has an appointment for October. She also states that the nurse is calling in her prescriptions and she will pick them up. She agrees to call me if she has any issues in getting her medications for her depression and anxiety.  Diabetes Well controlled at today's visit with A1c of 6.0. She states she is out of all her medications and needs refills. She is having difficulty affording her medications. Advised patient since she has medicaid to still go to the pharmacy and get what she can afford today.     Health Maintenance: none     ROS noted in HPI.   Past Medical, Surgical, Social, and Family History Reviewed & Updated per EMR.   Pertinent Historical Findings include:   Social History   Tobacco Use  Smoking Status Current Every Day Smoker  . Packs/day: 0.25  . Years: 13.00  . Pack years: 3.25  . Types: Cigarettes  Smokeless Tobacco Never Used    Objective: BP 102/60   Pulse 82   Temp 98.1 F (36.7 C) (Oral)   Ht 5\' 8"  (1.727 m)   Wt 185 lb (83.9 kg)   LMP  (LMP Unknown)   SpO2 97%   BMI 28.13 kg/m  Vitals and nursing notes reviewed  Physical Exam Gen: Mild distress HEENT: Normocephalic, atraumatic, PERRLA, EOMI, TM visible with good light reflex, non-swollen, non-erythematous turbinates, non-erythematous pharyngeal mucosa, no exudates Neck: trachea midline, no thyroidmegaly, no LAD CV: RRR, no murmurs, normal S1, S2 split Resp: CTAB, no wheezing, rales, or rhonchi, comfortable work of breathing Abd: non-distended, non-tender, soft, +bs in all four quadrants MSK: Moves all four extremities Ext: no clubbing, cyanosis, or edema Neuro: No gross deficits Skin: warm, dry, intact, no rashes Psych: demonstrates ability to make decisions with insight, denies suicidal ideation or plan but is clearly anxious and in some distress and admits to feelings of depression  No results found for this or any previous visit (from the past 72 hour(s)).  Assessment/Plan:  Severe recurrent major depressive disorder with psychotic features No psychotic features, no suicidal ideation or plan at visit today. Patient exhibits insight and decision making capacity and was agreeable to seeking outpatient care. She did reach out to psychiatrist office today to ensure she got access to her medications. I did not refill any on her list b/c she could not verify what she had most recently been taking. I did review her last psych note and agree with Dr. Casimiro Needle that she needs inpatient  treatment, however she is not agreeable at this time due to lacking someone to care for her son. I gave her resources to get in contact for outpatient care ([place]) for her substance abuse.  She will call me back if she does not get her psych medications refilled by her Psychiatrist.   2 week follow up to ensure she is on medications and she is seeking outpatient substance abuse care.  Hypothyroidism Checking TSH today before I am refilling her Levothyroxine. She could not tell me the last time she took it over how much she was taking.  Diabetes mellitus, type 2 Continue metformin and invokana. Well controlled at this time.  Hyperlipidemia Refilled simvastatin   PATIENT EDUCATION PROVIDED: See AVS    Diagnosis and plan along with any newly prescribed medication(s) were discussed in detail with this patient today. The patient verbalized understanding and agreed with the plan. Patient advised if symptoms worsen return to clinic or ER.   Health Maintainance:   Orders Placed This Encounter  Procedures  . TSH  . HgB A1c    Meds ordered this encounter  Medications  . metFORMIN (GLUCOPHAGE) 500 MG tablet    Sig: Take 1 tablet (500 mg total) by mouth 2 (two) times daily with a meal.    Dispense:  60 tablet    Refill:  2  . canagliflozin (INVOKANA) 100 MG TABS tablet    Sig: Take 1 tablet (100 mg total) by mouth daily before breakfast.    Dispense:  30 tablet    Refill:  2  . bimatoprost (LUMIGAN) 0.01 % SOLN    Sig: Place 1 drop into both eyes at bedtime.    Dispense:  5 mL    Refill:  0  . simvastatin (ZOCOR) 40 MG tablet    Sig: Take 1 tablet (40 mg total) by mouth daily.    Dispense:  30 tablet    Refill:  [place], DO 10/14/2017, 9:38 AM PGY-2 [place]

## 2017-10-14 NOTE — Patient Instructions (Signed)
It was great to meet you today! Thank you for letting me participate in your care!  Today, we discussed your medication refills. I have sent your diabetes medications and your cholesterol medications to your pharmacy.   It is very important that you contact Dr. Casimiro Needle today and make sure you get an appointment to be seen by him so that he can properly manage your medications. Please call me once you have your medications for your depression and anxiety.   If you have suicidal thoughts please seek care immediately.  Be well, Harolyn Rutherford, DO PGY-2, [facility]

## 2017-10-15 ENCOUNTER — Ambulatory Visit (HOSPITAL_COMMUNITY): Payer: Self-pay | Admitting: Psychiatry

## 2017-10-17 ENCOUNTER — Encounter (HOSPITAL_COMMUNITY): Payer: Self-pay | Admitting: Psychology

## 2017-10-17 ENCOUNTER — Encounter (HOSPITAL_COMMUNITY): Payer: Self-pay | Admitting: Psychiatry

## 2017-10-17 ENCOUNTER — Ambulatory Visit (INDEPENDENT_AMBULATORY_CARE_PROVIDER_SITE_OTHER): Payer: Medicare Other | Admitting: Psychiatry

## 2017-10-17 VITALS — BP 128/74 | HR 80 | Ht 68.0 in | Wt 187.0 lb

## 2017-10-17 DIAGNOSIS — F323 Major depressive disorder, single episode, severe with psychotic features: Secondary | ICD-10-CM

## 2017-10-17 MED ORDER — ARIPIPRAZOLE 10 MG PO TABS: ORAL_TABLET | ORAL | 2 refills | Status: DC

## 2017-10-17 MED ORDER — GABAPENTIN 300 MG PO CAPS: ORAL_CAPSULE | ORAL | 1 refills | Status: DC

## 2017-10-17 MED ORDER — TRAZODONE HCL 100 MG PO TABS: 100.0000 mg | ORAL_TABLET | Freq: Every day | ORAL | 4 refills | Status: DC

## 2017-10-17 MED ORDER — SUVOREXANT 20 MG PO TABS: 20.0000 mg/d | ORAL_TABLET | Freq: Every evening | ORAL | 3 refills | Status: DC

## 2017-10-17 MED ORDER — ZOLPIDEM TARTRATE ER 12.5 MG PO TBCR: 12.5000 mg | EXTENDED_RELEASE_TABLET | Freq: Every evening | ORAL | 1 refills | Status: DC | PRN

## 2017-10-17 NOTE — Progress Notes (Signed)
Raven [name] is a 57 y.o. female patient. Counselor met briefly with patient to discuss possible entry into the CD-IOP. Her psychiatrist, Dr. Casimiro Needle, had spoken briefly with counselor about her need for drug counseling. He had met her this morning and was refilling her scripts, but she had not been to see him in months. She admitted she had been on a crack run since February of this year. The patient reported she had not used in three days. She goes to the [employer] at 7:30 a few blocks away and walked from there to her appt with Dr. Casimiro Needle. Patient has pain with hip and knee, but I informed her she would not be allowed to take the Tramadol if she was going to attend the CD-IOP. The patient wondered what she would do for the pain? I assured her I would speak with our program director, but no sort of narcotic pain med would be allowed. She stated she would continue to do what she knows in order to remain clean. The patient noted she had 15 years of sobriety before she relapsed in February. I agreed to contact her about the pain, but is seems unlikely we will be able to provide her something for the pain that will rival the Tramadol.         Brandon Melnick, LCAS

## 2017-10-17 NOTE — Progress Notes (Signed)
Patient ID: Raven [name], female   DOB: 1960-12-06, 57 y.o.   MRN: [id number] Patient ID: Raven [name], female   DOB: 01-May-1960, 57 y.o.   MRN: [id number] [facility] MD Progress Note  10/17/2017 9:19 AM Raven [name]  MRN:  [id number] Subjective:  Doing well Principal Problem: Major depression with psychotic fea This patient has not been seen in 5 months. She is a very chaotic life. She is a 98 year old son is using cocaine and marijuana. The patient herself marijuana says she's been sober and clean for 3 days. This morning she went to AA. The patient denies being depressed. She actually appears somewhat more delayed that she's going to try to get sober. We talked about the possibility of removing 2 as needed is dangerous. The source of her cocaine was her boyfriend was living with her. She claims that she's got in the last week. The boyfriend did not get along well with her son. In essence this is the setting of extensive cocaine use marijuana use involving her 57 year old son and a boyfriend who she acknowledges was only there were sexual relationship and cocaine. The patient says she sleeping very poorly. She says although language implying that she wants to get sober and clean. She's actually lost 25 pounds she said to try to get down her weight where she did have surgery on her hip. The patient has Medicaid and Medicare. At one point she was employed but not at this time. She denies being suicidal or homicidal. She is functioning quite poorly. It is noted that she has 3 other sons were in the [employer]. Past Medical History:  Past Medical History:  Diagnosis Date  . Anxiety   . Arthritis    all joints and thumbs  . Bladder tumor   . Glaucoma of both eyes   . 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 Hyperlipidemia   . OSA (obstructive sleep apnea)    per pt study 2008  no cpap  . Post-surgical hypothyroidism   . Severe recurrent major depression with psychotic  [facility])    followed by psychologist--  dr Norma Fredrickson  . Type 2 diabetes mellitus ([place])   . Wears dentures    upper  . Wears glasses     Past Surgical History:  Procedure Laterality Date  . COLONOSCOPY  Nov 2016  . STRABISMUS SURGERY Bilateral age 13  . THYROIDECTOMY  2004  . TRANSURETHRAL RESECTION OF BLADDER TUMOR N/A 04/28/2015   Procedure: TRANSURETHRAL RESECTION OF BLADDER TUMOR (TURBT) ;  Surgeon: Franchot Gallo, MD;  Location: [facility];  Service: Urology;  Laterality: N/A;   Family History:  Family History  Problem Relation Age of Onset  . Diabetes Mother   . Stroke Mother   . Heart disease Mother   . Cancer Sister   . Alcohol abuse Father   . Colon cancer Father   . Alcohol abuse Sister   . Alcohol abuse Brother   . Drug abuse Brother   . Alcohol abuse Brother   . Drug abuse Brother    Family Psychiatric  History:  Social History:  Social History   Substance and Sexual Activity  Alcohol Use Yes  . Alcohol/week: 3.0 standard drinks  . Types: 3 Glasses of wine per week   Comment: wine     Social History   Substance and Sexual Activity  Drug Use No  . Types: "Crack" cocaine  Comment: hx drug use --  in remission since 2003    Social History   Socioeconomic History  . Marital status: Single    Spouse name: Not on file  . Number of children: Not on file  . Years of education: Not on file  . Highest education level: Not on file  Occupational History  . Not on file  Social Needs  . Financial resource strain: Not on file  . Food insecurity:    Worry: Not on file    Inability: Not on file  . Transportation needs:    Medical: Not on file    Non-medical: Not on file  Tobacco Use  . Smoking status: Current Every Day Smoker    Packs/day: 0.25    Years: 13.00    Pack years: 3.25    Types: Cigarettes  . Smokeless tobacco: Never Used  Substance and Sexual Activity  . Alcohol use: Yes    Alcohol/week: 3.0 standard drinks     Types: 3 Glasses of wine per week    Comment: wine  . Drug use: No    Types: "Crack" cocaine    Comment: hx drug use --  in remission since 2003  . Sexual activity: Never  Lifestyle  . Physical activity:    Days per week: Not on file    Minutes per session: Not on file  . Stress: Not on file  Relationships  . Social connections:    Talks on phone: Not on file    Gets together: Not on file    Attends religious service: Not on file    Active member of club or organization: Not on file    Attends meetings of clubs or organizations: Not on file    Relationship status: Not on file  Other Topics Concern  . Not on file  Social History Narrative  . Not on file   Additional Social History:                         Sleep: Fair  Appetite:  Fair  Current Medications: Current Outpatient Medications  Medication Sig Dispense Refill  . amLODipine (NORVASC) 5 MG tablet Take 1 tablet (5 mg total) by mouth daily. 30 tablet 0  . bimatoprost (LUMIGAN) 0.01 % SOLN Place 1 drop into both eyes at bedtime. 5 mL 0  . canagliflozin (INVOKANA) 100 MG TABS tablet Take 1 tablet (100 mg total) by mouth daily before breakfast. 30 tablet 2  . gabapentin (NEURONTIN) 300 MG capsule 1  qam    2  qhs 90 capsule 2  . gabapentin (NEURONTIN) 300 MG capsule 1  qam   2 qhs 90 capsule 1  . hydrOXYzine (VISTARIL) 25 MG capsule 1   Tid   2  prn 150 capsule 3  . levothyroxine (SYNTHROID, LEVOTHROID) 175 MCG tablet Take 1 tablet by mouth every evening.  0  . metFORMIN (GLUCOPHAGE) 500 MG tablet Take 1 tablet (500 mg total) by mouth 2 (two) times daily with a meal. 60 tablet 2  . omeprazole (PRILOSEC) 20 MG capsule Take 1 capsule (20 mg total) by mouth daily. 30 capsule 3  . simvastatin (ZOCOR) 40 MG tablet Take 1 tablet (40 mg total) by mouth daily. 30 tablet 3  . traMADol (ULTRAM) 50 MG tablet Take 1 tablet (50 mg total) by mouth every 6 (six) hours as needed. 20 tablet 0  . traZODone (DESYREL) 100 MG  tablet Take 100 mg by mouth  daily.    . ARIPiprazole (ABILIFY) 10 MG tablet 1  qhs 30 tablet 2  . zolpidem (AMBIEN CR) 12.5 MG CR tablet Take 1 tablet (12.5 mg total) by mouth at bedtime as needed for sleep. 30 tablet 1   No current facility-administered medications for this visit.    Facility-Administered Medications Ordered in Other Visits  Medication Dose Route Frequency Provider Last Rate Last Dose  . epirubicin (ELLENCE) 50 mg in sodium chloride 0.9 % bladder instillation  50 mg Bladder Instillation Once Franchot Gallo, MD        Lab Results: No results found for this or any previous visit (from the past 48 hour(s)).  Physical Findings: AIMS:  , ,  ,  ,    CIWA:    COWS:     Musculoskeletal: Strength & Muscle Tone: within normal limits Gait & Station: normal Patient leans: N/A  Psychiatric Specialty Exam: ROS  Blood pressure 128/74, pulse 80, height 5\' 8"  (1.727 m), weight 187 lb (84.8 kg).Body mass index is 28.43 kg/m.  General Appearance: Casual  Eye Contact::  Good  Speech:  Clear and Coherent  Volume:  Normal  Mood:  NA  Affect:  Congruent  Thought Process:  Coherent  Orientation:  Full (Time, Place, and Person)  Thought Content:  WDL  Suicidal Thoughts:  No  Homicidal Thoughts:  No  Memory:  NA  Judgement:  Good  Insight:  Good  Psychomotor Activity:  Normal  Concentration:  Good  Recall:  Good  Fund of Knowledge:Good  Language: Good  Akathisia:  No  Handed:  Right  AIMS (if indicated):     Assets:  Desire for Improvement  ADL's:  Intact  Cognition: WNL  Sleep:      Treatment Plan Summary: At this time her #1 problem is obviously substance abuse. She is cocaine and marijuana dependent. When I saw her 5 months ago she again was low dose of Neurontin mainly for anxiety and perhaps to stopcraving. I doubt very much. Nonetheless and going to restart her back where she started with Neurontin 300 mg 1 in the morning and 2 at night. Her second problem is  I suspect a schizoaffective disorder which may also be related to substance abuse. The patient admits to auditory hallucinations on a regular basis and of course is not clear how much this is related to her substances as opposed to an underlying psychotic disorder. Nonetheless in the past she Abilify and will go ahead and restart 10 mg at night. I will give her a dose of Ambien to help her sleep. Her third problem therefore is insomnia. Today the patient denies being suicidal. Our plan will be to get her involved with substance abuse IOP program is sensitive. Today she'll be evaluated for this program and eventually they'll be discussions about getting her more financial support to that perhaps to a different or safer environment perhaps with less substance availability. Verbally the patient claims that she is ready once today. I will take this opportunity to get her to a program to restart her medicines and asked her to return to see me just 5 weeks.

## 2017-10-17 NOTE — Addendum Note (Signed)
Addended by: Norma Fredrickson on: 10/17/2017 12:10 PM   Modules accepted: Orders

## 2017-10-18 DIAGNOSIS — E785 Hyperlipidemia, unspecified: Secondary | ICD-10-CM | POA: Insufficient documentation

## 2017-10-18 NOTE — Assessment & Plan Note (Signed)
Refilled simvastatin.

## 2017-10-18 NOTE — Assessment & Plan Note (Signed)
Continue metformin and invokana. Well controlled at this time.

## 2017-10-18 NOTE — Assessment & Plan Note (Signed)
No psychotic features, no suicidal ideation or plan at visit today. Patient exhibits insight and decision making capacity and was agreeable to seeking outpatient care. She did reach out to psychiatrist office today to ensure she got access to her medications. I did not refill any on her list b/c she could not verify what she had most recently been taking. I did review her last psych note and agree with Dr. Casimiro Needle that she needs inpatient treatment, however she is not agreeable at this time due to lacking someone to care for her son. I gave her resources to get in contact for outpatient care ([facility]) for her substance abuse.  She will call me back if she does not get her psych medications refilled by her Psychiatrist.   2 week follow up to ensure she is on medications and she is seeking outpatient substance abuse care.

## 2017-10-18 NOTE — Assessment & Plan Note (Signed)
Checking TSH today before I am refilling her Levothyroxine. She could not tell me the last time she took it over how much she was taking.

## 2017-10-24 ENCOUNTER — Other Ambulatory Visit (HOSPITAL_COMMUNITY): Payer: Self-pay

## 2017-11-21 ENCOUNTER — Ambulatory Visit (HOSPITAL_COMMUNITY): Payer: Self-pay | Admitting: Psychiatry

## 2017-12-05 ENCOUNTER — Encounter

## 2017-12-05 ENCOUNTER — Ambulatory Visit (HOSPITAL_COMMUNITY): Payer: Self-pay | Admitting: Psychiatry

## 2017-12-18 ENCOUNTER — Ambulatory Visit: Payer: Self-pay | Admitting: Family Medicine

## 2018-04-06 ENCOUNTER — Other Ambulatory Visit: Payer: Self-pay

## 2018-04-06 ENCOUNTER — Emergency Department (HOSPITAL_COMMUNITY)
Admission: EM | Admit: 2018-04-06 | Discharge: 2018-04-06 | Disposition: A | Discharge status: Home / Self Care | Payer: Medicare Other | Attending: Emergency Medicine | Admitting: Emergency Medicine

## 2018-04-06 ENCOUNTER — Emergency Department (HOSPITAL_COMMUNITY): Payer: Medicare Other

## 2018-04-06 ENCOUNTER — Encounter (HOSPITAL_COMMUNITY): Payer: Self-pay | Admitting: Emergency Medicine

## 2018-04-06 DIAGNOSIS — E890 Postprocedural hypothyroidism: Secondary | ICD-10-CM | POA: Diagnosis not present

## 2018-04-06 DIAGNOSIS — E119 Type 2 diabetes mellitus without complications: Secondary | ICD-10-CM | POA: Insufficient documentation

## 2018-04-06 DIAGNOSIS — R1110 Vomiting, unspecified: Secondary | ICD-10-CM | POA: Diagnosis present

## 2018-04-06 DIAGNOSIS — L02511 Cutaneous abscess of right hand: Secondary | ICD-10-CM | POA: Insufficient documentation

## 2018-04-06 DIAGNOSIS — Z7984 Long term (current) use of oral hypoglycemic drugs: Secondary | ICD-10-CM | POA: Insufficient documentation

## 2018-04-06 DIAGNOSIS — R112 Nausea with vomiting, unspecified: Secondary | ICD-10-CM | POA: Insufficient documentation

## 2018-04-06 DIAGNOSIS — Z79899 Other long term (current) drug therapy: Secondary | ICD-10-CM | POA: Diagnosis not present

## 2018-04-06 DIAGNOSIS — F17210 Nicotine dependence, cigarettes, uncomplicated: Secondary | ICD-10-CM | POA: Diagnosis not present

## 2018-04-06 LAB — COMPREHENSIVE METABOLIC PANEL
ALBUMIN: 4.7 g/dL (ref 3.5–5.0)
ALT: 17 U/L (ref 0–44)
AST: 39 U/L (ref 15–41)
Alkaline Phosphatase: 48 U/L (ref 38–126)
Anion gap: 12 (ref 5–15)
BUN: 18 mg/dL (ref 6–20)
CALCIUM: 9.5 mg/dL (ref 8.9–10.3)
CHLORIDE: 98 mmol/L (ref 98–111)
CO2: 22 mmol/L (ref 22–32)
Creatinine, Ser: 1.55 mg/dL — ABNORMAL HIGH (ref 0.44–1.00)
GFR calc Af Amer: 43 mL/min — ABNORMAL LOW (ref >60)
GFR calc non Af Amer: 37 mL/min — ABNORMAL LOW (ref >60)
Glucose, Bld: 169 mg/dL — ABNORMAL HIGH (ref 70–99)
POTASSIUM: 3.9 mmol/L (ref 3.5–5.1)
SODIUM: 132 mmol/L — AB (ref 135–145)
TOTAL PROTEIN: 8.2 g/dL — AB (ref 6.5–8.1)
Total Bilirubin: 0.9 mg/dL (ref 0.3–1.2)

## 2018-04-06 LAB — URINALYSIS, ROUTINE W REFLEX MICROSCOPIC
BILIRUBIN URINE: NEGATIVE
Glucose, UA: NEGATIVE mg/dL
HGB URINE DIPSTICK: NEGATIVE
KETONES UR: 5 mg/dL — AB
Leukocytes, UA: NEGATIVE
NITRITE: NEGATIVE
PROTEIN: NEGATIVE mg/dL
Specific Gravity, Urine: 1.01 (ref 1.005–1.030)
pH: 6 (ref 5.0–8.0)

## 2018-04-06 LAB — CBC
HCT: 47.9 % — ABNORMAL HIGH (ref 36.0–46.0)
Hemoglobin: 16.2 g/dL — ABNORMAL HIGH (ref 12.0–15.0)
MCH: 32.7 pg (ref 26.0–34.0)
MCHC: 33.8 g/dL (ref 30.0–36.0)
MCV: 96.6 fL (ref 80.0–100.0)
PLATELETS: 314 10*3/uL (ref 150–400)
RBC: 4.96 MIL/uL (ref 3.87–5.11)
RDW: 13 % (ref 11.5–15.5)
WBC: 7.2 10*3/uL (ref 4.0–10.5)
nRBC: 0 % (ref 0.0–0.2)

## 2018-04-06 LAB — LIPASE, BLOOD: LIPASE: 30 U/L (ref 11–51)

## 2018-04-06 MED ORDER — SODIUM CHLORIDE 0.9% FLUSH
3.0000 mL | Freq: Once | INTRAVENOUS | Status: AC
  Administered 2018-04-06: 3 mL via INTRAVENOUS

## 2018-04-06 MED ORDER — LIDOCAINE HCL (PF) 1 % IJ SOLN
5.0000 mL | Freq: Once | INTRAMUSCULAR | Status: AC
  Administered 2018-04-06: 5 mL via INTRADERMAL
  Filled 2018-04-06: qty 30

## 2018-04-06 MED ORDER — HYDROMORPHONE HCL 1 MG/ML IJ SOLN
1.0000 mg | Freq: Once | INTRAMUSCULAR | Status: AC
  Administered 2018-04-06: 1 mg via INTRAVENOUS
  Filled 2018-04-06: qty 1

## 2018-04-06 MED ORDER — DOXYCYCLINE HYCLATE 100 MG PO CAPS: 100.0000 mg | ORAL_CAPSULE | Freq: Two times a day (BID) | ORAL | 0 refills | Status: DC

## 2018-04-06 MED ORDER — METOCLOPRAMIDE HCL 5 MG/ML IJ SOLN
10.0000 mg | Freq: Once | INTRAMUSCULAR | Status: AC
  Administered 2018-04-06: 10 mg via INTRAVENOUS
  Filled 2018-04-06: qty 2

## 2018-04-06 MED ORDER — LACTATED RINGERS IV BOLUS
1000.0000 mL | Freq: Once | INTRAVENOUS | Status: AC
  Administered 2018-04-06: 1000 mL via INTRAVENOUS

## 2018-04-06 MED ORDER — DOXYCYCLINE HYCLATE 100 MG PO TABS
100.0000 mg | ORAL_TABLET | Freq: Once | ORAL | Status: AC
  Administered 2018-04-06: 100 mg via ORAL
  Filled 2018-04-06: qty 1

## 2018-04-06 MED ORDER — METOCLOPRAMIDE HCL 10 MG PO TABS: 10.0000 mg | ORAL_TABLET | Freq: Three times a day (TID) | ORAL | 0 refills | Status: DC | PRN

## 2018-04-06 NOTE — ED Triage Notes (Signed)
Pt reports that she been vomiting since Saturday and has abd pains. Reports right thumb been infected for couple weeks. Adds that she is diabetic and hasnt taken medications in couple months.

## 2018-04-06 NOTE — Discharge Instructions (Signed)
If you develop worsening, continued, or recurrent abdominal pain, uncontrolled vomiting, fever, chest or back pain, or any other new/concerning symptoms then return to the ER for evaluation.   Follow up with the hand specialist (Dr. Caralyn Guile) if your swelling/pain in your thumb does not improve. Otherwise see your doctor for a wound check in 2-3 days.

## 2018-04-06 NOTE — ED Provider Notes (Signed)
[facility] DEPT Provider Note   CSN: [id number] Arrival date & time: 04/06/18  0754     History   Chief Complaint Chief Complaint  Patient presents with  . Emesis    HPI Raven [name] is a 58 y.o. female.  HPI  58 year old female presents with vomiting.  Started 2 days ago.  She is also having concomitant upper abdominal pain.  Has had this before but is not sure what the cause was.  Has not had any fevers, chest pain or shortness of breath.  There is no back pain.  No constipation or diarrhea.  No urinary symptoms.  Has tried ginger ale and Pepto-Bismol without relief.  Endorses daily alcohol use as well as marijuana use.  She also states that she has had right thumb swelling.  This has been atraumatic and steady for about 3 weeks.  At one point some white pus came out of it.  She states that the swelling and pain today is improved compared to earlier in the course.  Past Medical History:  Diagnosis Date  . Anxiety   . Arthritis    all joints and thumbs  . Bladder tumor   . Glaucoma of both eyes   . History of drug abuse in remission ([place])    hx crack use--  recovery since 2003  . Hyperlipidemia   . OSA (obstructive sleep apnea)    per pt study 2008  no cpap  . Post-surgical hypothyroidism   . Severe recurrent major depression with psychotic features [facility])    followed by psychologist--  dr Norma Fredrickson  . Type 2 diabetes mellitus ([place])   . Wears dentures    upper  . Wears glasses     Patient Active Problem List   Diagnosis Date Noted  . Hyperlipidemia 10/18/2017  . Severe recurrent major depressive disorder with psychotic features ([place]) 10/13/2014  . Diabetes mellitus, type 2 ([place]) 03/03/2014  . Hypothyroidism 03/03/2014    Past Surgical History:  Procedure Laterality Date  . COLONOSCOPY  Nov 2016  . STRABISMUS SURGERY Bilateral age 48  . THYROIDECTOMY  2004  . TRANSURETHRAL RESECTION OF BLADDER TUMOR N/A 04/28/2015   Procedure:  TRANSURETHRAL RESECTION OF BLADDER TUMOR (TURBT) ;  Surgeon: Franchot Gallo, MD;  Location[facility];  Service: Urology;  Laterality: N/A;     OB History   No obstetric history on file.      Home Medications    Prior to Admission medications   Medication Sig Start Date End Date Taking? Authorizing Provider  amLODipine (NORVASC) 5 MG tablet Take 1 tablet (5 mg total) by mouth daily. Patient not taking: Reported on 04/06/2018 03/26/17   Gareth Morgan, MD  ARIPiprazole (ABILIFY) 10 MG tablet 1  qhs Patient not taking: Reported on 04/06/2018 10/17/17   Norma Fredrickson, MD  bimatoprost (LUMIGAN) 0.01 % SOLN Place 1 drop into both eyes at bedtime. Patient not taking: Reported on 04/06/2018 10/14/17   Nuala Alpha, DO  canagliflozin [facility]) 100 MG TABS tablet Take 1 tablet (100 mg total) by mouth daily before breakfast. Patient not taking: Reported on 04/06/2018 10/14/17   Nuala Alpha, DO  doxycycline (VIBRAMYCIN) 100 MG capsule Take 1 capsule (100 mg total) by mouth 2 (two) times daily. One po bid x 7 days 04/06/18   Sherwood Gambler, MD  gabapentin (NEURONTIN) 300 MG capsule 1  qam   2 qhs Patient not taking: Reported on 04/06/2018 10/17/17   Norma Fredrickson, MD  hydrOXYzine (VISTARIL) 25 MG capsule 1   Tid   2  prn Patient not taking: Reported on 04/06/2018 03/22/15   Norma Fredrickson, MD  metFORMIN (GLUCOPHAGE) 500 MG tablet Take 1 tablet (500 mg total) by mouth 2 (two) times daily with a meal. Patient not taking: Reported on 04/06/2018 10/14/17   Nuala Alpha, DO  metoCLOPramide (REGLAN) 10 MG tablet Take 1 tablet (10 mg total) by mouth every 8 (eight) hours as needed for nausea or vomiting. 04/06/18   Sherwood Gambler, MD  omeprazole (PRILOSEC) 20 MG capsule Take 1 capsule (20 mg total) by mouth daily. Patient not taking: Reported on 04/06/2018 03/31/14   Lance Bosch, NP  simvastatin (ZOCOR) 40 MG tablet Take 1 tablet (40 mg total) by mouth daily. Patient not taking:  Reported on 04/06/2018 10/14/17   Nuala Alpha, DO  Suvorexant (BELSOMRA) 20 MG TABS Take 20 mg/day by mouth Nightly. Patient not taking: Reported on 04/06/2018 10/17/17   Norma Fredrickson, MD  traMADol (ULTRAM) 50 MG tablet Take 1 tablet (50 mg total) by mouth every 6 (six) hours as needed. Patient not taking: Reported on 04/06/2018 04/28/15   Franchot Gallo, MD  traZODone (DESYREL) 100 MG tablet Take 1 tablet (100 mg total) by mouth daily. Patient not taking: Reported on 04/06/2018 10/17/17   Norma Fredrickson, MD    Family History Family History  Problem Relation Age of Onset  . Diabetes Mother   . Stroke Mother   . Heart disease Mother   . Cancer Sister   . Alcohol abuse Father   . Colon cancer Father   . Alcohol abuse Sister   . Alcohol abuse Brother   . Drug abuse Brother   . Alcohol abuse Brother   . Drug abuse Brother     Social History Social History   Tobacco Use  . Smoking status: Current Every Day Smoker    Packs/day: 0.25    Years: 13.00    Pack years: 3.25    Types: Cigarettes  . Smokeless tobacco: Never Used  Substance Use Topics  . Alcohol use: Yes    Alcohol/week: 3.0 standard drinks    Types: 3 Glasses of wine per week    Comment: wine  . Drug use: Yes    Types: "Crack" cocaine, Marijuana     Allergies   Patient has no known allergies.   Review of Systems Review of Systems  Constitutional: Negative for fever.  Respiratory: Negative for shortness of breath.   Cardiovascular: Negative for chest pain.  Gastrointestinal: Positive for abdominal pain, nausea and vomiting. Negative for constipation and diarrhea.  Genitourinary: Negative for dysuria.  Musculoskeletal: Positive for arthralgias and joint swelling. Negative for back pain.  All other systems reviewed and are negative.    Physical Exam Updated Vital Signs BP (!) 155/85   Pulse (!) 51   Temp 97.7 F (36.5 C) (Oral)   Resp 14   Ht 5\' 7"  (1.702 m)   Wt 83.9 kg   LMP  (LMP Unknown)    SpO2 100%   BMI 28.98 kg/m   Physical Exam Vitals signs and nursing note reviewed.  Constitutional:      Appearance: She is well-developed. She is not ill-appearing or diaphoretic.  HENT:     Head: Normocephalic and atraumatic.     Right Ear: External ear normal.     Left Ear: External ear normal.     Nose: Nose normal.  Eyes:     General:  Right eye: No discharge.        Left eye: No discharge.  Cardiovascular:     Rate and Rhythm: Normal rate and regular rhythm.     Heart sounds: Normal heart sounds.  Pulmonary:     Effort: Pulmonary effort is normal.     Breath sounds: Normal breath sounds.  Abdominal:     Palpations: Abdomen is soft.     Tenderness: There is abdominal tenderness in the epigastric area.  Musculoskeletal:     Right hand: She exhibits tenderness and swelling. She exhibits normal range of motion. Normal sensation noted. Normal strength noted.       Hands:  Skin:    General: Skin is warm and dry.  Neurological:     Mental Status: She is alert.  Psychiatric:        Mood and Affect: Mood is not anxious.      ED Treatments / Results  Labs (all labs ordered are listed, but only abnormal results are displayed) Labs Reviewed  COMPREHENSIVE METABOLIC PANEL - Abnormal; Notable for the following components:      Result Value   Sodium 132 (*)    Glucose, Bld 169 (*)    Creatinine, Ser 1.55 (*)    Total Protein 8.2 (*)    GFR calc non Af Amer 37 (*)    GFR calc Af Amer 43 (*)    All other components within normal limits  CBC - Abnormal; Notable for the following components:   Hemoglobin 16.2 (*)    HCT 47.9 (*)    All other components within normal limits  URINALYSIS, ROUTINE W REFLEX MICROSCOPIC - Abnormal; Notable for the following components:   APPearance HAZY (*)    Ketones, ur 5 (*)    All other components within normal limits  AEROBIC/ANAEROBIC CULTURE (SURGICAL/DEEP WOUND)  LIPASE, BLOOD    EKG EKG  Interpretation  Date/Time:  Monday April 06 2018 09:09:19 EST Ventricular Rate:  70 PR Interval:    QRS Duration: 81 QT Interval:  424 QTC Calculation: 458 R Axis:   43 Text Interpretation:  Sinus rhythm Borderline prolonged PR interval Nonspecific T abnormalities, lateral leads T wave changes similar to Jan 2019 Confirmed by Sherwood Gambler [phone]) on 04/06/2018 9:20:47 AM   Radiology Dg Abd Acute 2+v W 1v Chest  Result Date: 04/06/2018 CLINICAL DATA:  Vomiting since Saturday with abdominal pain EXAM: DG ABDOMEN ACUTE W/ 1V CHEST COMPARISON:  11/12/2016 abdominal CT. FINDINGS: Normal heart size and mediastinal contours. Thyroidectomy clips. No acute infiltrate or edema. No effusion or pneumothorax. No acute osseous findings. Normal bowel gas pattern. No evident pneumoperitoneum. No concerning mass effect or calcification. IMPRESSION: No acute finding in the chest or abdomen. Electronically Signed   By: Monte Fantasia M.D.   On: 04/06/2018 09:12   Dg Finger Thumb Right  Result Date: 04/06/2018 CLINICAL DATA:  Right thumb infection for a couple of weeks EXAM: RIGHT THUMB 2+V COMPARISON:  None. FINDINGS: Soft tissue swelling over the distal distal dorsal thumb. There is erosion of the tuft as marked on the AP image. No joint narrowing or joint erosions. Advanced first [place] osteoarthritis. IMPRESSION: Soft tissue swelling with irregularity of the tuft concerning for osteomyelitis. Electronically Signed   By: Monte Fantasia M.D.   On: 04/06/2018 09:14    Procedures .Marland KitchenIncision and Drainage Date/Time: 04/06/2018 12:24 PM Performed by: Sherwood Gambler, MD Authorized by: Sherwood Gambler, MD   Consent:    Consent obtained:  Verbal  Consent given by:  Patient Location:    Type:  Abscess   Size:  2 cm   Location:  Upper extremity   Upper extremity location:  Finger   Finger location:  R thumb Pre-procedure details:    Skin preparation:  Betadine Anesthesia (see MAR for exact dosages):     Anesthesia method:  Nerve block   Block location:  Digital   Block needle gauge:  25 G   Block anesthetic:  Lidocaine 1% w/o epi   Block outcome:  Anesthesia achieved Procedure type:    Complexity:  Simple Procedure details:    Incision types:  Single straight   Incision depth:  Dermal   Scalpel blade:  11   Drainage:  Bloody   Drainage amount:  Scant Post-procedure details:    Patient tolerance of procedure:  Tolerated well, no immediate complications   (including critical care time)  Medications Ordered in ED Medications  sodium chloride flush (NS) 0.9 % injection 3 mL (3 mLs Intravenous Given 04/06/18 0912)  HYDROmorphone (DILAUDID) injection 1 mg (1 mg Intravenous Given 04/06/18 0913)  metoCLOPramide (REGLAN) injection 10 mg (10 mg Intravenous Given 04/06/18 0913)  lactated ringers bolus 1,000 mL (0 mLs Intravenous Stopped 04/06/18 1021)  lactated ringers bolus 1,000 mL (0 mLs Intravenous Stopped 04/06/18 1122)  lidocaine (PF) (XYLOCAINE) 1 % injection 5 mL (5 mLs Intradermal Given 04/06/18 1143)  doxycycline (VIBRA-TABS) tablet 100 mg (100 mg Oral Given 04/06/18 1203)     Initial Impression / Assessment and Plan / ED Course  I have reviewed the triage vital signs and the nursing notes.  Pertinent labs & imaging results that were available during my care of the patient were reviewed by me and considered in my medical decision making (see chart for details).     Patient's nausea and vomiting and abdominal pain have resolved after treatment.  Labs show mild AKI at 1.5.  Electrolytes otherwise benign.  Given all of her symptoms resolved I doubt serious acute intra-abdominal process and do not think CT is warranted at this time.  As for her thumb, possible osteomyelitis seen.  This is discussed with orthopedics who saw.  Hand surgery feels like it is probably not osteo-but recommends draining what is likely an abscess.  I made 2 small incisions after anesthesia and only bloody discharge came  out.  I did try to culture it but otherwise will place on antibiotics and they recommend outpatient follow-up.  Appears stable for discharge home with return precautions.  Final Clinical Impressions(s) / ED Diagnoses   Final diagnoses:  Abscess of right thumb  Nausea and vomiting in adult    ED Discharge Orders         Ordered    metoCLOPramide (REGLAN) 10 MG tablet  Every 8 hours PRN     04/06/18 1136    doxycycline (VIBRAMYCIN) 100 MG capsule  2 times daily     04/06/18 1136           Sherwood Gambler, MD 04/06/18 1226

## 2018-04-06 NOTE — Consult Note (Addendum)
Reason for Consult:Right thumb infection Referring Physician: Carren Rang Edmondson is an 58 y.o. female.  HPI: Raven [name] came to the ED with N/V. However, she also has been dealing with a right thumb infection for ~3 weeks. She's been doing H2O2 soaks and squeezing pus out of it at home but it's been persistent. She denies any prior hx/o similar. She has not been to see a provider about it. She is RHD.  Past Medical History:  Diagnosis Date  . Anxiety   . Arthritis    all joints and thumbs  . Bladder tumor   . Glaucoma of both eyes   . History of drug abuse in remission ([place])    hx crack use--  recovery since 2003  . Hyperlipidemia   . OSA (obstructive sleep apnea)    per pt study 2008  no cpap  . Post-surgical hypothyroidism   . Severe recurrent major depression with psychotic features [facility])    followed by psychologist--  dr Norma Fredrickson  . Type 2 diabetes mellitus ([place])   . Wears dentures    upper  . Wears glasses     Past Surgical History:  Procedure Laterality Date  . COLONOSCOPY  Nov 2016  . STRABISMUS SURGERY Bilateral age 6  . THYROIDECTOMY  2004  . TRANSURETHRAL RESECTION OF BLADDER TUMOR N/A 04/28/2015   Procedure: TRANSURETHRAL RESECTION OF BLADDER TUMOR (TURBT) ;  Surgeon: Franchot Gallo, MD;  Location: [facility];  Service: Urology;  Laterality: N/A;    Family History  Problem Relation Age of Onset  . Diabetes Mother   . Stroke Mother   . Heart disease Mother   . Cancer Sister   . Alcohol abuse Father   . Colon cancer Father   . Alcohol abuse Sister   . Alcohol abuse Brother   . Drug abuse Brother   . Alcohol abuse Brother   . Drug abuse Brother     Social History:  reports that she has been smoking cigarettes. She has a 3.25 pack-year smoking history. She has never used smokeless tobacco. She reports current alcohol use of about 3.0 standard drinks of alcohol per week. She reports current drug use. Drugs: "Crack" cocaine and  Marijuana.  Allergies: No Known Allergies  Medications: I have reviewed the patient's current medications.  Results for orders placed or performed during the hospital encounter of 04/06/18 (from the past 48 hour(s))  Lipase, blood     Status: None   Collection Time: 04/06/18  8:17 AM  Result Value Ref Range   Lipase 30 11 - 51 U/L    Comment: Performed at [facility], [place] [address], [place], [place] [address]  Comprehensive metabolic panel     Status: Abnormal   Collection Time: 04/06/18  8:17 AM  Result Value Ref Range   Sodium 132 (L) 135 - 145 mmol/L   Potassium 3.9 3.5 - 5.1 mmol/L   Chloride 98 98 - 111 mmol/L   CO2 22 22 - 32 mmol/L   Glucose, Bld 169 (H) 70 - 99 mg/dL   BUN 18 6 - 20 mg/dL   Creatinine, Ser 1.55 (H) 0.44 - 1.00 mg/dL   Calcium 9.5 8.9 - 10.3 mg/dL   Total Protein 8.2 (H) 6.5 - 8.1 g/dL   Albumin 4.7 3.5 - 5.0 g/dL   AST 39 15 - 41 U/L   ALT 17 0 - 44 U/L   Alkaline Phosphatase 48 38 - 126 U/L  Total Bilirubin 0.9 0.3 - 1.2 mg/dL   GFR calc non Af Amer 37 (L) >60 mL/min   GFR calc Af Amer 43 (L) >60 mL/min   Anion gap 12 5 - 15    Comment: Performed at [facility], [place] [address] [place], [place] [address]  CBC     Status: Abnormal   Collection Time: 04/06/18  8:17 AM  Result Value Ref Range   WBC 7.2 4.0 - 10.5 K/uL   RBC 4.96 3.87 - 5.11 MIL/uL   Hemoglobin 16.2 (H) 12.0 - 15.0 g/dL   HCT 47.9 (H) 36.0 - 46.0 %   MCV 96.6 80.0 - 100.0 fL   MCH 32.7 26.0 - 34.0 pg   MCHC 33.8 30.0 - 36.0 g/dL   RDW 13.0 11.5 - 15.5 %   Platelets 314 150 - 400 K/uL   nRBC 0.0 0.0 - 0.2 %    Comment: Performed at [facility], [place] [address] [place], [place] [address]  Urinalysis, Routine w reflex microscopic     Status: Abnormal   Collection Time: 04/06/18 10:48 AM  Result Value Ref Range   Color, Urine YELLOW YELLOW   APPearance HAZY (A) CLEAR   Specific Gravity, Urine 1.010 1.005 - 1.030   pH  6.0 5.0 - 8.0   Glucose, UA NEGATIVE NEGATIVE mg/dL   Hgb urine dipstick NEGATIVE NEGATIVE   Bilirubin Urine NEGATIVE NEGATIVE   Ketones, ur 5 (A) NEGATIVE mg/dL   Protein, ur NEGATIVE NEGATIVE mg/dL   Nitrite NEGATIVE NEGATIVE   Leukocytes, UA NEGATIVE NEGATIVE    Comment: Performed at [place] [address], [place],  [address]    Dg Abd Acute 2+v W 1v Chest  Result Date: 04/06/2018 CLINICAL DATA:  Vomiting since Saturday with abdominal pain EXAM: DG ABDOMEN ACUTE W/ 1V CHEST COMPARISON:  11/12/2016 abdominal CT. FINDINGS: Normal heart size and mediastinal contours. Thyroidectomy clips. No acute infiltrate or edema. No effusion or pneumothorax. No acute osseous findings. Normal bowel gas pattern. No evident pneumoperitoneum. No concerning mass effect or calcification. IMPRESSION: No acute finding in the chest or abdomen. Electronically Signed   By: Monte Fantasia M.D.   On: 04/06/2018 09:12   Dg Finger Thumb Right  Result Date: 04/06/2018 CLINICAL DATA:  Right thumb infection for a couple of weeks EXAM: RIGHT THUMB 2+V COMPARISON:  None. FINDINGS: Soft tissue swelling over the distal distal dorsal thumb. There is erosion of the tuft as marked on the AP image. No joint narrowing or joint erosions. Advanced first [place] osteoarthritis. IMPRESSION: Soft tissue swelling with irregularity of the tuft concerning for osteomyelitis. Electronically Signed   By: Monte Fantasia M.D.   On: 04/06/2018 09:14    Review of Systems  Constitutional: Negative for chills, fever and weight loss.  HENT: Negative for ear discharge, ear pain, hearing loss and tinnitus.   Eyes: Negative for blurred vision, double vision, photophobia and pain.  Respiratory: Negative for cough, sputum production and shortness of breath.   Cardiovascular: Negative for chest pain.  Gastrointestinal: Positive for nausea and vomiting. Negative for abdominal pain.  Genitourinary: Negative for dysuria, flank  pain, frequency and urgency.  Musculoskeletal: Positive for joint pain (Right thumb). Negative for back pain, falls, myalgias and neck pain.  Neurological: Negative for dizziness, tingling, sensory change, focal weakness, loss of consciousness and headaches.  Endo/Heme/Allergies: Does not bruise/bleed easily.  Psychiatric/Behavioral: Negative for depression, memory loss and substance abuse. The patient is not nervous/anxious.  Blood pressure 131/84, pulse 63, temperature 97.9 F (36.6 C), temperature source Rectal, resp. rate 13, height 5\' 7"  (1.702 m), weight 83.9 kg, SpO2 97 %. Physical Exam  Constitutional: She appears well-developed and well-nourished. No distress.  HENT:  Head: Normocephalic and atraumatic.  Eyes: Conjunctivae are normal. Right eye exhibits no discharge. Left eye exhibits no discharge. No scleral icterus.  Neck: Normal range of motion.  Cardiovascular: Normal rate and regular rhythm.  Respiratory: Effort normal. No respiratory distress.  Musculoskeletal:     Comments: Right shoulder, elbow, wrist, digits- no skin wounds, thumb TTP, edematous, fluctuant proximal to nail, no instability, no blocks to motion  Sens  Ax/R/M/U intact except paresthesias distal radial thumb  Mot   Ax/ R/ PIN/ M/ AIN/ U intact  Rad 1+  Neurological: She is alert.  Skin: Skin is warm and dry. She is not diaphoretic.  Psychiatric: She has a normal mood and affect. Her behavior is normal.    Assessment/Plan: Right thumb paronychia -- This should be amenable to bedside I&D by EDP with oral abx for home. If she fails to improve or this recurs then can f/u with Dr. Caralyn Guile in office. Otherwise she should f/u with PCP.    Lisette Abu, PA-C Orthopedic Surgery [phone] 04/06/2018, 11:07 AM

## 2018-04-06 NOTE — ED Notes (Signed)
Patient transported to CT 

## 2018-04-11 LAB — AEROBIC/ANAEROBIC CULTURE W GRAM STAIN (SURGICAL/DEEP WOUND): Culture: NORMAL

## 2018-04-12 ENCOUNTER — Telehealth: Payer: Self-pay | Admitting: Emergency Medicine

## 2018-04-12 NOTE — Telephone Encounter (Signed)
Post ED Visit - Positive Culture Follow-up  Culture report reviewed by antimicrobial stewardship pharmacist:  []  Elenor Quinones, Pharm.D. []  Heide Guile, Pharm.D., BCPS AQ-ID []  Parks Neptune, Pharm.D., BCPS []  Alycia Rossetti, Pharm.D., BCPS []  [place], Pharm.D., BCPS, AAHIVP []  Legrand Como, Pharm.D., BCPS, AAHIVP []  Salome Arnt, PharmD, BCPS []  Johnnette Gourd, PharmD, BCPS []  Hughes Better, PharmD, BCPS [x]  Albertina Parr, PharmD   Treated with Doxycycline,  no further patient follow-up is required at this time.  Larene Beach Marijane Trower 04/12/2018, 1:03 PM

## 2018-09-05 ENCOUNTER — Emergency Department (HOSPITAL_COMMUNITY): Payer: Medicare Other

## 2018-09-05 ENCOUNTER — Emergency Department (HOSPITAL_COMMUNITY)
Admission: EM | Admit: 2018-09-05 | Discharge: 2018-09-06 | Disposition: A | Discharge status: Home / Self Care | Payer: Medicare Other | Attending: Emergency Medicine | Admitting: Emergency Medicine

## 2018-09-05 DIAGNOSIS — R4182 Altered mental status, unspecified: Secondary | ICD-10-CM | POA: Insufficient documentation

## 2018-09-05 DIAGNOSIS — Y92099 Unspecified place in other non-institutional residence as the place of occurrence of the external cause: Secondary | ICD-10-CM | POA: Diagnosis not present

## 2018-09-05 DIAGNOSIS — R52 Pain, unspecified: Secondary | ICD-10-CM

## 2018-09-05 DIAGNOSIS — W208XXA Other cause of strike by thrown, projected or falling object, initial encounter: Secondary | ICD-10-CM | POA: Diagnosis not present

## 2018-09-05 DIAGNOSIS — Y999 Unspecified external cause status: Secondary | ICD-10-CM | POA: Diagnosis not present

## 2018-09-05 DIAGNOSIS — Y939 Activity, unspecified: Secondary | ICD-10-CM | POA: Insufficient documentation

## 2018-09-05 DIAGNOSIS — S91312A Laceration without foreign body, left foot, initial encounter: Secondary | ICD-10-CM | POA: Diagnosis present

## 2018-09-05 DIAGNOSIS — S92505A Nondisplaced unspecified fracture of left lesser toe(s), initial encounter for closed fracture: Secondary | ICD-10-CM

## 2018-09-05 DIAGNOSIS — S92515A Nondisplaced fracture of proximal phalanx of left lesser toe(s), initial encounter for closed fracture: Secondary | ICD-10-CM | POA: Diagnosis not present

## 2018-09-05 LAB — I-STAT CHEM 8, ED
BUN: 23 mg/dL — ABNORMAL HIGH (ref 6–20)
Calcium, Ion: 1.12 mmol/L — ABNORMAL LOW (ref 1.15–1.40)
Chloride: 109 mmol/L (ref 98–111)
Creatinine, Ser: 1.9 mg/dL — ABNORMAL HIGH (ref 0.44–1.00)
Glucose, Bld: 119 mg/dL — ABNORMAL HIGH (ref 70–99)
HCT: 32 % — ABNORMAL LOW (ref 36.0–46.0)
Hemoglobin: 10.9 g/dL — ABNORMAL LOW (ref 12.0–15.0)
Potassium: 3.2 mmol/L — ABNORMAL LOW (ref 3.5–5.1)
Sodium: 139 mmol/L (ref 135–145)
TCO2: 20 mmol/L — ABNORMAL LOW (ref 22–32)

## 2018-09-05 LAB — CBC WITH DIFFERENTIAL/PLATELET
Abs Immature Granulocytes: 0.02 10*3/uL (ref 0.00–0.07)
Basophils Absolute: 0.1 10*3/uL (ref 0.0–0.1)
Basophils Relative: 1 %
Eosinophils Absolute: 0.1 10*3/uL (ref 0.0–0.5)
Eosinophils Relative: 2 %
HCT: 31.9 % — ABNORMAL LOW (ref 36.0–46.0)
Hemoglobin: 10.5 g/dL — ABNORMAL LOW (ref 12.0–15.0)
Immature Granulocytes: 0 %
Lymphocytes Relative: 45 %
Lymphs Abs: 2.4 10*3/uL (ref 0.7–4.0)
MCH: 33 pg (ref 26.0–34.0)
MCHC: 32.9 g/dL (ref 30.0–36.0)
MCV: 100.3 fL — ABNORMAL HIGH (ref 80.0–100.0)
Monocytes Absolute: 0.3 10*3/uL (ref 0.1–1.0)
Monocytes Relative: 6 %
Neutro Abs: 2.5 10*3/uL (ref 1.7–7.7)
Neutrophils Relative %: 46 %
Platelets: 142 10*3/uL — ABNORMAL LOW (ref 150–400)
RBC: 3.18 MIL/uL — ABNORMAL LOW (ref 3.87–5.11)
RDW: 13.2 % (ref 11.5–15.5)
WBC: 5.4 10*3/uL (ref 4.0–10.5)
nRBC: 0 % (ref 0.0–0.2)

## 2018-09-05 LAB — COMPREHENSIVE METABOLIC PANEL
ALT: 28 U/L (ref 0–44)
AST: 82 U/L — ABNORMAL HIGH (ref 15–41)
Albumin: 3.9 g/dL (ref 3.5–5.0)
Alkaline Phosphatase: 38 U/L (ref 38–126)
Anion gap: 11 (ref 5–15)
BUN: 23 mg/dL — ABNORMAL HIGH (ref 6–20)
CO2: 19 mmol/L — ABNORMAL LOW (ref 22–32)
Calcium: 8.7 mg/dL — ABNORMAL LOW (ref 8.9–10.3)
Chloride: 108 mmol/L (ref 98–111)
Creatinine, Ser: 1.74 mg/dL — ABNORMAL HIGH (ref 0.44–1.00)
GFR calc Af Amer: 37 mL/min — ABNORMAL LOW (ref >60)
GFR calc non Af Amer: 32 mL/min — ABNORMAL LOW (ref >60)
Glucose, Bld: 123 mg/dL — ABNORMAL HIGH (ref 70–99)
Potassium: 3.1 mmol/L — ABNORMAL LOW (ref 3.5–5.1)
Sodium: 138 mmol/L (ref 135–145)
Total Bilirubin: 0.6 mg/dL (ref 0.3–1.2)
Total Protein: 6.2 g/dL — ABNORMAL LOW (ref 6.5–8.1)

## 2018-09-05 LAB — SALICYLATE LEVEL: Salicylate Lvl: <7 mg/dL (ref 2.8–30.0)

## 2018-09-05 LAB — ACETAMINOPHEN LEVEL: Acetaminophen (Tylenol), Serum: <10 ug/mL — ABNORMAL LOW (ref 10–30)

## 2018-09-05 LAB — ETHANOL: Alcohol, Ethyl (B): 80 mg/dL — ABNORMAL HIGH (ref <10)

## 2018-09-05 MED ORDER — CEFAZOLIN SODIUM-DEXTROSE 1-4 GM/50ML-% IV SOLN
1.0000 g | Freq: Once | INTRAVENOUS | Status: AC
  Administered 2018-09-06: 1 g via INTRAVENOUS
  Filled 2018-09-05: qty 50

## 2018-09-05 MED ORDER — CEPHALEXIN 500 MG PO CAPS: 500.0000 mg | ORAL_CAPSULE | Freq: Two times a day (BID) | ORAL | 0 refills | Status: DC

## 2018-09-05 MED ORDER — SODIUM CHLORIDE 0.9 % IV SOLN
INTRAVENOUS | Status: AC | PRN
  Administered 2018-09-05 (×2): 1000 mL via INTRAVENOUS

## 2018-09-05 MED ORDER — NALOXONE HCL 0.4 MG/ML IJ SOLN
INTRAMUSCULAR | Status: AC
  Filled 2018-09-05: qty 1

## 2018-09-05 MED ORDER — NALOXONE HCL 2 MG/2ML IJ SOSY
PREFILLED_SYRINGE | INTRAMUSCULAR | Status: AC | PRN
  Administered 2018-09-05: 0.4 mg via INTRAVENOUS

## 2018-09-05 NOTE — ED Notes (Signed)
70/44 manual - Dr. Dian Situ bedside.  Ordered and started two lts of NS.  Provider attempted to start NPA, pt woke up.  Pt denies taking any medications.

## 2018-09-05 NOTE — ED Notes (Signed)
Pt continues to try to take O2 off.  Pt is becoming agitated then falls back to sleep

## 2018-09-05 NOTE — ED Triage Notes (Signed)
Pt from home, called EMS told them a pot fell on her foot and was bleeding.  Pt became very combative when EMS arrived, given 5mg  Haldol and 5mg  versed.  Pt continued to fight EMS, Gave an extra 5mg  of versed.  EMS estimates blood loss to be 1.5Liters. Upon arrival, pt hypotensive and only responsive to painful stimuli.

## 2018-09-05 NOTE — ED Provider Notes (Signed)
[facility] EMERGENCY DEPARTMENT Provider Note   CSN: [id number] Arrival date & time: 09/05/18  2208    History   Chief Complaint Chief Complaint  Patient presents with   Trauma    HPI NUSAIBA GUALLPA is a 58 y.o. female.     The history is provided by the EMS personnel and medical records.  Trauma Mechanism of injury: Pt reportedly dropped a "pan" on her foot Injury location: foot Injury location detail: L foot Incident location: home Time since incident: 1 hour Arrived directly from scene: yes       Suspicion of alcohol use: yes      Suspicion of drug use: yes  EMS/PTA data:      Bystander interventions: wound care      Blood loss: moderate      Responsiveness: alert   No past medical history on file.  There are no active problems to display for this patient.    OB History   No obstetric history on file.      Home Medications    Prior to Admission medications   Not on File    Family History No family history on file.  Social History Social History   Tobacco Use   Smoking status: Not on file  Substance Use Topics   Alcohol use: Not on file   Drug use: Not on file     Allergies   Patient has no allergy information on record.   Review of Systems Review of Systems  Unable to perform ROS: Mental status change     Physical Exam Updated Vital Signs BP (!) 58/47    Pulse 73    Resp 12    Ht 5\' 7"  (1.702 m)    Wt 113.4 kg    SpO2 99%    BMI 39.16 kg/m   Physical Exam Trauma Assessment - PRIMARY SURVEY: Airway:  Patient is not alert, airway is opened easily with jaw-thrust and clear of any obvious obstruction or debris  Breathing:  Spontaneous symmetric chest wall expansion  Breath sounds: c/= in all fields  RR: 12  Sp02: 99% on RA  Pneumothorax: no  Hemothorax: no  Circulation: Heart Rate: 73 Blood Pressure: 73/49 Extremities: warm IV Access: requires placement of an additional pIV  Disability: GCS:  9 PEARRL: Yes Limbs noted to be moving: not moving extremities spontaneously at this time 2/2 mental status  Interventions during Primary Survey Chest Tube required: no Intubation/Advanced Airway interventions required: no Other: jaw-trust manipulation of airway    Trauma Assessment - SECONDARY EXAM GENERAL: 58 y.o. year old female who appears older than reported age, appears dissheveled  HEAD:  Normocephalic  Atraumatic  FACE:  Midface is stable  There are no obvious facial contusions, abrasions, lacerations or deformities.   EYES:   Pupils are equal, round, reactive to light and are 2 mm bilaterally   Conjunctivae normal, anicteric   ENT:  External ears normal and there is no battle's sign   Nose normal  Oropharynx is clear without blood  Poor dentition, missing multiple teeth, dentures in place  Cervical collar is not in place  Trachea is midline  CV:  Regular rate and rhythm  S1/S2 without obvious murmur, skip, gallop  Radial pulses are 1+ bilaterally  DP pulses are 1+ bilaterally  PULMONARY & CHEST:   Symmetric chest wall expansion  No accessory muscle use or other signs of respiratory distress > pt with snoring respirations when jaw-thrust is not applied  Bilateral breath sounds are clear and equal, no wheezes, rhonchi or rales  No obvious chest wall deformity and does not respond to chest wall palpation with grimace or other signs of concern  ABDOMINAL:  Soft, non-distended   MSK:   pROM intact, patients mentation is diminished at this time and does not independently make any effort to move extremities   SKIN:  Warm, pink and dry  Left Lower Extremity Tourniquet was placed to the left calf and bandaging was applied over the left foot, significant dried blood is noted around the distal portions of the left foot and all toes. -On removal of the tourniquet, there is no active bleeding.  There is a small laceration overlying the dorsum of the left  midfoot at the area of the neck of the first metatarsal. Hemostatic. No evidence of foreign body. DP pulses intact   NEURO:  Obtunded   GCS: Glasgow Coma Scale  Eyes: open to pain (2)    Verbal: Inappropriate words (3)  Motor: Withdrawal from pain (4)  Total: 9      ED Treatments / Results  Labs (all labs ordered are listed, but only abnormal results are displayed) Labs Reviewed  COMPREHENSIVE METABOLIC PANEL - Abnormal; Notable for the following components:      Result Value   Potassium 3.1 (*)    CO2 19 (*)    Glucose, Bld 123 (*)    BUN 23 (*)    Creatinine, Ser 1.74 (*)    Calcium 8.7 (*)    Total Protein 6.2 (*)    AST 82 (*)    GFR calc non Af Amer 32 (*)    GFR calc Af Amer 37 (*)    All other components within normal limits  ETHANOL - Abnormal; Notable for the following components:   Alcohol, Ethyl (B) 80 (*)    All other components within normal limits  CBC WITH DIFFERENTIAL/PLATELET - Abnormal; Notable for the following components:   RBC 3.18 (*)    Hemoglobin 10.5 (*)    HCT 31.9 (*)    MCV 100.3 (*)    Platelets 142 (*)    All other components within normal limits  ACETAMINOPHEN LEVEL - Abnormal; Notable for the following components:   Acetaminophen (Tylenol), Serum <10 (*)    All other components within normal limits  I-STAT CHEM 8, ED - Abnormal; Notable for the following components:   Potassium 3.2 (*)    BUN 23 (*)    Creatinine, Ser 1.90 (*)    Glucose, Bld 119 (*)    Calcium, Ion 1.12 (*)    TCO2 20 (*)    Hemoglobin 10.9 (*)    HCT 32.0 (*)    All other components within normal limits  SALICYLATE LEVEL  RAPID URINE DRUG SCREEN, HOSP PERFORMED  TYPE AND SCREEN  PREPARE FRESH FROZEN PLASMA  ABO/RH    EKG None  Radiology Dg Chest Portable 1 View  Result Date: 09/05/2018 CLINICAL DATA:  Altered mental status EXAM: PORTABLE CHEST 1 VIEW COMPARISON:  None. FINDINGS: Low lung volumes. Mild cardiomegaly. No focal airspace disease or  effusion. No pneumothorax. Surgical changes at the thoracic inlet. IMPRESSION: No active disease.  Mild cardiomegaly Electronically Signed   By: Donavan Foil M.D.   On: 09/05/2018 23:04   Dg Foot 2 Views Left  Result Date: 09/05/2018 CLINICAL DATA:  Trauma laceration EXAM: LEFT FOOT - 2 VIEW COMPARISON:  None. FINDINGS: No malalignment. Moderate arthritis at the first MTP joint.  Possible acute fracture at the base of the fourth proximal phalanx. IMPRESSION: 1. Possible acute nondisplaced fracture at the base of the fourth proximal phalanx 2. Arthritis at the first MTP joint Electronically Signed   By: Donavan Foil M.D.   On: 09/05/2018 23:06    Procedures Procedures (including critical care time)  Medications Ordered in ED Medications  naloxone (NARCAN) 0.4 MG/ML injection (has no administration in time range)  naloxone [facility]) injection (0.4 mg Intravenous Given 09/05/18 2214)  0.9 %  sodium chloride infusion ( Intravenous Stopped 09/06/18 0055)  ceFAZolin (ANCEF) IVPB 1 g/50 mL premix (1 g Intravenous New Bag/Given 09/06/18 0059)     Initial Impression / Assessment and Plan / ED Course  I have reviewed the triage vital signs and the nursing notes.  Pertinent labs & imaging results that were available during my care of the patient were reviewed by me and considered in my medical decision making (see chart for details).  HUSNA KRONE is a 58 y.o. female who presented to the ED by EMS as an activated Level 1 trauma for injuries secondary to questionable circumstances and situation as it was reported that she dropped a pan on her left foot, however, the injury noted to this area appears consistent with a laceration.  When EMS arrived to the patient's home they found there to be a large amount of blood stained and continued active bleeding from the left foot that was described to be pulsating.  EMS estimated blood loss seen at the home to be around 1 L.  The patient was reportedly significantly  combative and noncooperative with EMS and patient was ultimately given Versed intramuscularly in order to safely assess her.  Prior to arrival of the patient, the room was prepared with the following: code cart to bedside, video laryngoscope, suction x1, BVM. Trauma team was present prior to arrival of the patient.    Upon arrival, EMS provided pertinent history and exam findings. The patient was transferred over to the ED treatment bed.   ABCs intact as exam above. Once 2 IVs were confirmed, portable X-rays of the chest and left were obtained and the secondary exam was performed.  Examination of the patient was complicated by her diminished mentation with a GCS of 9, though given the circumstances described by EMS there is concern for polysubstance intoxication or psychiatric illness. No need clinically for advanced airway placement at this time, however, she may ultimately require this as she did require jaw thrust manipulation just after arrival and she has only been responsive to pain.  At the time of shift change/patient care handoff the patient's ED course and work-up is not yet complete. Care transferred at 0000 hrs to Dr. Veryl Speak, please see their additional note in the patient's EMR for further details of ED course and final disposition.   The care of this patient was supervised by Dr. Gareth Morgan, who agreed with the plan and management of the patient.   Final Clinical Impressions(s) / ED Diagnoses   Final diagnoses:  Laceration of left foot, initial encounter  Closed nondisplaced fracture of phalanx of lesser toe of left foot, unspecified phalanx, initial encounter  Altered mental status, unspecified altered mental status type    ED Discharge Orders         Ordered    cephALEXin (KEFLEX) 500 MG capsule  2 times daily,   Status:  Discontinued     09/05/18 2349  Jefm Petty, MD 09/06/18 Natasha Mead    Gareth Morgan, MD 09/07/18 1459

## 2018-09-05 NOTE — Progress Notes (Signed)
Patient ID: Raven [name], female   DOB: 05-05-1960, 58 y.o.   MRN: [id number] 57yof who apparently dropped something from kitchen on her left foot.  There was a laceration and she lost some blood at scene.  First responders arrived and she was combative.  She received 7 mg of haldol and 7 mg of versed. Her bp was then noted to be lower than 90. A level one was called.  On arrival she is somnolent.  I removed the lle tourniquet and the bandage. There is a 1 cm laceration on dorsomedial left foot. There is no active bleeding. She did move the foot and leg. She will not follow commands due to mental status.  There is a palpable pulse.  I dont think any indication for trauma admission with this. Mental status issues appear related to medications and Dr Raven [name] is evaluating why she was so combative. Please call back if needed

## 2018-09-06 DIAGNOSIS — S91312A Laceration without foreign body, left foot, initial encounter: Secondary | ICD-10-CM | POA: Diagnosis not present

## 2018-09-06 LAB — TYPE AND SCREEN
ABO/RH(D): A POS
Antibody Screen: NEGATIVE
Unit division: 0
Unit division: 0

## 2018-09-06 LAB — BPAM RBC
Blood Product Expiration Date: 202008022359
Blood Product Expiration Date: 202008022359
ISSUE DATE / TIME: 202007042159
ISSUE DATE / TIME: 202007042159
Unit Type and Rh: 9500
Unit Type and Rh: 9500

## 2018-09-06 LAB — PREPARE FRESH FROZEN PLASMA: Unit division: 0

## 2018-09-06 LAB — BPAM FFP
Blood Product Expiration Date: 202007072359
Blood Product Expiration Date: 202007072359
ISSUE DATE / TIME: 202007042159
ISSUE DATE / TIME: 202007042159
Unit Type and Rh: 6200
Unit Type and Rh: 6200

## 2018-09-06 LAB — ABO/RH: ABO/RH(D): A POS

## 2018-09-06 NOTE — ED Notes (Addendum)
Pt urinated in bed, linens and gown changed.  Pt was very wobbly standing, had to two person assist to standup straight.  Provider bedside.  Pt oriented to place, time and date however questionable to oriented to situation.

## 2018-09-06 NOTE — ED Notes (Signed)
Attempted to call brother with no answer

## 2018-09-06 NOTE — ED Notes (Signed)
Spoke to brother, Shanon Brow who stated he lives in [place] but will contact closer family to come pick up pt

## 2018-09-06 NOTE — ED Notes (Signed)
Attempted to call pt's brother, Shanon Brow to pick up pt. No answer

## 2018-09-06 NOTE — ED Provider Notes (Signed)
Care signed out to me at shift change.  Patient was brought here as an apparent level 1 trauma after dropping a pot on her foot in her kitchen at her home.  There was apparently significant bleeding coming from a laceration on her foot.  The patient had what sounds like some sort of psychotic episode prior to transport during which time she required Haldol and Versed.  She was then transported here and remained combative and inconsolable.  Patient arrived here with her bleeding controlled, but remains somewhat agitated.  X-rays of the foot show a possible second metatarsal fracture, but are otherwise unremarkable.  Care was signed out to me with instructions to reassess the patient when she is more awake and alert.  She is somnolent, likely the result of alcohol consumption mixed with sedating medications given by EMS.  She was reassessed at approximately 5:30 AM.  She denies to me that she is suicidal or homicidal.  She would like to go home, but is uncertain as to how she can get back there.  Care will be signed out to the oncoming provider.  Once patient is stable on her feet and able to walk, I feel as though she is appropriate for discharge.  There may be some demographic issues about how to get her physically transported home.  Possibly a cab or a bus voucher would be of assistance.   Veryl Speak, MD 09/06/18 [phone]

## 2018-09-06 NOTE — ED Notes (Signed)
Gave pt graham crackers and apple juice, Ryan - Therapist, sports.

## 2018-09-06 NOTE — Discharge Instructions (Addendum)
Continue medications as previously prescribed.  Local wound care with bacitracin and dressing changes twice daily.  Return to the emergency department if you experience any new and/or concerning symptoms.

## 2018-09-06 NOTE — ED Notes (Signed)
Informed provider of bp, will come bedside.  Started another bolus.

## 2018-09-21 ENCOUNTER — Encounter (HOSPITAL_COMMUNITY): Payer: Self-pay | Admitting: Emergency Medicine

## 2018-09-21 ENCOUNTER — Other Ambulatory Visit: Payer: Self-pay

## 2018-09-21 ENCOUNTER — Emergency Department (HOSPITAL_COMMUNITY): Payer: Medicare Other

## 2018-09-21 ENCOUNTER — Emergency Department (HOSPITAL_COMMUNITY)
Admission: EM | Admit: 2018-09-21 | Discharge: 2018-09-21 | Disposition: A | Discharge status: Home / Self Care | Payer: Medicare Other | Attending: Emergency Medicine | Admitting: Emergency Medicine

## 2018-09-21 DIAGNOSIS — F17210 Nicotine dependence, cigarettes, uncomplicated: Secondary | ICD-10-CM | POA: Diagnosis not present

## 2018-09-21 DIAGNOSIS — E119 Type 2 diabetes mellitus without complications: Secondary | ICD-10-CM | POA: Insufficient documentation

## 2018-09-21 DIAGNOSIS — Z7984 Long term (current) use of oral hypoglycemic drugs: Secondary | ICD-10-CM | POA: Diagnosis not present

## 2018-09-21 DIAGNOSIS — R112 Nausea with vomiting, unspecified: Secondary | ICD-10-CM | POA: Insufficient documentation

## 2018-09-21 DIAGNOSIS — E039 Hypothyroidism, unspecified: Secondary | ICD-10-CM | POA: Diagnosis not present

## 2018-09-21 DIAGNOSIS — Z79899 Other long term (current) drug therapy: Secondary | ICD-10-CM | POA: Diagnosis not present

## 2018-09-21 DIAGNOSIS — R109 Unspecified abdominal pain: Secondary | ICD-10-CM | POA: Insufficient documentation

## 2018-09-21 LAB — URINALYSIS, ROUTINE W REFLEX MICROSCOPIC
Bilirubin Urine: NEGATIVE
Glucose, UA: NEGATIVE mg/dL
Hgb urine dipstick: NEGATIVE
Ketones, ur: NEGATIVE mg/dL
Leukocytes,Ua: NEGATIVE
Nitrite: NEGATIVE
Protein, ur: 30 mg/dL — AB
Specific Gravity, Urine: 1.024 (ref 1.005–1.030)
pH: 5 (ref 5.0–8.0)

## 2018-09-21 LAB — CBC WITH DIFFERENTIAL/PLATELET
Abs Immature Granulocytes: 0.02 10*3/uL (ref 0.00–0.07)
Basophils Absolute: 0 10*3/uL (ref 0.0–0.1)
Basophils Relative: 1 %
Eosinophils Absolute: 0 10*3/uL (ref 0.0–0.5)
Eosinophils Relative: 0 %
HCT: 39.6 % (ref 36.0–46.0)
Hemoglobin: 13.3 g/dL (ref 12.0–15.0)
Immature Granulocytes: 0 %
Lymphocytes Relative: 22 %
Lymphs Abs: 1.4 10*3/uL (ref 0.7–4.0)
MCH: 32.8 pg (ref 26.0–34.0)
MCHC: 33.6 g/dL (ref 30.0–36.0)
MCV: 97.5 fL (ref 80.0–100.0)
Monocytes Absolute: 0.3 10*3/uL (ref 0.1–1.0)
Monocytes Relative: 5 %
Neutro Abs: 4.5 10*3/uL (ref 1.7–7.7)
Neutrophils Relative %: 72 %
Platelets: 322 10*3/uL (ref 150–400)
RBC: 4.06 MIL/uL (ref 3.87–5.11)
RDW: 13.5 % (ref 11.5–15.5)
WBC: 6.3 10*3/uL (ref 4.0–10.5)
nRBC: 0 % (ref 0.0–0.2)

## 2018-09-21 LAB — HEPATIC FUNCTION PANEL
ALT: 16 U/L (ref 0–44)
AST: 31 U/L (ref 15–41)
Albumin: 4.7 g/dL (ref 3.5–5.0)
Alkaline Phosphatase: 50 U/L (ref 38–126)
Bilirubin, Direct: 0.1 mg/dL (ref 0.0–0.2)
Indirect Bilirubin: 0.4 mg/dL (ref 0.3–0.9)
Total Bilirubin: 0.5 mg/dL (ref 0.3–1.2)
Total Protein: 8.2 g/dL — ABNORMAL HIGH (ref 6.5–8.1)

## 2018-09-21 LAB — I-STAT CHEM 8, ED
BUN: 13 mg/dL (ref 6–20)
Calcium, Ion: 1.17 mmol/L (ref 1.15–1.40)
Chloride: 97 mmol/L — ABNORMAL LOW (ref 98–111)
Creatinine, Ser: 1.2 mg/dL — ABNORMAL HIGH (ref 0.44–1.00)
Glucose, Bld: 135 mg/dL — ABNORMAL HIGH (ref 70–99)
HCT: 40 % (ref 36.0–46.0)
Hemoglobin: 13.6 g/dL (ref 12.0–15.0)
Potassium: 4 mmol/L (ref 3.5–5.1)
Sodium: 133 mmol/L — ABNORMAL LOW (ref 135–145)
TCO2: 24 mmol/L (ref 22–32)

## 2018-09-21 MED ORDER — ONDANSETRON HCL 4 MG/2ML IJ SOLN
4.0000 mg | Freq: Once | INTRAMUSCULAR | Status: AC
  Administered 2018-09-21: 4 mg via INTRAVENOUS
  Filled 2018-09-21: qty 2

## 2018-09-21 MED ORDER — ALUM & MAG HYDROXIDE-SIMETH 200-200-20 MG/5ML PO SUSP
30.0000 mL | Freq: Once | ORAL | Status: AC
  Administered 2018-09-21: 30 mL via ORAL
  Filled 2018-09-21: qty 30

## 2018-09-21 MED ORDER — DICYCLOMINE HCL 20 MG PO TABS: 20.0000 mg | ORAL_TABLET | Freq: Two times a day (BID) | ORAL | 0 refills | Status: DC

## 2018-09-21 MED ORDER — KETOROLAC TROMETHAMINE 30 MG/ML IJ SOLN
15.0000 mg | Freq: Once | INTRAMUSCULAR | Status: AC
  Administered 2018-09-21: 15 mg via INTRAVENOUS
  Filled 2018-09-21: qty 1

## 2018-09-21 MED ORDER — OMEPRAZOLE 20 MG PO CPDR: 20.0000 mg | DELAYED_RELEASE_CAPSULE | Freq: Every day | ORAL | 0 refills | Status: DC

## 2018-09-21 MED ORDER — DICYCLOMINE HCL 10 MG PO CAPS
10.0000 mg | ORAL_CAPSULE | Freq: Once | ORAL | Status: AC
  Administered 2018-09-21: 10 mg via ORAL
  Filled 2018-09-21: qty 1

## 2018-09-21 NOTE — ED Notes (Signed)
PT transported to CT>

## 2018-09-21 NOTE — ED Notes (Signed)
Asked for urine  

## 2018-09-21 NOTE — ED Provider Notes (Signed)
[place] DEPT Provider Note   CSN: [id number] Arrival date & time: 09/21/18  0129     History   Chief Complaint Chief Complaint  Patient presents with   Abdominal Pain   Nausea   Emesis    HPI Raven [name] is a 58 y.o. female.     The history is provided by the patient. No language interpreter was used.  Abdominal Pain Pain quality: cramping   Pain radiates to:  Does not radiate Pain severity:  Severe Onset quality:  Gradual Duration:  3 days Timing:  Constant Progression:  Unchanged Chronicity:  New Context: not alcohol use and not medication withdrawal   Relieved by:  Nothing Worsened by:  Nothing Ineffective treatments:  None tried Associated symptoms: vomiting   Associated symptoms: no anorexia, no belching, no chest pain, no chills, no constipation, no cough, no diarrhea, no dysuria, no fatigue, no fever, no flatus, no hematemesis, no hematochezia, no hematuria, no melena, no nausea, no shortness of breath, no sore throat, no vaginal bleeding and no vaginal discharge   Risk factors: has not had multiple surgeries   Emesis Associated symptoms: abdominal pain   Associated symptoms: no chills, no cough, no diarrhea, no fever and no sore throat     Past Medical History:  Diagnosis Date   Anxiety    Arthritis    all joints and thumbs   Bladder tumor    Glaucoma of both eyes    History of drug abuse in remission ([place])    hx crack use--  recovery since 2003   Hyperlipidemia    OSA (obstructive sleep apnea)    per pt study 2008  no cpap   Post-surgical hypothyroidism    Severe recurrent major depression with psychotic features [facility])    followed by psychologist--  dr Berneta Sages plovsky   Type 2 diabetes mellitus ([place])    Wears dentures    upper   Wears glasses     Patient Active Problem List   Diagnosis Date Noted   Hyperlipidemia 10/18/2017   Severe recurrent major depressive disorder with psychotic  features ([place]) 10/13/2014   Diabetes mellitus, type 2 ([place]) 03/03/2014   Hypothyroidism 03/03/2014    Past Surgical History:  Procedure Laterality Date   COLONOSCOPY  Nov 2016   STRABISMUS SURGERY Bilateral age 77   THYROIDECTOMY  2004   TRANSURETHRAL RESECTION OF BLADDER TUMOR N/A 04/28/2015   Procedure: TRANSURETHRAL RESECTION OF BLADDER TUMOR (TURBT) ;  Surgeon: Franchot Gallo, MD;  Location: [facility];  Service: Urology;  Laterality: N/A;     OB History   No obstetric history on file.      Home Medications    Prior to Admission medications   Medication Sig Start Date End Date Taking? Authorizing Provider  amLODipine (NORVASC) 5 MG tablet Take 1 tablet (5 mg total) by mouth daily. Patient not taking: Reported on 04/06/2018 03/26/17   Gareth Morgan, MD  ARIPiprazole (ABILIFY) 10 MG tablet 1  qhs Patient not taking: Reported on 04/06/2018 10/17/17   Norma Fredrickson, MD  bimatoprost (LUMIGAN) 0.01 % SOLN Place 1 drop into both eyes at bedtime. Patient not taking: Reported on 04/06/2018 10/14/17   Nuala Alpha, DO  canagliflozin [facility]) 100 MG TABS tablet Take 1 tablet (100 mg total) by mouth daily before breakfast. Patient not taking: Reported on 04/06/2018 10/14/17   Nuala Alpha, DO  doxycycline (VIBRAMYCIN) 100 MG capsule Take 1 capsule (100 mg total) by mouth 2 (two)  times daily. One po bid x 7 days 04/06/18   Sherwood Gambler, MD  gabapentin (NEURONTIN) 300 MG capsule 1  qam   2 qhs Patient not taking: Reported on 04/06/2018 10/17/17   Norma Fredrickson, MD  hydrOXYzine (VISTARIL) 25 MG capsule 1   Tid   2  prn Patient not taking: Reported on 04/06/2018 03/22/15   Norma Fredrickson, MD  metFORMIN (GLUCOPHAGE) 500 MG tablet Take 1 tablet (500 mg total) by mouth 2 (two) times daily with a meal. Patient not taking: Reported on 04/06/2018 10/14/17   Nuala Alpha, DO  metoCLOPramide (REGLAN) 10 MG tablet Take 1 tablet (10 mg total) by mouth every 8 (eight) hours  as needed for nausea or vomiting. 04/06/18   Sherwood Gambler, MD  omeprazole (PRILOSEC) 20 MG capsule Take 1 capsule (20 mg total) by mouth daily. Patient not taking: Reported on 04/06/2018 03/31/14   Lance Bosch, NP  simvastatin (ZOCOR) 40 MG tablet Take 1 tablet (40 mg total) by mouth daily. Patient not taking: Reported on 04/06/2018 10/14/17   Nuala Alpha, DO  Suvorexant (BELSOMRA) 20 MG TABS Take 20 mg/day by mouth Nightly. Patient not taking: Reported on 04/06/2018 10/17/17   Norma Fredrickson, MD  traMADol (ULTRAM) 50 MG tablet Take 1 tablet (50 mg total) by mouth every 6 (six) hours as needed. Patient not taking: Reported on 04/06/2018 04/28/15   Franchot Gallo, MD  traZODone (DESYREL) 100 MG tablet Take 1 tablet (100 mg total) by mouth daily. Patient not taking: Reported on 04/06/2018 10/17/17   Norma Fredrickson, MD    Family History Family History  Problem Relation Age of Onset   Diabetes Mother    Stroke Mother    Heart disease Mother    Cancer Sister    Alcohol abuse Father    Colon cancer Father    Alcohol abuse Sister    Alcohol abuse Brother    Drug abuse Brother    Alcohol abuse Brother    Drug abuse Brother     Social History Social History   Tobacco Use   Smoking status: Current Every Day Smoker    Packs/day: 0.25    Years: 13.00    Pack years: 3.25    Types: Cigarettes   Smokeless tobacco: Never Used  Substance Use Topics   Alcohol use: Yes    Alcohol/week: 3.0 standard drinks    Types: 3 Glasses of wine per week    Comment: wine   Drug use: Yes    Types: "Crack" cocaine, Marijuana     Allergies   Patient has no allergy information on record.   Review of Systems Review of Systems  Constitutional: Negative for chills, fatigue and fever.  HENT: Negative for sore throat.   Respiratory: Negative for cough and shortness of breath.   Cardiovascular: Negative for chest pain.  Gastrointestinal: Positive for abdominal pain and vomiting.  Negative for anorexia, constipation, diarrhea, flatus, hematemesis, hematochezia, melena and nausea.  Genitourinary: Negative for dysuria, hematuria, vaginal bleeding and vaginal discharge.  All other systems reviewed and are negative.    Physical Exam Updated Vital Signs BP (!) 150/79 (BP Location: Right Arm)    Pulse 76    Temp 98.4 F (36.9 C) (Oral)    Resp 16    LMP  (LMP Unknown)    SpO2 100%   Physical Exam Vitals signs and nursing note reviewed.  Constitutional:      General: She is not in acute distress.    Appearance: She  is normal weight.  HENT:     Head: Normocephalic and atraumatic.     Nose: Nose normal.     Mouth/Throat:     Mouth: Mucous membranes are moist.  Eyes:     Conjunctiva/sclera: Conjunctivae normal.     Pupils: Pupils are equal, round, and reactive to light.  Neck:     Musculoskeletal: Normal range of motion and neck supple.  Cardiovascular:     Rate and Rhythm: Normal rate and regular rhythm.     Pulses: Normal pulses.     Heart sounds: Normal heart sounds.  Pulmonary:     Effort: Pulmonary effort is normal.     Breath sounds: Normal breath sounds.  Abdominal:     General: Abdomen is flat. Bowel sounds are normal.     Tenderness: There is no abdominal tenderness. There is no guarding.  Musculoskeletal: Normal range of motion.  Skin:    General: Skin is warm and dry.     Capillary Refill: Capillary refill takes less than 2 seconds.  Neurological:     General: No focal deficit present.     Mental Status: She is alert and oriented to person, place, and time.  Psychiatric:        Mood and Affect: Mood normal.        Behavior: Behavior normal.      ED Treatments / Results  Labs (all labs ordered are listed, but only abnormal results are displayed) Results for orders placed or performed during the hospital encounter of 09/21/18  CBC with Differential/Platelet  Result Value Ref Range   WBC 6.3 4.0 - 10.5 K/uL   RBC 4.06 3.87 - 5.11 MIL/uL     Hemoglobin 13.3 12.0 - 15.0 g/dL   HCT 39.6 36.0 - 46.0 %   MCV 97.5 80.0 - 100.0 fL   MCH 32.8 26.0 - 34.0 pg   MCHC 33.6 30.0 - 36.0 g/dL   RDW 13.5 11.5 - 15.5 %   Platelets 322 150 - 400 K/uL   nRBC 0.0 0.0 - 0.2 %   Neutrophils Relative % 72 %   Neutro Abs 4.5 1.7 - 7.7 K/uL   Lymphocytes Relative 22 %   Lymphs Abs 1.4 0.7 - 4.0 K/uL   Monocytes Relative 5 %   Monocytes Absolute 0.3 0.1 - 1.0 K/uL   Eosinophils Relative 0 %   Eosinophils Absolute 0.0 0.0 - 0.5 K/uL   Basophils Relative 1 %   Basophils Absolute 0.0 0.0 - 0.1 K/uL   Immature Granulocytes 0 %   Abs Immature Granulocytes 0.02 0.00 - 0.07 K/uL  Hepatic function panel  Result Value Ref Range   Total Protein 8.2 (H) 6.5 - 8.1 g/dL   Albumin 4.7 3.5 - 5.0 g/dL   AST 31 15 - 41 U/L   ALT 16 0 - 44 U/L   Alkaline Phosphatase 50 38 - 126 U/L   Total Bilirubin 0.5 0.3 - 1.2 mg/dL   Bilirubin, Direct 0.1 0.0 - 0.2 mg/dL   Indirect Bilirubin 0.4 0.3 - 0.9 mg/dL  Urinalysis, Routine w reflex microscopic  Result Value Ref Range   Color, Urine YELLOW YELLOW   APPearance HAZY (A) CLEAR   Specific Gravity, Urine 1.024 1.005 - 1.030   pH 5.0 5.0 - 8.0   Glucose, UA NEGATIVE NEGATIVE mg/dL   Hgb urine dipstick NEGATIVE NEGATIVE   Bilirubin Urine NEGATIVE NEGATIVE   Ketones, ur NEGATIVE NEGATIVE mg/dL   Protein, ur 30 (A) NEGATIVE mg/dL  Nitrite NEGATIVE NEGATIVE   Leukocytes,Ua NEGATIVE NEGATIVE   RBC / HPF 0-5 0 - 5 RBC/hpf   WBC, UA 0-5 0 - 5 WBC/hpf   Bacteria, UA RARE (A) NONE SEEN   Squamous Epithelial / LPF 11-20 0 - 5   Mucus PRESENT    Hyaline Casts, UA PRESENT   I-stat chem 8, ED (not at [facility] or [facility])  Result Value Ref Range   Sodium 133 (L) 135 - 145 mmol/L   Potassium 4.0 3.5 - 5.1 mmol/L   Chloride 97 (L) 98 - 111 mmol/L   BUN 13 6 - 20 mg/dL   Creatinine, Ser 1.20 (H) 0.44 - 1.00 mg/dL   Glucose, Bld 135 (H) 70 - 99 mg/dL   Calcium, Ion 1.17 1.15 - 1.40 mmol/L   TCO2 24 22 - 32 mmol/L    Hemoglobin 13.6 12.0 - 15.0 g/dL   HCT 40.0 36.0 - 46.0 %   Dg Chest Portable 1 View  Result Date: 09/05/2018 CLINICAL DATA:  Altered mental status EXAM: PORTABLE CHEST 1 VIEW COMPARISON:  None. FINDINGS: Low lung volumes. Mild cardiomegaly. No focal airspace disease or effusion. No pneumothorax. Surgical changes at the thoracic inlet. IMPRESSION: No active disease.  Mild cardiomegaly Electronically Signed   By: Donavan Foil M.D.   On: 09/05/2018 23:04   Dg Foot 2 Views Left  Result Date: 09/05/2018 CLINICAL DATA:  Trauma laceration EXAM: LEFT FOOT - 2 VIEW COMPARISON:  None. FINDINGS: No malalignment. Moderate arthritis at the first MTP joint. Possible acute fracture at the base of the fourth proximal phalanx. IMPRESSION: 1. Possible acute nondisplaced fracture at the base of the fourth proximal phalanx 2. Arthritis at the first MTP joint Electronically Signed   By: Donavan Foil M.D.   On: 09/05/2018 23:06   Ct Renal Stone Study  Result Date: 09/21/2018 CLINICAL DATA:  Abdominal pain and vomiting EXAM: CT ABDOMEN AND PELVIS WITHOUT CONTRAST TECHNIQUE: Multidetector CT imaging of the abdomen and pelvis was performed following the standard protocol without IV contrast. COMPARISON:  None. FINDINGS: LOWER CHEST: There is no basilar pleural or apical pericardial effusion. HEPATOBILIARY: The hepatic contours and density are normal. There is no intra- or extrahepatic biliary dilatation. The gallbladder is normal. PANCREAS: The pancreatic parenchymal contours are normal and there is no ductal dilatation. There is no peripancreatic fluid collection. SPLEEN: Normal. ADRENALS/URINARY TRACT: --Adrenal glands: Normal. --Right kidney/ureter: No hydronephrosis, nephroureterolithiasis, perinephric stranding or solid renal mass. --Left kidney/ureter: No hydronephrosis, nephroureterolithiasis, perinephric stranding or solid renal mass. --Urinary bladder: Normal for degree of distention STOMACH/BOWEL: --Stomach/Duodenum:  There is no hiatal hernia or other gastric abnormality. The duodenal course and caliber are normal. --Small bowel: No dilatation or inflammation. --Colon: No focal abnormality. --Appendix: Not visualized. No right lower quadrant inflammation or free fluid. VASCULAR/LYMPHATIC: Normal course and caliber of the major abdominal vessels. No abdominal or pelvic lymphadenopathy. REPRODUCTIVE: Normal uterus and ovaries. MUSCULOSKELETAL. No bony spinal canal stenosis or focal osseous abnormality. OTHER: None. IMPRESSION: No obstructive uropathy or other acute abdominopelvic abnormality. Electronically Signed   By: Ulyses Jarred M.D.   On: 09/21/2018 03:23    Radiology Ct Renal Stone Study  Result Date: 09/21/2018 CLINICAL DATA:  Abdominal pain and vomiting EXAM: CT ABDOMEN AND PELVIS WITHOUT CONTRAST TECHNIQUE: Multidetector CT imaging of the abdomen and pelvis was performed following the standard protocol without IV contrast. COMPARISON:  None. FINDINGS: LOWER CHEST: There is no basilar pleural or apical pericardial effusion. HEPATOBILIARY: The hepatic contours and density are  normal. There is no intra- or extrahepatic biliary dilatation. The gallbladder is normal. PANCREAS: The pancreatic parenchymal contours are normal and there is no ductal dilatation. There is no peripancreatic fluid collection. SPLEEN: Normal. ADRENALS/URINARY TRACT: --Adrenal glands: Normal. --Right kidney/ureter: No hydronephrosis, nephroureterolithiasis, perinephric stranding or solid renal mass. --Left kidney/ureter: No hydronephrosis, nephroureterolithiasis, perinephric stranding or solid renal mass. --Urinary bladder: Normal for degree of distention STOMACH/BOWEL: --Stomach/Duodenum: There is no hiatal hernia or other gastric abnormality. The duodenal course and caliber are normal. --Small bowel: No dilatation or inflammation. --Colon: No focal abnormality. --Appendix: Not visualized. No right lower quadrant inflammation or free fluid.  VASCULAR/LYMPHATIC: Normal course and caliber of the major abdominal vessels. No abdominal or pelvic lymphadenopathy. REPRODUCTIVE: Normal uterus and ovaries. MUSCULOSKELETAL. No bony spinal canal stenosis or focal osseous abnormality. OTHER: None. IMPRESSION: No obstructive uropathy or other acute abdominopelvic abnormality. Electronically Signed   By: Ulyses Jarred M.D.   On: 09/21/2018 03:23    Procedures Procedures (including critical care time)  Medications Ordered in ED Medications  ketorolac (TORADOL) 30 MG/ML injection 15 mg (15 mg Intravenous Given 09/21/18 0212)  alum & mag hydroxide-simeth (MAALOX/MYLANTA) 200-200-20 MG/5ML suspension 30 mL (30 mLs Oral Given 09/21/18 0213)  dicyclomine (BENTYL) capsule 10 mg (10 mg Oral Given 09/21/18 0212)  ondansetron (ZOFRAN) injection 4 mg (4 mg Intravenous Given 09/21/18 0216)       Final Clinical Impressions(s) / ED Diagnoses   Return for intractable cough, coughing up blood,fevers >100.4 unrelieved by medication, shortness of breath, intractable vomiting, chest pain, shortness of breath, weakness,numbness, changes in speech, facial asymmetry,abdominal pain, passing out,Inability to tolerate liquids or food, cough, altered mental status or any concerns. No signs of systemic illness or infection. The patient is nontoxic-appearing on exam and vital signs are within normal limits.   I have reviewed the triage vital signs and the nursing notes. Pertinent labs &imaging results that were available during my care of the patient were reviewed by me and considered in my medical decision making (see chart for details).  After history, exam, and medical workup I feel the patient has been appropriately medically screened and is safe for discharge home. Pertinent diagnoses were discussed with the patient. Patient was given return precautions    Sabre Leonetti, MD 09/21/18 0600

## 2018-09-21 NOTE — ED Notes (Signed)
Pt heard yelling from nurses station. Pt requesting warm blanket and repeating that "whatever he gave me made it worse." RN notified.

## 2018-09-21 NOTE — ED Triage Notes (Signed)
Pt comes to ed via ems, c/o abdominal pain since Friday, generalized. Pt vomited 2 times today. Tender on palpation.  Pt comes from home, walks, alert x4, v/s on arrival 140/90, pluse 90 , rr18 , cbg 131, spo2 96.   Denies cough.

## 2018-09-21 NOTE — ED Notes (Signed)
Upon entering the room, this RN noted patient was resting quietly. Patient requested ginger ale. Patient given discharge paperwork and this RN explained discharge instructions. Patient verbalizes understanding and signs. However, when Patient, told she was being discharged, she went from being calm and relaxed, to complaining of 10/10 pain. Patient states she is upset she is being sent home, "because my pain's no better." Patient reports she "needs more pain medicine before I go." Patient updated that no more medication has been ordered for her and that her prescriptions have been called in for her to pick up. Patient upset and began requesting ambulance to transfer her home. Patient updated that she did not qualify for ambulance transport, due to her ambulatory status. Patient yells, " I have a broken  Foot! I aint walking with no broken foot!" Patient updated that she was able to ambulate, just slowly, with her broken metatarsal. Patient then states she needs a cab voucher. This RN explained the patient needed to call a ride or she could take a bus, which is free. Patient again yells "I aint taking no bus with a broken foot!" Patient then asks for ginger ale. RN gave patient iced ginger ale and patient stood and ambulated to wheelchair. RN then wheeled patient to the lobby, where patient states she has texted a ride. Patient still requesting pain medication, but denies other needs at this time.

## 2018-09-21 NOTE — ED Notes (Signed)
Pt returned from CT °

## 2018-12-23 ENCOUNTER — Emergency Department (HOSPITAL_COMMUNITY)
Admission: EM | Admit: 2018-12-23 | Discharge: 2018-12-23 | Disposition: A | Discharge status: Home / Self Care | Payer: Medicare Other | Attending: Emergency Medicine | Admitting: Emergency Medicine

## 2018-12-23 ENCOUNTER — Encounter (HOSPITAL_COMMUNITY): Payer: Self-pay

## 2018-12-23 ENCOUNTER — Emergency Department (HOSPITAL_COMMUNITY): Payer: Medicare Other

## 2018-12-23 ENCOUNTER — Other Ambulatory Visit: Payer: Self-pay

## 2018-12-23 DIAGNOSIS — Z79899 Other long term (current) drug therapy: Secondary | ICD-10-CM | POA: Diagnosis not present

## 2018-12-23 DIAGNOSIS — F1210 Cannabis abuse, uncomplicated: Secondary | ICD-10-CM | POA: Insufficient documentation

## 2018-12-23 DIAGNOSIS — M79671 Pain in right foot: Secondary | ICD-10-CM | POA: Insufficient documentation

## 2018-12-23 DIAGNOSIS — M79672 Pain in left foot: Secondary | ICD-10-CM | POA: Insufficient documentation

## 2018-12-23 DIAGNOSIS — F1410 Cocaine abuse, uncomplicated: Secondary | ICD-10-CM | POA: Insufficient documentation

## 2018-12-23 DIAGNOSIS — E119 Type 2 diabetes mellitus without complications: Secondary | ICD-10-CM | POA: Insufficient documentation

## 2018-12-23 DIAGNOSIS — F17210 Nicotine dependence, cigarettes, uncomplicated: Secondary | ICD-10-CM | POA: Insufficient documentation

## 2018-12-23 DIAGNOSIS — E039 Hypothyroidism, unspecified: Secondary | ICD-10-CM | POA: Diagnosis not present

## 2018-12-23 MED ORDER — NAPROXEN 500 MG PO TABS
500.0000 mg | ORAL_TABLET | Freq: Once | ORAL | Status: AC
  Administered 2018-12-23: 500 mg via ORAL
  Filled 2018-12-23: qty 1

## 2018-12-23 MED ORDER — NAPROXEN 375 MG PO TABS: 375.0000 mg | ORAL_TABLET | Freq: Two times a day (BID) | ORAL | 0 refills | Status: AC | PRN
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
## 2018-12-23 NOTE — Discharge Instructions (Addendum)
You were seen today for pain in your both of your feet. Please read and follow all provided instructions.   Xray of left foot report: 1. No acute abnormality. No fracture or broken bones  2. Advanced first metatarsophalangeal joint osteoarthritis.   Xray of right foot: No acute abnormality. No fractures or broken bone   1. Medications: alternate naprosyn and tylenol for pain control, usual home medications. Prescription sent to your pharmacy for Naprosyn. This is an anti-inflammatory medication. Please take this as prescribed for pain and swelling. You should eat food when taking this medication as it can cause upset stomach. Do not take ibuprofen, motrin, or advil with naproxen because it is a similar medication.  2. Treatment: rest, ice, elevate and use ace wraps, drink plenty of fluids, gentle stretching  3. Follow Up: Please followup with the foot doctor as directed or your PCP in 1 week if no improvement for discussion of your diagnoses and further evaluation after today's visit; -I have included information for a foot doctor in your paperwork. His name is Dr. Amalia Hailey and his office is here in [place]. Call his office to schedule follow up appointment.   Please return to the ER for worsening symptoms or other concerns
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
## 2018-12-23 NOTE — ED Notes (Signed)
Patient transported to X-ray 

## 2018-12-25 ENCOUNTER — Ambulatory Visit (INDEPENDENT_AMBULATORY_CARE_PROVIDER_SITE_OTHER): Payer: Medicare Other | Admitting: Podiatry

## 2018-12-25 ENCOUNTER — Encounter: Payer: Self-pay | Admitting: Podiatry

## 2018-12-25 ENCOUNTER — Other Ambulatory Visit: Payer: Self-pay

## 2018-12-25 VITALS — BP 138/85 | HR 74 | Resp 16

## 2018-12-25 DIAGNOSIS — S9030XA Contusion of unspecified foot, initial encounter: Secondary | ICD-10-CM

## 2018-12-25 DIAGNOSIS — S99922A Unspecified injury of left foot, initial encounter: Secondary | ICD-10-CM
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
## 2019-01-22 ENCOUNTER — Ambulatory Visit (INDEPENDENT_AMBULATORY_CARE_PROVIDER_SITE_OTHER): Payer: Medicare Other | Admitting: Podiatry

## 2019-01-22 ENCOUNTER — Encounter: Payer: Self-pay | Admitting: Podiatry

## 2019-01-22 ENCOUNTER — Other Ambulatory Visit: Payer: Self-pay

## 2019-01-22 ENCOUNTER — Ambulatory Visit (INDEPENDENT_AMBULATORY_CARE_PROVIDER_SITE_OTHER): Payer: Medicare Other

## 2019-01-22 DIAGNOSIS — S9030XS Contusion of unspecified foot, sequela: Secondary | ICD-10-CM

## 2019-01-22 DIAGNOSIS — M779 Enthesopathy, unspecified: Secondary | ICD-10-CM

## 2019-01-22 MED ORDER — MELOXICAM 15 MG PO TABS: 15.0000 mg | ORAL_TABLET | Freq: Every day | ORAL | 0 refills | Status: AC

## 2019-01-24 NOTE — Progress Notes (Signed)
Subjective: 58 year old female presents the office today for follow evaluation of bilateral foot pain.  She states that overall she is doing better so she is to follow-up.  She still discomfort she points to submetatarsal area of both feet.  Overall she states that she is doing better.  She is asking for repeat x-rays today. Denies any systemic complaints such as fevers, chills, nausea, vomiting. No acute changes since last appointment, and no other complaints at this time.   Objective: AAO x3, NAD DP/PT pulses palpable bilaterally, CRT less than 3 seconds Decreased range of motion of the first IPJ in the left side worse than right.  There is no [place] area pinpoint tenderness but I cannot try to touch her feet she is very anxious is difficult to evaluate.  Majority of tenderness present in the right face submetatarsal area.  There is no edema no erythema. No pain with calf compression, swelling, warmth, erythema  Assessment: Capsulitis with previous injury  Plan: -All treatment options discussed with the patient including all alternatives, risks, complications.  -Continue with surgical shoe as this is helping.  Left foot has been doing well so therefore she can wear the surgical shoe on the right side.  Prescribed meloxicam discussed side effects of medication. -Patient encouraged to call the office with any questions, concerns, change in symptoms.   Return in about 6 weeks (around 03/05/2019).  Trula Slade DPM

## 2019-03-08 ENCOUNTER — Ambulatory Visit: Payer: Medicare Other | Admitting: Podiatry

## 2019-03-12 ENCOUNTER — Ambulatory Visit: Payer: Medicare Other | Admitting: Podiatry

## 2019-05-05 LAB — HM PAP SMEAR: HM Pap smear: NORMAL

## 2020-01-31 ENCOUNTER — Ambulatory Visit: Payer: Medicare Other | Admitting: Internal Medicine

## 2020-02-09 ENCOUNTER — Ambulatory Visit: Payer: Medicare Other | Admitting: Internal Medicine

## 2020-02-23 ENCOUNTER — Ambulatory Visit (INDEPENDENT_AMBULATORY_CARE_PROVIDER_SITE_OTHER): Payer: Medicare Other | Admitting: Internal Medicine

## 2020-02-23 ENCOUNTER — Encounter: Payer: Self-pay | Admitting: Internal Medicine

## 2020-02-23 ENCOUNTER — Other Ambulatory Visit: Payer: Self-pay

## 2020-02-23 VITALS — BP 170/102 | HR 83 | Temp 98.3°F | Resp 16 | Ht 68.0 in | Wt 186.0 lb

## 2020-02-23 DIAGNOSIS — D539 Nutritional anemia, unspecified: Secondary | ICD-10-CM

## 2020-02-23 DIAGNOSIS — E785 Hyperlipidemia, unspecified: Secondary | ICD-10-CM | POA: Diagnosis not present

## 2020-02-23 DIAGNOSIS — K219 Gastro-esophageal reflux disease without esophagitis: Secondary | ICD-10-CM

## 2020-02-23 DIAGNOSIS — M791 Myalgia, unspecified site: Secondary | ICD-10-CM

## 2020-02-23 DIAGNOSIS — E119 Type 2 diabetes mellitus without complications: Secondary | ICD-10-CM | POA: Diagnosis not present

## 2020-02-23 DIAGNOSIS — E039 Hypothyroidism, unspecified: Secondary | ICD-10-CM

## 2020-02-23 DIAGNOSIS — F1410 Cocaine abuse, uncomplicated: Secondary | ICD-10-CM

## 2020-02-23 DIAGNOSIS — F333 Major depressive disorder, recurrent, severe with psychotic symptoms: Secondary | ICD-10-CM

## 2020-02-23 DIAGNOSIS — Z23 Encounter for immunization: Secondary | ICD-10-CM | POA: Diagnosis not present

## 2020-02-23 DIAGNOSIS — I10 Essential (primary) hypertension: Secondary | ICD-10-CM

## 2020-02-23 DIAGNOSIS — H409 Unspecified glaucoma: Secondary | ICD-10-CM

## 2020-02-23 DIAGNOSIS — N1831 Chronic kidney disease, stage 3a: Secondary | ICD-10-CM

## 2020-02-23 LAB — MICROALBUMIN / CREATININE URINE RATIO
Creatinine,U: 79.3 mg/dL
Microalb Creat Ratio: 1.9 mg/g (ref 0.0–30.0)
Microalb, Ur: 1.5 mg/dL (ref 0.0–1.9)

## 2020-02-23 LAB — POCT GLYCOSYLATED HEMOGLOBIN (HGB A1C): Hemoglobin A1C: 6.1 % — AB (ref 4.0–5.6)

## 2020-02-23 LAB — POCT URINALYSIS DIPSTICK
Bilirubin, UA: NEGATIVE
Blood, UA: NEGATIVE
Glucose, UA: NEGATIVE
Ketones, UA: NEGATIVE
Leukocytes, UA: NEGATIVE
Nitrite, UA: NEGATIVE
Protein, UA: NEGATIVE
Spec Grav, UA: 1.025 (ref 1.010–1.025)
Urobilinogen, UA: 0.2 E.U./dL
pH, UA: 6 (ref 5.0–8.0)

## 2020-02-23 LAB — BASIC METABOLIC PANEL
BUN: 22 mg/dL (ref 6–23)
CO2: 29 mEq/L (ref 19–32)
Calcium: 9.9 mg/dL (ref 8.4–10.5)
Chloride: 103 mEq/L (ref 96–112)
Creatinine, Ser: 1.4 mg/dL — ABNORMAL HIGH (ref 0.40–1.20)
GFR: 41.31 mL/min — ABNORMAL LOW (ref >60.00)
Glucose, Bld: 88 mg/dL (ref 70–99)
Potassium: 4.2 mEq/L (ref 3.5–5.1)
Sodium: 139 mEq/L (ref 135–145)

## 2020-02-23 LAB — HEPATIC FUNCTION PANEL
ALT: 23 U/L (ref 0–35)
AST: 68 U/L — ABNORMAL HIGH (ref 0–37)
Albumin: 4.7 g/dL (ref 3.5–5.2)
Alkaline Phosphatase: 43 U/L (ref 39–117)
Bilirubin, Direct: 0.1 mg/dL (ref 0.0–0.3)
Total Bilirubin: 0.3 mg/dL (ref 0.2–1.2)
Total Protein: 7.6 g/dL (ref 6.0–8.3)

## 2020-02-23 LAB — CBC WITH DIFFERENTIAL/PLATELET
Basophils Absolute: 0.1 10*3/uL (ref 0.0–0.1)
Basophils Relative: 1.8 % (ref 0.0–3.0)
Eosinophils Absolute: 0.2 10*3/uL (ref 0.0–0.7)
Eosinophils Relative: 3.5 % (ref 0.0–5.0)
HCT: 35.4 % — ABNORMAL LOW (ref 36.0–46.0)
Hemoglobin: 11.9 g/dL — ABNORMAL LOW (ref 12.0–15.0)
Lymphocytes Relative: 40.3 % (ref 12.0–46.0)
Lymphs Abs: 2.2 10*3/uL (ref 0.7–4.0)
MCHC: 33.5 g/dL (ref 30.0–36.0)
MCV: 95.1 fl (ref 78.0–100.0)
Monocytes Absolute: 0.2 10*3/uL (ref 0.1–1.0)
Monocytes Relative: 3.9 % (ref 3.0–12.0)
Neutro Abs: 2.8 10*3/uL (ref 1.4–7.7)
Neutrophils Relative %: 50.5 % (ref 43.0–77.0)
Platelets: 209 10*3/uL (ref 150.0–400.0)
RBC: 3.72 Mil/uL — ABNORMAL LOW (ref 3.87–5.11)
RDW: 13.6 % (ref 11.5–15.5)
WBC: 5.5 10*3/uL (ref 4.0–10.5)

## 2020-02-23 LAB — TSH: TSH: 145.59 u[IU]/mL — ABNORMAL HIGH (ref 0.35–4.50)

## 2020-02-23 LAB — LIPID PANEL
Cholesterol: 379 mg/dL — ABNORMAL HIGH (ref 0–200)
HDL: 86.5 mg/dL (ref >39.00)
LDL Cholesterol: 265 mg/dL — ABNORMAL HIGH (ref 0–99)
NonHDL: 292.24
Total CHOL/HDL Ratio: 4
Triglycerides: 136 mg/dL (ref 0.0–149.0)
VLDL: 27.2 mg/dL (ref 0.0–40.0)

## 2020-02-23 MED ORDER — LEVOTHYROXINE SODIUM 200 MCG PO TABS: 200.0000 ug | ORAL_TABLET | Freq: Every day | ORAL | 1 refills | Status: DC

## 2020-02-23 NOTE — Patient Instructions (Signed)
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
## 2020-02-24 ENCOUNTER — Other Ambulatory Visit (INDEPENDENT_AMBULATORY_CARE_PROVIDER_SITE_OTHER): Payer: Medicare Other

## 2020-02-24 DIAGNOSIS — D539 Nutritional anemia, unspecified: Secondary | ICD-10-CM | POA: Insufficient documentation

## 2020-02-24 DIAGNOSIS — M791 Myalgia, unspecified site: Secondary | ICD-10-CM | POA: Diagnosis not present

## 2020-02-24 DIAGNOSIS — F333 Major depressive disorder, recurrent, severe with psychotic symptoms: Secondary | ICD-10-CM | POA: Insufficient documentation

## 2020-02-24 LAB — CK: Total CK: 2535 U/L — ABNORMAL HIGH (ref 7–177)

## 2020-02-24 MED ORDER — AMLODIPINE BESYLATE 5 MG PO TABS: 5.0000 mg | ORAL_TABLET | Freq: Every day | ORAL | 0 refills | Status: DC

## 2020-02-24 MED ORDER — DOXAZOSIN MESYLATE 1 MG PO TABS: 1.0000 mg | ORAL_TABLET | Freq: Two times a day (BID) | ORAL | 0 refills | Status: DC

## 2020-02-24 MED ORDER — INDAPAMIDE 1.25 MG PO TABS: 1.2500 mg | ORAL_TABLET | Freq: Every day | ORAL | 0 refills | Status: DC

## 2020-02-24 NOTE — Addendum Note (Signed)
Addended by: Trenda Moots on: 40/98/1191 12:14 PM   Modules accepted: Orders

## 2020-02-28 ENCOUNTER — Telehealth: Payer: Self-pay

## 2020-02-28 ENCOUNTER — Telehealth: Payer: Self-pay | Admitting: Internal Medicine

## 2020-02-28 NOTE — Telephone Encounter (Signed)
[facility]    Caller states they were seen for their pain the other day in the office. The doctor was supposed to send in Percocet for their pain. She has chronic pain in legs, thighs, and hips. She is having walking difficulty.   [facility] advised: See HCP within 4 Hours (or PCP triage)  Patient understood but disagreed.   Please advise.

## 2020-02-28 NOTE — Telephone Encounter (Signed)
Pt was informed and understood that PCP was waiting on UDS results prior to Rx on controlled meds at the end of her established care visit.

## 2020-02-28 NOTE — Telephone Encounter (Signed)
Error

## 2020-02-29 ENCOUNTER — Telehealth: Payer: Self-pay | Admitting: Internal Medicine

## 2020-02-29 NOTE — Telephone Encounter (Signed)
Called pt, LVM.   

## 2020-02-29 NOTE — Telephone Encounter (Signed)
[facility] has preliminary result of UDS that they will fax to our office.

## 2020-02-29 NOTE — Telephone Encounter (Signed)
Patient is requesting a call back. She called and was hard to understand do to crying. She can be reached at [phone].

## 2020-03-01 NOTE — Telephone Encounter (Signed)
Noted  

## 2020-03-01 NOTE — Telephone Encounter (Signed)
Patient calling (third time today) re: lab results and medication.  Relayed to the patient that we are still awaiting results and would follow-up.  Patient disconnected the call

## 2020-03-06 NOTE — Telephone Encounter (Signed)
[facility] states that the lab is working on further confirmation testing & it's taking longer than usual; may be another several days.  States that if anything comes back positive that there is typically more screening that is to be done.  Unable to release a preliminary report for UDS.

## 2020-03-07 NOTE — Telephone Encounter (Signed)
Patient calling screaming and crying upset because she is in pain. Doesn't understand why medication cannot be sent in.  [phone]

## 2020-03-08 NOTE — Telephone Encounter (Signed)
Patient calling again extremely upset demanding to speak with someone, explained that we are still waiting on results but she wants someone to call her. [phone]

## 2020-03-09 NOTE — Telephone Encounter (Signed)
UDS still pending per rep.

## 2020-03-10 ENCOUNTER — Telehealth: Payer: Self-pay | Admitting: Internal Medicine

## 2020-03-10 DIAGNOSIS — M791 Myalgia, unspecified site: Secondary | ICD-10-CM

## 2020-03-10 DIAGNOSIS — N3944 Nocturnal enuresis: Secondary | ICD-10-CM

## 2020-03-10 DIAGNOSIS — M1712 Unilateral primary osteoarthritis, left knee: Secondary | ICD-10-CM

## 2020-03-10 DIAGNOSIS — N1831 Chronic kidney disease, stage 3a: Secondary | ICD-10-CM

## 2020-03-10 NOTE — Telephone Encounter (Signed)
Does she need HHC?

## 2020-03-10 NOTE — Telephone Encounter (Signed)
    Patient requesting order for bedside commode, overnight diapers, and order for shower rail/chair.   Please call

## 2020-03-13 NOTE — Telephone Encounter (Signed)
Called pt, LVM.   

## 2020-03-14 LAB — COCAINE CONF, UR
Benzoylecgonine GC/MS Conf: 686 ng/mL
Cocaine Metab Quant, Ur: POSITIVE — AB

## 2020-03-14 LAB — URINE DRUGS OF ABUSE SCREEN W ALC, ROUTINE (REF LAB)
Amphetamines, Urine: NEGATIVE ng/mL
Barbiturate Quant, Ur: NEGATIVE ng/mL
Benzodiazepine Quant, Ur: NEGATIVE ng/mL
Cannabinoid Quant, Ur: NEGATIVE ng/mL
Ethanol, Urine: NEGATIVE %
Methadone Screen, Urine: NEGATIVE ng/mL
Opiate Quant, Ur: NEGATIVE ng/mL
PCP Quant, Ur: NEGATIVE ng/mL
Propoxyphene: NEGATIVE ng/mL

## 2020-03-16 NOTE — Telephone Encounter (Signed)
Patient calling stating she is still in a lot of pain, would like a return call

## 2020-03-21 NOTE — Telephone Encounter (Signed)
Patient still in pain would like to know what Dr. Ronnald Ramp will do about it.  Please call the patient to follow-up

## 2020-03-22 NOTE — Telephone Encounter (Signed)
Message left for patient today 

## 2020-03-22 NOTE — Telephone Encounter (Signed)
See below

## 2020-03-22 NOTE — Telephone Encounter (Signed)
° ° °  Please return call to patient °

## 2020-03-22 NOTE — Telephone Encounter (Signed)
Her pain is the result of not taking the thyroid supplement

## 2020-03-23 NOTE — Telephone Encounter (Signed)
    Patient requesting to use [employer] for supplies

## 2020-03-23 NOTE — Telephone Encounter (Addendum)
LVM for pt to call back.  Need to know the DME company to send the order to.

## 2020-03-24 ENCOUNTER — Encounter: Payer: Self-pay | Admitting: Internal Medicine

## 2020-03-24 NOTE — Telephone Encounter (Signed)
Spoke to patient today. I had called for information on the DME.   Pt was asking about the UDS and an rx for controlled pain medication.   Pt informed that PCP would NOT rx controlled substances due to the positive cocaine in her UDS results.   Pt was very upset and began to speak louder and became in audible. Then patient hung up the phone.

## 2020-03-24 NOTE — Addendum Note (Signed)
Addended by: Karle Barr on: 03/24/2020 03:52 PM   Modules accepted: Orders

## 2020-03-24 NOTE — Telephone Encounter (Addendum)
LVM for pt will all of the information below:  -summary - Pt needs to let us know where to send the bedside commode and the shower bench.  Shower bench is out of pocket cost not covered by [employer]. DME company options is [employer].  [employer] needs pt to call them and set up an account.    Orders are pended for provider electronic signature, to be printed, signed and faxed.   ------------------------------------------------------------------------------------------------------------------------------------  [employer] - Phone: [phone]. Fax: [phone]  ext 4 and then 1   -   for urinary supplies representative.   Spoke to a representative and they stated that the patient needs to call to set up an account with [employer]. They will send [place] a fax and instructions on how to order.   It does not look like [employer] supplies the items for the bedside commode or the shower bench.  The order for the shower bench and the bedside commode will have to go to another DME company.   Options in the area is [place]. I know [employer] will deliver DME and they also have Epic access.

## 2020-03-24 NOTE — Telephone Encounter (Signed)
Patient returning call wondering who called and before I could let her know the message she hung up

## 2020-03-27 NOTE — Telephone Encounter (Signed)
Patient calling requesting a call back about her supplies and she needs some documentation.

## 2020-03-28 NOTE — Telephone Encounter (Signed)
Pt contacted and stated that [employer] has been contacted and they are sending in the forms.  If we do not have the forms from [employer] can we send the request to [employer] also. I have pended this order as well.   Pt stated that the standard shower chair and the elevated toilet seat would be fine.     Per [employer] - Elevated toilet seat is private pay- insurance will not cover them- $45.00    However patient has Medicaid and they may cover the cost of this.   Both orders are pended. Please print and have Dr. Ronnald Ramp sign. Send a community message to [employer], Skeet Latch and Jonn Shingles with a message and patient name and dob. They can see the orders in Epic.    Pt requested a letter to her apartment complex to move her the ground level. That letter is pended for review.

## 2020-03-28 NOTE — Addendum Note (Signed)
Addended by: Aviva Signs M on: 03/28/2020 11:52 AM   Modules accepted: Orders

## 2020-03-29 NOTE — Telephone Encounter (Signed)
Orders have been printed and placed on PCPs desk to be signed.

## 2020-03-29 NOTE — Addendum Note (Signed)
Addended by: Hinda Kehr on: 03/29/2020 01:26 PM   Modules accepted: Orders

## 2020-03-30 NOTE — Telephone Encounter (Signed)
I have received the signed orders back from PCP and send a community message to the 3 individuals listed below.

## 2020-03-31 NOTE — Telephone Encounter (Signed)
Called pt, LVM.   

## 2020-03-31 NOTE — Telephone Encounter (Signed)
Patient called and is requesting a call about a form. It was hard to understand what the form was for. She can be reached at [phone].

## 2020-04-05 ENCOUNTER — Encounter: Payer: Self-pay | Admitting: Internal Medicine

## 2020-04-05 ENCOUNTER — Ambulatory Visit (INDEPENDENT_AMBULATORY_CARE_PROVIDER_SITE_OTHER): Payer: Medicare Other | Admitting: Internal Medicine

## 2020-04-05 ENCOUNTER — Other Ambulatory Visit: Payer: Self-pay

## 2020-04-05 VITALS — BP 142/82 | HR 63 | Temp 98.3°F | Ht 68.0 in | Wt 162.0 lb

## 2020-04-05 DIAGNOSIS — N1831 Chronic kidney disease, stage 3a: Secondary | ICD-10-CM

## 2020-04-05 DIAGNOSIS — E785 Hyperlipidemia, unspecified: Secondary | ICD-10-CM

## 2020-04-05 DIAGNOSIS — I10 Essential (primary) hypertension: Secondary | ICD-10-CM

## 2020-04-05 DIAGNOSIS — G737 Myopathy in diseases classified elsewhere: Secondary | ICD-10-CM | POA: Diagnosis not present

## 2020-04-05 DIAGNOSIS — R7989 Other specified abnormal findings of blood chemistry: Secondary | ICD-10-CM | POA: Insufficient documentation

## 2020-04-05 DIAGNOSIS — T502X5A Adverse effect of carbonic-anhydrase inhibitors, benzothiadiazides and other diuretics, initial encounter: Secondary | ICD-10-CM | POA: Insufficient documentation

## 2020-04-05 DIAGNOSIS — D539 Nutritional anemia, unspecified: Secondary | ICD-10-CM

## 2020-04-05 DIAGNOSIS — E039 Hypothyroidism, unspecified: Secondary | ICD-10-CM | POA: Insufficient documentation

## 2020-04-05 DIAGNOSIS — E876 Hypokalemia: Secondary | ICD-10-CM

## 2020-04-05 DIAGNOSIS — F333 Major depressive disorder, recurrent, severe with psychotic symptoms: Secondary | ICD-10-CM

## 2020-04-05 LAB — CBC WITH DIFFERENTIAL/PLATELET
Basophils Absolute: 0 10*3/uL (ref 0.0–0.1)
Basophils Relative: 0.7 % (ref 0.0–3.0)
Eosinophils Absolute: 0.1 10*3/uL (ref 0.0–0.7)
Eosinophils Relative: 1.3 % (ref 0.0–5.0)
HCT: 32.9 % — ABNORMAL LOW (ref 36.0–46.0)
Hemoglobin: 11.5 g/dL — ABNORMAL LOW (ref 12.0–15.0)
Lymphocytes Relative: 25.8 % (ref 12.0–46.0)
Lymphs Abs: 1.8 10*3/uL (ref 0.7–4.0)
MCHC: 34.9 g/dL (ref 30.0–36.0)
MCV: 91.9 fl (ref 78.0–100.0)
Monocytes Absolute: 0.5 10*3/uL (ref 0.1–1.0)
Monocytes Relative: 7.4 % (ref 3.0–12.0)
Neutro Abs: 4.5 10*3/uL (ref 1.4–7.7)
Neutrophils Relative %: 64.8 % (ref 43.0–77.0)
Platelets: 305 10*3/uL (ref 150.0–400.0)
RBC: 3.58 Mil/uL — ABNORMAL LOW (ref 3.87–5.11)
RDW: 13 % (ref 11.5–15.5)
WBC: 6.9 10*3/uL (ref 4.0–10.5)

## 2020-04-05 LAB — FOLATE: Folate: 9 ng/mL (ref >5.9)

## 2020-04-05 LAB — CK: Total CK: 444 U/L — ABNORMAL HIGH (ref 7–177)

## 2020-04-05 LAB — HEPATIC FUNCTION PANEL
ALT: 10 U/L (ref 0–35)
AST: 24 U/L (ref 0–37)
Albumin: 4.4 g/dL (ref 3.5–5.2)
Alkaline Phosphatase: 75 U/L (ref 39–117)
Bilirubin, Direct: 0 mg/dL (ref 0.0–0.3)
Total Bilirubin: 0.4 mg/dL (ref 0.2–1.2)
Total Protein: 7.6 g/dL (ref 6.0–8.3)

## 2020-04-05 LAB — VITAMIN B12: Vitamin B-12: 255 pg/mL (ref 211–911)

## 2020-04-05 LAB — BASIC METABOLIC PANEL
BUN: 32 mg/dL — ABNORMAL HIGH (ref 6–23)
CO2: 34 mEq/L — ABNORMAL HIGH (ref 19–32)
Calcium: 10.2 mg/dL (ref 8.4–10.5)
Chloride: 95 mEq/L — ABNORMAL LOW (ref 96–112)
Creatinine, Ser: 1.28 mg/dL — ABNORMAL HIGH (ref 0.40–1.20)
GFR: 45.96 mL/min — ABNORMAL LOW (ref >60.00)
Glucose, Bld: 84 mg/dL (ref 70–99)
Potassium: 3.1 mEq/L — ABNORMAL LOW (ref 3.5–5.1)
Sodium: 136 mEq/L (ref 135–145)

## 2020-04-05 LAB — FERRITIN: Ferritin: 73.7 ng/mL (ref 10.0–291.0)

## 2020-04-05 LAB — TSH: TSH: 1.07 u[IU]/mL (ref 0.35–4.50)

## 2020-04-05 LAB — IRON: Iron: 84 ug/dL (ref 42–145)

## 2020-04-05 MED ORDER — TRAZODONE HCL 50 MG PO TABS: 50.0000 mg | ORAL_TABLET | Freq: Every day | ORAL | 0 refills | Status: DC

## 2020-04-05 MED ORDER — ARIPIPRAZOLE 2 MG PO TABS: 2.0000 mg | ORAL_TABLET | Freq: Every day | ORAL | 0 refills | Status: DC

## 2020-04-05 MED ORDER — POTASSIUM CHLORIDE CRYS ER 20 MEQ PO TBCR: 20.0000 meq | EXTENDED_RELEASE_TABLET | Freq: Two times a day (BID) | ORAL | 0 refills | Status: DC

## 2020-04-05 NOTE — Patient Instructions (Signed)
Hypothyroidism  Hypothyroidism is when the thyroid gland does not make enough of certain hormones (it is underactive). The thyroid gland is a small gland located in the lower front part of the neck, just in front of the windpipe (trachea). This gland makes hormones that help control how the body uses food for energy (metabolism) as well as how the heart and brain function. These hormones also play a role in keeping your bones strong. When the thyroid is underactive, it produces too little of the hormones thyroxine (T4) and triiodothyronine (T3). What are the causes? This condition may be caused by:  Hashimoto's disease. This is a disease in which the body's disease-fighting system (immune system) attacks the thyroid gland. This is the most common cause.  Viral infections.  Pregnancy.  Certain medicines.  Birth defects.  Past radiation treatments to the head or neck for cancer.  Past treatment with radioactive iodine.  Past exposure to radiation in the environment.  Past surgical removal of part or all of the thyroid.  Problems with a gland in the center of the brain (pituitary gland).  Lack of enough iodine in the diet. What increases the risk? You are more likely to develop this condition if:  You are female.  You have a family history of thyroid conditions.  You use a medicine called lithium.  You take medicines that affect the immune system (immunosuppressants). What are the signs or symptoms? Symptoms of this condition include:  Feeling as though you have no energy (lethargy).  Not being able to tolerate cold.  Weight gain that is not explained by a change in diet or exercise habits.  Lack of appetite.  Dry skin.  Coarse hair.  Menstrual irregularity.  Slowing of thought processes.  Constipation.  Sadness or depression. How is this diagnosed? This condition may be diagnosed based on:  Your symptoms, your medical history, and a physical exam.  Blood  tests. You may also have imaging tests, such as an ultrasound or MRI. How is this treated? This condition is treated with medicine that replaces the thyroid hormones that your body does not make. After you begin treatment, it may take several weeks for symptoms to go away. Follow these instructions at home:  Take over-the-counter and prescription medicines only as told by your health care provider.  If you start taking any new medicines, tell your health care provider.  Keep all follow-up visits as told by your health care provider. This is important. ? As your condition improves, your dosage of thyroid hormone medicine may change. ? You will need to have blood tests regularly so that your health care provider can monitor your condition. Contact a health care provider if:  Your symptoms do not get better with treatment.  You are taking thyroid hormone replacement medicine and you: ? Sweat a lot. ? Have tremors. ? Feel anxious. ? Lose weight rapidly. ? Cannot tolerate heat. ? Have emotional swings. ? Have diarrhea. ? Feel weak. Get help right away if you have:  Chest pain.  An irregular heartbeat.  A rapid heartbeat.  Difficulty breathing. Summary  Hypothyroidism is when the thyroid gland does not make enough of certain hormones (it is underactive).  When the thyroid is underactive, it produces too little of the hormones thyroxine (T4) and triiodothyronine (T3).  The most common cause is Hashimoto's disease, a disease in which the body's disease-fighting system (immune system) attacks the thyroid gland. The condition can also be caused by viral infections, medicine, pregnancy, or   past radiation treatment to the head or neck.  Symptoms may include weight gain, dry skin, constipation, feeling as though you do not have energy, and not being able to tolerate cold.  This condition is treated with medicine to replace the thyroid hormones that your body does not make. This  information is not intended to replace advice given to you by your health care provider. Make sure you discuss any questions you have with your health care provider. Document Revised: 11/19/2019 Document Reviewed: 11/04/2019 Elsevier Patient Education  2021 [employer]  
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
## 2020-04-10 ENCOUNTER — Other Ambulatory Visit: Payer: Self-pay | Admitting: Internal Medicine

## 2020-04-10 DIAGNOSIS — E5111 Dry beriberi: Secondary | ICD-10-CM | POA: Insufficient documentation

## 2020-04-10 LAB — HEPATITIS B CORE ANTIBODY, TOTAL: Hep B Core Total Ab: NONREACTIVE

## 2020-04-10 LAB — HEPATITIS C ANTIBODY
Hepatitis C Ab: NONREACTIVE
SIGNAL TO CUT-OFF: 0.02 (ref <1.00)

## 2020-04-10 LAB — HEPATITIS A ANTIBODY, TOTAL: Hepatitis A AB,Total: NONREACTIVE

## 2020-04-10 LAB — HEPATITIS B SURFACE ANTIGEN: Hepatitis B Surface Ag: NONREACTIVE

## 2020-04-10 LAB — HEPATITIS B SURFACE ANTIBODY, QUANTITATIVE: Hep B S AB Quant (Post): <5 m[IU]/mL — ABNORMAL LOW (ref >=10)

## 2020-04-10 LAB — VITAMIN B1: Vitamin B1 (Thiamine): 6 nmol/L — ABNORMAL LOW (ref 8–30)

## 2020-04-10 MED ORDER — THIAMINE HCL 100 MG PO TABS: 100.0000 mg | ORAL_TABLET | Freq: Every day | ORAL | 0 refills | Status: DC

## 2020-04-24 ENCOUNTER — Telehealth: Payer: Self-pay | Admitting: Internal Medicine

## 2020-04-24 NOTE — Telephone Encounter (Signed)
   Patient calling to apologize for her behavior during last office visit. She is requesting call back to discuss lab results.

## 2020-04-26 NOTE — Telephone Encounter (Signed)
Pt has been informed of lab results and expressed understanding. She is coming in next week for a nurse visit for hep a and hep b vaccines.   She would like to know of PCP could provider her with a letter to give her apt complex to be able to move to a lower floor. They will not allow her to do so without documentation. Please advise.

## 2020-05-01 ENCOUNTER — Encounter: Payer: Self-pay | Admitting: Internal Medicine

## 2020-05-01 NOTE — Telephone Encounter (Signed)
Pt stated she would pick up letter during her nurse visit on Wednesday.

## 2020-05-01 NOTE — Telephone Encounter (Signed)
Letter written

## 2020-05-03 ENCOUNTER — Ambulatory Visit (INDEPENDENT_AMBULATORY_CARE_PROVIDER_SITE_OTHER): Payer: Medicare Other

## 2020-05-03 ENCOUNTER — Other Ambulatory Visit: Payer: Self-pay

## 2020-05-03 DIAGNOSIS — Z23 Encounter for immunization: Secondary | ICD-10-CM

## 2020-05-25 ENCOUNTER — Telehealth: Payer: Self-pay | Admitting: Internal Medicine

## 2020-05-25 NOTE — Telephone Encounter (Signed)
[employer] calling to requesting additional diagnosis codes for labs drawn Mar 17, 2023. The insurance has denied  Please  call [phone] Ref# [id number]

## 2020-06-02 ENCOUNTER — Other Ambulatory Visit: Payer: Self-pay | Admitting: Internal Medicine

## 2020-06-02 DIAGNOSIS — I10 Essential (primary) hypertension: Secondary | ICD-10-CM

## 2020-06-06 ENCOUNTER — Other Ambulatory Visit: Payer: Self-pay | Admitting: Internal Medicine

## 2020-06-06 DIAGNOSIS — F333 Major depressive disorder, recurrent, severe with psychotic symptoms: Secondary | ICD-10-CM

## 2020-06-29 NOTE — Telephone Encounter (Signed)
Dx codes for DOS 02/22/21 given.  Insurance will be re-filed by [employer].

## 2020-07-05 ENCOUNTER — Ambulatory Visit: Payer: Medicare Other

## 2020-07-13 ENCOUNTER — Ambulatory Visit (INDEPENDENT_AMBULATORY_CARE_PROVIDER_SITE_OTHER): Payer: Medicare Other

## 2020-07-13 ENCOUNTER — Other Ambulatory Visit: Payer: Self-pay

## 2020-07-13 DIAGNOSIS — Z23 Encounter for immunization: Secondary | ICD-10-CM | POA: Diagnosis not present

## 2020-07-27 DIAGNOSIS — H401131 Primary open-angle glaucoma, bilateral, mild stage: Secondary | ICD-10-CM | POA: Diagnosis not present

## 2020-08-05 ENCOUNTER — Other Ambulatory Visit: Payer: Self-pay | Admitting: Internal Medicine

## 2020-08-05 DIAGNOSIS — T502X5A Adverse effect of carbonic-anhydrase inhibitors, benzothiadiazides and other diuretics, initial encounter: Secondary | ICD-10-CM

## 2020-08-05 DIAGNOSIS — E876 Hypokalemia: Secondary | ICD-10-CM

## 2020-10-31 ENCOUNTER — Other Ambulatory Visit: Payer: Self-pay | Admitting: Internal Medicine

## 2020-10-31 DIAGNOSIS — F333 Major depressive disorder, recurrent, severe with psychotic symptoms: Secondary | ICD-10-CM

## 2020-10-31 DIAGNOSIS — I10 Essential (primary) hypertension: Secondary | ICD-10-CM

## 2020-10-31 DIAGNOSIS — T502X5A Adverse effect of carbonic-anhydrase inhibitors, benzothiadiazides and other diuretics, initial encounter: Secondary | ICD-10-CM

## 2020-10-31 DIAGNOSIS — E876 Hypokalemia: Secondary | ICD-10-CM

## 2020-11-13 ENCOUNTER — Ambulatory Visit: Payer: Medicare Other

## 2020-11-29 ENCOUNTER — Emergency Department (HOSPITAL_COMMUNITY)
Admission: EM | Admit: 2020-11-29 | Discharge: 2020-11-29 | Disposition: A | Discharge status: Home / Self Care | Payer: 59 | Attending: Emergency Medicine | Admitting: Emergency Medicine

## 2020-11-29 ENCOUNTER — Emergency Department (HOSPITAL_COMMUNITY): Payer: 59

## 2020-11-29 ENCOUNTER — Encounter (HOSPITAL_COMMUNITY): Payer: Self-pay

## 2020-11-29 DIAGNOSIS — I129 Hypertensive chronic kidney disease with stage 1 through stage 4 chronic kidney disease, or unspecified chronic kidney disease: Secondary | ICD-10-CM | POA: Diagnosis not present

## 2020-11-29 DIAGNOSIS — N1831 Chronic kidney disease, stage 3a: Secondary | ICD-10-CM | POA: Diagnosis not present

## 2020-11-29 DIAGNOSIS — F17210 Nicotine dependence, cigarettes, uncomplicated: Secondary | ICD-10-CM | POA: Insufficient documentation

## 2020-11-29 DIAGNOSIS — E039 Hypothyroidism, unspecified: Secondary | ICD-10-CM | POA: Diagnosis not present

## 2020-11-29 DIAGNOSIS — E1122 Type 2 diabetes mellitus with diabetic chronic kidney disease: Secondary | ICD-10-CM | POA: Insufficient documentation

## 2020-11-29 DIAGNOSIS — Z79899 Other long term (current) drug therapy: Secondary | ICD-10-CM | POA: Insufficient documentation

## 2020-11-29 DIAGNOSIS — M545 Low back pain, unspecified: Secondary | ICD-10-CM | POA: Insufficient documentation

## 2020-11-29 MED ORDER — TRAMADOL HCL 50 MG PO TABS
50.0000 mg | ORAL_TABLET | Freq: Once | ORAL | Status: AC
Start: 2020-11-29 — End: 2020-11-29
  Administered 2020-11-29: 50 mg via ORAL
  Filled 2020-11-29: qty 1

## 2020-11-29 MED ORDER — IBUPROFEN 200 MG PO TABS
600.0000 mg | ORAL_TABLET | Freq: Once | ORAL | Status: AC
  Administered 2020-11-29: 600 mg via ORAL
  Filled 2020-11-29: qty 3

## 2020-11-29 MED ORDER — TIZANIDINE HCL 4 MG PO TABS: 4.0000 mg | ORAL_TABLET | Freq: Four times a day (QID) | ORAL | 0 refills | Status: DC | PRN

## 2020-11-29 MED ORDER — NAPROXEN 500 MG PO TABS: 500.0000 mg | ORAL_TABLET | Freq: Two times a day (BID) | ORAL | 0 refills | Status: AC

## 2020-11-29 MED ORDER — OXYCODONE-ACETAMINOPHEN 5-325 MG PO TABS: 1.0000 | ORAL_TABLET | Freq: Four times a day (QID) | ORAL | 0 refills | Status: DC | PRN

## 2020-11-29 MED ORDER — ACETAMINOPHEN 325 MG PO TABS
650.0000 mg | ORAL_TABLET | Freq: Once | ORAL | Status: AC
  Administered 2020-11-29: 650 mg via ORAL
  Filled 2020-11-29: qty 2

## 2020-11-29 NOTE — ED Provider Notes (Signed)
[facility] [facility] DEPT Provider Note   CSN: [id number] Arrival date & time: 11/29/20  1145     History Chief Complaint  Patient presents with   Back Pain    Raven [name] is a 60 y.o. female.  Patient presents with sharp low back pain ongoing for 2 to 3 weeks.  She states it started when she was trying to lift a friend off the ground as she was pulling to stand up she felt sharp spasming pain in lower back.  However this pain is not improved and has been persistent for the past 2 to 3 weeks.  Denies any new numbness or weakness.  No fever no cough no vomiting or diarrhea.  No bowel or bladder dysfunction per patient.      Past Medical History:  Diagnosis Date   Anxiety    Arthritis    all joints and thumbs   Bladder tumor    Glaucoma of both eyes    History of drug abuse in remission ([place])    hx crack use--  recovery since 2003   Hyperlipidemia    OSA (obstructive sleep apnea)    per pt study 2008  no cpap   Post-surgical hypothyroidism    Severe recurrent major depression with psychotic features [facility])    followed by psychologist--  dr Berneta Sages plovsky   Type 2 diabetes mellitus ([place])    Wears dentures    upper   Wears glasses     Patient Active Problem List   Diagnosis Date Noted   Thiamine deficiency neuropathy 04/10/2020   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   Elevated LFTs 04/05/2020   Diuretic-induced hypokalemia 04/05/2020   Deficiency anemia 02/24/2020   Severe episode of recurrent major depressive disorder, with psychotic features ([place]) 02/24/2020   Hyperlipidemia with target LDL less than 130 02/23/2020   Primary hypertension 02/23/2020   Cocaine abuse ([place]) 02/23/2020   Glaucoma of both eyes 02/23/2020   Stage 3a chronic kidney disease ([place]) 02/23/2020   Primary osteoarthritis of left knee 06/10/2016   Gastroesophageal reflux disease without esophagitis 12/15/2015   Urothelial carcinoma ([place]) 05/04/2015   Shoulder arthritis  04/17/2015   Severe recurrent major depressive disorder with psychotic features ([place]) 10/13/2014   Primary insomnia 08/17/2014   Anxiety and depression 05/13/2014   Open-angle glaucoma 05/13/2014   Diabetes mellitus, type 2 ([place]) 03/03/2014   Hypothyroidism 03/03/2014    Past Surgical History:  Procedure Laterality Date   COLONOSCOPY  Nov 2016   STRABISMUS SURGERY Bilateral age 24   THYROIDECTOMY  2004   TRANSURETHRAL RESECTION OF BLADDER TUMOR N/A 04/28/2015   Procedure: TRANSURETHRAL RESECTION OF BLADDER TUMOR (TURBT) ;  Surgeon: Franchot Gallo, MD;  [facility];  Service: Urology;  Laterality: N/A;     OB History   No obstetric history on file.     Family History  Problem Relation Age of Onset   Diabetes Mother    Stroke Mother    Heart disease Mother    Cancer Sister    Alcohol abuse Father    Colon cancer Father    Alcohol abuse Sister    Alcohol abuse Brother    Drug abuse Brother    Alcohol abuse Brother    Drug abuse Brother     Social History   Tobacco Use   Smoking status: Every Day    Packs/day: 0.35    Years: 13.00    Pack years: 4.55    Types: Cigarettes  Smokeless tobacco: Never  Vaping Use   Vaping Use: Never used  Substance Use Topics   Alcohol use: Yes    Alcohol/week: 3.0 standard drinks    Types: 3 Glasses of wine per week    Comment: wine   Drug use: Yes    Types: "Crack" cocaine, Marijuana    Home Medications Prior to Admission medications   Medication Sig Start Date End Date Taking? Authorizing Provider  naproxen (NAPROSYN) 500 MG tablet Take 1 tablet (500 mg total) by mouth 2 (two) times daily for 15 doses. 11/29/20 12/07/20 Yes Luna Fuse, MD  oxyCODONE-acetaminophen (PERCOCET/ROXICET) 5-325 MG tablet Take 1 tablet by mouth every 6 (six) hours as needed for up to 8 doses for severe pain. 11/29/20  Yes Hamdi Vari, Greggory Brandy, MD  tiZANidine (ZANAFLEX) 4 MG tablet Take 1 tablet (4 mg total) by mouth every 6 (six)  hours as needed for up to 20 doses for muscle spasms. 11/29/20  Yes Luna Fuse, MD  amLODipine (NORVASC) 5 MG tablet TAKE 1 TABLET(5 MG) BY MOUTH DAILY 06/02/20   Janith Lima, MD  ARIPiprazole (ABILIFY) 2 MG tablet TAKE 1 TABLET(2 MG) BY MOUTH DAILY 11/01/20   Janith Lima, MD  atorvastatin (LIPITOR) 10 MG tablet TK 1 T PO QHS 09/02/18   [provider]  bimatoprost (LUMIGAN) 0.01 % SOLN Place 1 drop into both eyes at bedtime. 10/14/17   Nuala Alpha, MD  doxazosin (CARDURA) 1 MG tablet TAKE 1 TABLET(1 MG) BY MOUTH TWICE DAILY 06/02/20   Janith Lima, MD  indapamide (LOZOL) 1.25 MG tablet TAKE 1 TABLET(1.25 MG) BY MOUTH DAILY 06/02/20   Janith Lima, MD  levothyroxine (SYNTHROID) 200 MCG tablet Take 1 tablet (200 mcg total) by mouth daily before breakfast. 02/23/20   Janith Lima, MD  potassium chloride SA (KLOR-CON) 20 MEQ tablet TAKE 1 TABLET(20 MEQ) BY MOUTH TWICE DAILY 08/05/20   Janith Lima, MD  thiamine 100 MG tablet Take 1 tablet (100 mg total) by mouth daily. 04/10/20   Janith Lima, MD  traZODone (DESYREL) 50 MG tablet TAKE 1 TABLET(50 MG) BY MOUTH AT BEDTIME 06/06/20   Janith Lima, MD    Allergies    Patient has no known allergies.  Review of Systems   Review of Systems  Constitutional:  Negative for fever.  HENT:  Negative for ear pain.   Eyes:  Negative for pain.  Respiratory:  Negative for cough.   Cardiovascular:  Negative for chest pain.  Gastrointestinal:  Negative for abdominal pain.  Genitourinary:  Negative for flank pain.  Musculoskeletal:  Positive for back pain.  Skin:  Negative for rash.  Neurological:  Negative for headaches.   Physical Exam Updated Vital Signs BP 137/75 (BP Location: Right Arm)   Pulse 76   Temp 98.7 F (37.1 C) (Oral)   Resp 18   LMP  (LMP Unknown)   SpO2 98%   Physical Exam Constitutional:      General: She is not in acute distress.    Appearance: Normal appearance.  HENT:     Head: Normocephalic.      Nose: Nose normal.  Eyes:     Extraocular Movements: Extraocular movements intact.  Cardiovascular:     Rate and Rhythm: Normal rate.  Pulmonary:     Effort: Pulmonary effort is normal.  Musculoskeletal:        General: Normal range of motion.     Cervical back: Normal range of  motion.     Comments: No C or T or L-spine midline step-offs or tenderness noted.  L3-4 right paraspinal tenderness is present.  Neurological:     General: No focal deficit present.     Mental Status: She is alert and oriented to person, place, and time. Mental status is at baseline.     Cranial Nerves: No cranial nerve deficit.     Motor: No weakness.     Comments: Patient has an antalgic gait secondary to pain but no focal weakness noted.  Patient able to stand and weight-bear.    ED Results / Procedures / Treatments   Labs (all labs ordered are listed, but only abnormal results are displayed) Labs Reviewed - No data to display  EKG None  Radiology DG Lumbar Spine Complete  Result Date: 11/29/2020 CLINICAL DATA:  Low back pain EXAM: LUMBAR SPINE - COMPLETE 4+ VIEW COMPARISON:  None. FINDINGS: 5 nonrib bearing lumbar-type vertebral bodies. Vertebral body heights are maintained. No acute fracture. Minimal grade 1 anterolisthesis of L3 on L4 secondary to facet disease. 2 mm retrolisthesis of L5 on S1. No spondylolysis. Degenerative disease with disc height loss throughout the thoracolumbar spine. Bilateral facet arthropathy at L3-4, L4-5 and L5-S1. SI joints are unremarkable. Mild osteoarthritis of the right hip. Moderate osteoarthritis of the left hip. IMPRESSION: 1. Lumbar spine spondylosis as described above. 2. No acute osseous injury of the lumbar spine. Electronically Signed   By: Kathreen Devoid M.D.   On: 11/29/2020 13:45    Procedures Procedures   Medications Ordered in ED Medications  traMADol (ULTRAM) tablet 50 mg (50 mg Oral Given 11/29/20 1320)  acetaminophen (TYLENOL) tablet 650 mg (650 mg Oral  Given 11/29/20 1320)  ibuprofen (ADVIL) tablet 600 mg (600 mg Oral Given 11/29/20 1320)    ED Course  I have reviewed the triage vital signs and the nursing notes.  Pertinent labs & imaging results that were available during my care of the patient were reviewed by me and considered in my medical decision making (see chart for details).    MDM Rules/Calculators/A&P                           X-ray unremarkable for acute fracture dislocation or foreign body.  Patient given pain medication here with improvement.  Discharged home in stable condition, advised follow-up with her doctor within the week.  Advising me return for new numbness weakness fevers or any additional concerns.  Final Clinical Impression(s) / ED Diagnoses Final diagnoses:  Acute midline low back pain, unspecified whether sciatica present    Rx / DC Orders ED Discharge Orders          Ordered    tiZANidine (ZANAFLEX) 4 MG tablet  Every 6 hours PRN        11/29/20 1359    naproxen (NAPROSYN) 500 MG tablet  2 times daily        11/29/20 1359    oxyCODONE-acetaminophen (PERCOCET/ROXICET) 5-325 MG tablet  Every 6 hours PRN        11/29/20 1359             Luna Fuse, MD 11/29/20 1400

## 2020-11-29 NOTE — Discharge Instructions (Addendum)
Not show any fracture or dislocation.  Take the medications as written.  Follow-up with your doctor within the week.  Return if your symptoms worsen or if you have new numbness weakness or any additional concerns.

## 2020-11-29 NOTE — ED Triage Notes (Signed)
Pt presents via EMS with c/o back pain. Pt reported that she was helping a friend off of the ground and hurt her back lifting her friend. Pt ambulatory on scene.

## 2021-01-02 ENCOUNTER — Other Ambulatory Visit: Payer: Self-pay | Admitting: Internal Medicine

## 2021-01-02 DIAGNOSIS — I10 Essential (primary) hypertension: Secondary | ICD-10-CM

## 2021-01-08 ENCOUNTER — Other Ambulatory Visit: Payer: Self-pay | Admitting: Internal Medicine

## 2021-01-11 ENCOUNTER — Telehealth: Payer: Self-pay | Admitting: Internal Medicine

## 2021-01-11 NOTE — Telephone Encounter (Signed)
Pt has been informed that an OV was needed to see PCP. She stated that she would like to keep the upcoming OV she has scheduled with Dr. Sharlet Salina to discuss.

## 2021-01-11 NOTE — Telephone Encounter (Signed)
Pt. Is requesting call back to discuss muscle spasm medication. Pt. Does not know name of med. Wanting to speak with nurse, specifically. No further information given.   Callback #- [id number]

## 2021-01-18 ENCOUNTER — Ambulatory Visit (INDEPENDENT_AMBULATORY_CARE_PROVIDER_SITE_OTHER): Payer: Medicare Other | Admitting: Internal Medicine

## 2021-01-18 ENCOUNTER — Encounter: Payer: Self-pay | Admitting: Internal Medicine

## 2021-01-18 ENCOUNTER — Other Ambulatory Visit: Payer: Self-pay

## 2021-01-18 VITALS — BP 130/80 | HR 82 | Resp 18 | Ht 68.0 in | Wt 174.4 lb

## 2021-01-18 DIAGNOSIS — G8929 Other chronic pain: Secondary | ICD-10-CM | POA: Diagnosis not present

## 2021-01-18 DIAGNOSIS — M545 Low back pain, unspecified: Secondary | ICD-10-CM | POA: Insufficient documentation

## 2021-01-18 MED ORDER — KETOROLAC TROMETHAMINE 30 MG/ML IJ SOLN
30.0000 mg | Freq: Once | INTRAMUSCULAR | Status: AC
Start: 2021-01-18 — End: 2021-01-18
  Administered 2021-01-18: 10:00:00 30 mg via INTRAMUSCULAR

## 2021-01-18 MED ORDER — METHYLPREDNISOLONE ACETATE 40 MG/ML IJ SUSP
40.0000 mg | Freq: Once | INTRAMUSCULAR | Status: AC
  Administered 2021-01-18: 10:00:00 40 mg via INTRAMUSCULAR

## 2021-01-18 MED ORDER — CYCLOBENZAPRINE HCL 10 MG PO TABS: 10.0000 mg | ORAL_TABLET | Freq: Three times a day (TID) | ORAL | 0 refills | Status: DC | PRN

## 2021-01-18 NOTE — Assessment & Plan Note (Signed)
She states oxycodone 5 mg did not work then asks for higher mg. Given toradol 30 mg IM and depo-medrol 40 mg IM for the pain. Rx cyclobenzaprine. She states last drug use years ago but I note positive cocaine UDS from Dec 2021 and will not prescribe opioids.

## 2021-01-18 NOTE — Progress Notes (Signed)
   Subjective:   Patient ID: Raven [name], female    DOB: [birth year], 60 y.o.   MRN: [id number]  HPI The patient is a 60 YO female coming in for 2 years of back pain. ER follow up from September. She was given some oxycodone from the ER and muscle relaxers and states oxycodone did not help so she took 2 at once which did help. She does have past history of drug use but states that this has been a [place] time ago. Requesting more oxycodone possibly stronger mg.   Review of Systems  Constitutional: Negative.   HENT: Negative.    Eyes: Negative.   Respiratory:  Negative for cough, chest tightness and shortness of breath.   Cardiovascular:  Negative for chest pain, palpitations and leg swelling.  Gastrointestinal:  Negative for abdominal distention, abdominal pain, constipation, diarrhea, nausea and vomiting.  Musculoskeletal:  Positive for arthralgias and back pain.  Skin: Negative.   Neurological: Negative.   Psychiatric/Behavioral: Negative.     Objective:  Physical Exam Constitutional:      Appearance: She is well-developed.  HENT:     Head: Normocephalic and atraumatic.  Cardiovascular:     Rate and Rhythm: Normal rate and regular rhythm.  Pulmonary:     Effort: Pulmonary effort is normal. No respiratory distress.     Breath sounds: Normal breath sounds. No wheezing or rales.  Abdominal:     General: Bowel sounds are normal. There is no distension.     Palpations: Abdomen is soft.     Tenderness: There is no abdominal tenderness. There is no rebound.  Musculoskeletal:        General: Tenderness present.     Cervical back: Normal range of motion.     Comments: Tenderness lumbar bilaterally  Skin:    General: Skin is warm and dry.  Neurological:     Mental Status: She is alert and oriented to person, place, and time.     Coordination: Coordination normal.    Vitals:   01/18/21 0940  BP: 130/80  Pulse: 82  Resp: 18  SpO2: 100%  Weight: 174 lb 6.4 oz (79.1 kg)  Height:  5\' 8"  (1.727 m)    This visit occurred during the SARS-CoV-2 public health emergency.  Safety protocols were in place, including screening questions prior to the visit, additional usage of staff PPE, and extensive cleaning of exam room while observing appropriate contact time as indicated for disinfecting solutions.   Assessment & Plan:  Depo-medrol 40 mg IM and Toradol 30 mg IM given at visit

## 2021-01-18 NOTE — Patient Instructions (Addendum)
We have given you a shot today of the steroids and the pain medicine toradol.   We have sent in flexeril (cyclobenzaprine) which is a pain medicine you can use up to 3 times a day.   Since the oxycodone did not work well we are trying something different which is safer to take.   I want you to come back to see Dr. Ronnald Ramp in 2-3 weeks to make sure you are getting better.

## 2021-03-06 ENCOUNTER — Ambulatory Visit: Payer: Medicare Other | Admitting: Internal Medicine

## 2021-03-28 ENCOUNTER — Telehealth: Payer: Self-pay | Admitting: Internal Medicine

## 2021-03-28 ENCOUNTER — Other Ambulatory Visit: Payer: Self-pay | Admitting: Internal Medicine

## 2021-03-28 NOTE — Telephone Encounter (Signed)
Rep w/ [facility] checking status of new rx request for potassium chloride SA (KLOR-CON) 20 MEQ tablet amLODipine (NORVASC) 5 MG tablet levothyroxine (SYNTHROID) 200 MCG tablet ARIPiprazole (ABILIFY) 2 MG tablet traZODone (DESYREL) 50 MG tablet doxazosin (CARDURA) 1 MG tablet indapamide (LOZOL) 1.25 MG tablet  faxed on 03-20-2021 and 03-28-2021  Form has been printed and placed in provider's box   Phone [phone] - April

## 2021-04-03 ENCOUNTER — Telehealth: Payer: Self-pay | Admitting: Internal Medicine

## 2021-04-03 DIAGNOSIS — F333 Major depressive disorder, recurrent, severe with psychotic symptoms: Secondary | ICD-10-CM

## 2021-04-03 DIAGNOSIS — E039 Hypothyroidism, unspecified: Secondary | ICD-10-CM

## 2021-04-04 ENCOUNTER — Ambulatory Visit: Payer: Medicare Other | Admitting: Internal Medicine

## 2021-04-26 NOTE — Telephone Encounter (Signed)
1.Medication Requested: amLODipine (NORVASC) 5 MG tablet levothyroxine (SYNTHROID) 200 MCG tablet traZODone (DESYREL) 50 MG tablet   2. Pharmacy (Name, [place], [place]): [id number] - [place], [place]  Phone:  [phone] Fax:  [phone]   3. On Med List: yes  4. Last Visit with PCP: 02.02.22  5. Next visit date with PCP: 03.29.23  **Patient is requesting short supply until visit**   Agent: Please be advised that RX refills may take up to 3 business days. We ask that you follow-up with your pharmacy.

## 2021-04-26 NOTE — Telephone Encounter (Signed)
Called pt, LVM stating an OV is needed for refills.

## 2021-05-03 NOTE — Telephone Encounter (Signed)
NOTE NOT NEEDED ?

## 2021-05-30 ENCOUNTER — Encounter: Payer: Self-pay | Admitting: Internal Medicine

## 2021-05-30 ENCOUNTER — Ambulatory Visit (INDEPENDENT_AMBULATORY_CARE_PROVIDER_SITE_OTHER): Payer: 59 | Admitting: Internal Medicine

## 2021-05-30 VITALS — BP 136/84 | HR 76 | Temp 98.2°F | Ht 68.0 in | Wt 183.0 lb

## 2021-05-30 DIAGNOSIS — E785 Hyperlipidemia, unspecified: Secondary | ICD-10-CM | POA: Diagnosis not present

## 2021-05-30 DIAGNOSIS — C689 Malignant neoplasm of urinary organ, unspecified: Secondary | ICD-10-CM

## 2021-05-30 DIAGNOSIS — G737 Myopathy in diseases classified elsewhere: Secondary | ICD-10-CM | POA: Diagnosis not present

## 2021-05-30 DIAGNOSIS — E118 Type 2 diabetes mellitus with unspecified complications: Secondary | ICD-10-CM

## 2021-05-30 DIAGNOSIS — R0609 Other forms of dyspnea: Secondary | ICD-10-CM

## 2021-05-30 DIAGNOSIS — N1831 Chronic kidney disease, stage 3a: Secondary | ICD-10-CM | POA: Diagnosis not present

## 2021-05-30 DIAGNOSIS — R9431 Abnormal electrocardiogram [ECG] [EKG]: Secondary | ICD-10-CM

## 2021-05-30 DIAGNOSIS — I10 Essential (primary) hypertension: Secondary | ICD-10-CM | POA: Diagnosis not present

## 2021-05-30 DIAGNOSIS — E1121 Type 2 diabetes mellitus with diabetic nephropathy: Secondary | ICD-10-CM

## 2021-05-30 DIAGNOSIS — E119 Type 2 diabetes mellitus without complications: Secondary | ICD-10-CM | POA: Diagnosis not present

## 2021-05-30 DIAGNOSIS — Z124 Encounter for screening for malignant neoplasm of cervix: Secondary | ICD-10-CM

## 2021-05-30 DIAGNOSIS — E5111 Dry beriberi: Secondary | ICD-10-CM

## 2021-05-30 DIAGNOSIS — E039 Hypothyroidism, unspecified: Secondary | ICD-10-CM

## 2021-05-30 DIAGNOSIS — Z1231 Encounter for screening mammogram for malignant neoplasm of breast: Secondary | ICD-10-CM

## 2021-05-30 LAB — CBC WITH DIFFERENTIAL/PLATELET
Basophils Absolute: 0 10*3/uL (ref 0.0–0.1)
Basophils Relative: 0.8 % (ref 0.0–3.0)
Eosinophils Absolute: 0.2 10*3/uL (ref 0.0–0.7)
Eosinophils Relative: 2.9 % (ref 0.0–5.0)
HCT: 36.5 % (ref 36.0–46.0)
Hemoglobin: 12.3 g/dL (ref 12.0–15.0)
Lymphocytes Relative: 32.4 % (ref 12.0–46.0)
Lymphs Abs: 1.8 10*3/uL (ref 0.7–4.0)
MCHC: 33.6 g/dL (ref 30.0–36.0)
MCV: 92.2 fl (ref 78.0–100.0)
Monocytes Absolute: 0.3 10*3/uL (ref 0.1–1.0)
Monocytes Relative: 5.5 % (ref 3.0–12.0)
Neutro Abs: 3.2 10*3/uL (ref 1.4–7.7)
Neutrophils Relative %: 58.4 % (ref 43.0–77.0)
Platelets: 234 10*3/uL (ref 150.0–400.0)
RBC: 3.96 Mil/uL (ref 3.87–5.11)
RDW: 13.9 % (ref 11.5–15.5)
WBC: 5.5 10*3/uL (ref 4.0–10.5)

## 2021-05-30 LAB — HEPATIC FUNCTION PANEL
ALT: 14 U/L (ref 0–35)
AST: 26 U/L (ref 0–37)
Albumin: 4.2 g/dL (ref 3.5–5.2)
Alkaline Phosphatase: 64 U/L (ref 39–117)
Bilirubin, Direct: 0.1 mg/dL (ref 0.0–0.3)
Total Bilirubin: 0.3 mg/dL (ref 0.2–1.2)
Total Protein: 7 g/dL (ref 6.0–8.3)

## 2021-05-30 LAB — BASIC METABOLIC PANEL
BUN: 27 mg/dL — ABNORMAL HIGH (ref 6–23)
CO2: 24 mEq/L (ref 19–32)
Calcium: 9.5 mg/dL (ref 8.4–10.5)
Chloride: 109 mEq/L (ref 96–112)
Creatinine, Ser: 1.69 mg/dL — ABNORMAL HIGH (ref 0.40–1.20)
GFR: 32.66 mL/min — ABNORMAL LOW (ref >60.00)
Glucose, Bld: 91 mg/dL (ref 70–99)
Potassium: 3.8 mEq/L (ref 3.5–5.1)
Sodium: 141 mEq/L (ref 135–145)

## 2021-05-30 LAB — HEMOGLOBIN A1C: Hgb A1c MFr Bld: 6.5 % (ref 4.6–6.5)

## 2021-05-30 LAB — LIPID PANEL
Cholesterol: 228 mg/dL — ABNORMAL HIGH (ref 0–200)
HDL: 69.4 mg/dL (ref >39.00)
LDL Cholesterol: 128 mg/dL — ABNORMAL HIGH (ref 0–99)
NonHDL: 158.41
Total CHOL/HDL Ratio: 3
Triglycerides: 151 mg/dL — ABNORMAL HIGH (ref 0.0–149.0)
VLDL: 30.2 mg/dL (ref 0.0–40.0)

## 2021-05-30 LAB — TROPONIN I (HIGH SENSITIVITY): High Sens Troponin I: 11 ng/L (ref 2–17)

## 2021-05-30 LAB — TSH: TSH: 20.47 u[IU]/mL — ABNORMAL HIGH (ref 0.35–5.50)

## 2021-05-30 MED ORDER — THIAMINE HCL 100 MG PO TABS: 100.0000 mg | ORAL_TABLET | Freq: Every day | ORAL | 0 refills | Status: AC

## 2021-05-30 NOTE — Progress Notes (Signed)
? ?Subjective:  ?Patient ID: Raven [name], female    DOB: 07/02/1960  Age: 61 y.o. MRN: [id number] ? ?CC: Hypertension, Anemia, Diabetes, Hypothyroidism, and Hyperlipidemia ? ?This visit occurred during the SARS-CoV-2 public health emergency.  Safety protocols were in place, including screening questions prior to the visit, additional usage of staff PPE, and extensive cleaning of exam room while observing appropriate contact time as indicated for disinfecting solutions.   ? ?HPI ?[name] presents for f/up - ? ?Based on prescription refills she is not currently taking any medications.  She walks about 45 minutes a day and for 5 months has had dyspnea on exertion but denies chest pain, diaphoresis, dizziness, lightheadedness, or edema. ? ?Outpatient Medications Prior to Visit  ?Medication Sig Dispense Refill  ? ARIPiprazole (ABILIFY) 2 MG tablet TAKE 1 TABLET(2 MG) BY MOUTH DAILY 90 tablet 0  ? bimatoprost (LUMIGAN) 0.01 % SOLN Place 1 drop into both eyes at bedtime. 5 mL 0  ? doxazosin (CARDURA) 1 MG tablet TAKE 1 TABLET(1 MG) BY MOUTH TWICE DAILY 180 tablet 0  ? potassium chloride SA (KLOR-CON) 20 MEQ tablet TAKE 1 TABLET(20 MEQ) BY MOUTH TWICE DAILY 180 tablet 0  ? traZODone (DESYREL) 50 MG tablet TAKE 1 TABLET(50 MG) BY MOUTH AT BEDTIME 90 tablet 1  ? amLODipine (NORVASC) 5 MG tablet TAKE 1 TABLET(5 MG) BY MOUTH DAILY 90 tablet 0  ? atorvastatin (LIPITOR) 10 MG tablet TK 1 T PO QHS    ? cyclobenzaprine (FLEXERIL) 10 MG tablet Take 1 tablet (10 mg total) by mouth 3 (three) times daily as needed for muscle spasms. 60 tablet 0  ? indapamide (LOZOL) 1.25 MG tablet TAKE 1 TABLET(1.25 MG) BY MOUTH DAILY 90 tablet 0  ? levothyroxine (SYNTHROID) 200 MCG tablet Take 1 tablet (200 mcg total) by mouth daily before breakfast. 90 tablet 1  ? thiamine 100 MG tablet Take 1 tablet (100 mg total) by mouth daily. 90 tablet 0  ? ?No facility-administered medications prior to visit.  ? ? ?ROS ?Review of Systems   ?Constitutional:  Negative for diaphoresis, fatigue and unexpected weight change.  ?HENT: Negative.    ?Eyes: Negative.   ?Respiratory:  Positive for shortness of breath. Negative for cough, chest tightness and wheezing.   ?Cardiovascular:  Negative for chest pain, palpitations and leg swelling.  ?Gastrointestinal:  Negative for abdominal pain, constipation, diarrhea, nausea and vomiting.  ?Endocrine: Negative.   ?Genitourinary: Negative.  Negative for difficulty urinating and hematuria.  ?Musculoskeletal:  Positive for arthralgias. Negative for myalgias.  ?Skin: Negative.   ?Neurological:  Negative for dizziness, weakness, light-headedness and headaches.  ?Hematological:  Negative for adenopathy. Does not bruise/bleed easily.  ?Psychiatric/Behavioral:  Negative for sleep disturbance and suicidal ideas.   ? ?Objective:  ?BP 136/84 (BP Location: Left Arm, Patient Position: Sitting, Cuff Size: Large)   Pulse 76   Temp 98.2 ?F (36.8 ?C) (Oral)   Ht '5\' 8"'$  (1.727 m)   Wt 183 lb (83 kg)   LMP  (LMP Unknown)   SpO2 98%   BMI 27.83 kg/m?  ? ?BP Readings from Last 3 Encounters:  ?05/30/21 136/84  ?01/18/21 130/80  ?11/29/20 137/75  ? ? ?Wt Readings from Last 3 Encounters:  ?05/30/21 183 lb (83 kg)  ?01/18/21 174 lb 6.4 oz (79.1 kg)  ?04/05/20 162 lb (73.5 kg)  ? ? ?Physical Exam ?Vitals reviewed.  ?HENT:  ?   Nose: Nose normal.  ?   Mouth/Throat:  ?   Mouth: Mucous  membranes are moist.  ?Eyes:  ?   General: No scleral icterus. ?   Conjunctiva/sclera: Conjunctivae normal.  ?Cardiovascular:  ?   Rate and Rhythm: Normal rate and regular rhythm.  ?   Heart sounds: No murmur heard. ?   Comments: EKG- ?NSR, 61 bpm ?Q waves in aVR and V1 are old ?Q wave in V2 is new - ? Septal infarct ?No LVH ?Pulmonary:  ?   Effort: Pulmonary effort is normal.  ?   Breath sounds: No stridor. No wheezing, rhonchi or rales.  ?Abdominal:  ?   General: Abdomen is flat.  ?   Palpations: There is no mass.  ?   Tenderness: There is no abdominal  tenderness. There is no guarding or rebound.  ?   Hernia: No hernia is present.  ?Musculoskeletal:     ?   General: No swelling or deformity.  ?   Cervical back: Neck supple.  ?   Right lower leg: No edema.  ?   Left lower leg: No edema.  ?Lymphadenopathy:  ?   Cervical: No cervical adenopathy.  ?Skin: ?   General: Skin is warm and dry.  ?   Coloration: Skin is not pale.  ?Neurological:  ?   General: No focal deficit present.  ?   Mental Status: She is alert. Mental status is at baseline.  ?Psychiatric:     ?   Attention and Perception: Perception normal. She is inattentive.     ?   Mood and Affect: Mood is not anxious or depressed. Affect is blunt and inappropriate. Affect is not labile, flat or tearful.     ?   Speech: Speech is delayed and tangential.     ?   Behavior: Behavior normal. Behavior is cooperative.     ?   Thought Content: Thought content is not paranoid or delusional. Thought content does not include homicidal or suicidal ideation.     ?   Cognition and Memory: Cognition normal.     ?   Judgment: Judgment normal.  ? ? ?Lab Results  ?Component Value Date  ? WBC 5.5 05/30/2021  ? HGB 12.3 05/30/2021  ? HCT 36.5 05/30/2021  ? PLT 234.0 05/30/2021  ? GLUCOSE 91 05/30/2021  ? CHOL 228 (H) 05/30/2021  ? TRIG 151.0 (H) 05/30/2021  ? HDL 69.40 05/30/2021  ? [place] 128 (H) 05/30/2021  ? ALT 14 05/30/2021  ? AST 26 05/30/2021  ? NA 141 05/30/2021  ? K 3.8 05/30/2021  ? CL 109 05/30/2021  ? CREATININE 1.69 (H) 05/30/2021  ? BUN 27 (H) 05/30/2021  ? CO2 24 05/30/2021  ? TSH 20.47 (H) 05/30/2021  ? HGBA1C 6.5 05/30/2021  ? MICROALBUR 1.5 02/23/2020  ? ? ?DG Lumbar Spine Complete ? ?Result Date: 11/29/2020 ?CLINICAL DATA:  Low back pain EXAM: LUMBAR SPINE - COMPLETE 4+ VIEW COMPARISON:  None. FINDINGS: 5 nonrib bearing lumbar-type vertebral bodies. Vertebral body heights are maintained. No acute fracture. Minimal grade 1 anterolisthesis of L3 on L4 secondary to facet disease. 2 mm retrolisthesis of L5 on S1. No  spondylolysis. Degenerative disease with disc height loss throughout the thoracolumbar spine. Bilateral facet arthropathy at L3-4, L4-5 and L5-S1. SI joints are unremarkable. Mild osteoarthritis of the right hip. Moderate osteoarthritis of the left hip. IMPRESSION: 1. Lumbar spine spondylosis as described above. 2. No acute osseous injury of the lumbar spine. Electronically Signed   By: Kathreen Devoid M.D.   On: 11/29/2020 13:45  ? ? ?Assessment &  Plan:  ? ?Kashish was seen today for hypertension, anemia, diabetes, hypothyroidism and hyperlipidemia. ? ?Diagnoses and all orders for this visit: ? ?Primary hypertension-Her blood pressure is adequately well controlled. ?-     Basic metabolic panel; Future ?-     Hepatic function panel; Future ?-     EKG 12-Lead ?-     Hepatic function panel ?-     Basic metabolic panel ?-     amLODipine (NORVASC) 5 MG tablet; Take 1 tablet (5 mg total) by mouth daily. ? ?Acquired hypothyroidism- Her TSH is elevated.  I have asked her to restart T4. ?-     TSH; Future ?-     TSH ?-     levothyroxine (SYNTHROID) 150 MCG tablet; Take 1 tablet (150 mcg total) by mouth daily. ? ?Thiamine deficiency neuropathy ?-     CBC with Differential/Platelet; Future ?-     thiamine 100 MG tablet; Take 1 tablet (100 mg total) by mouth daily. ?-     CBC with Differential/Platelet ? ?Hypothyroid myopathy ?-     TSH; Future ?-     TSH ? ?Stage 3a chronic kidney disease ([place])- Her renal function has declined slightly. ?-     Urinalysis, Routine w reflex microscopic; Future ?-     Urinalysis, Routine w reflex microscopic ?-     AMB Referral to [place] ? ?Hyperlipidemia with target LDL less than 130- She has not achieved her LDL goal.  I have asked her to restart the statin. ?-     Lipid panel; Future ?-     TSH; Future ?-     Hepatic function panel; Future ?-     Hepatic function panel ?-     TSH ?-     Lipid panel ?-     atorvastatin (LIPITOR) 20 MG tablet; Take 1 tablet (20 mg total) by  mouth daily. ?-     aspirin 81 MG EC tablet; Take 1 tablet (81 mg total) by mouth daily. Swallow whole. ? ?Type 2 diabetes mellitus without complication, without [place]-term current use of insulin ([place])- Her blood sugar is adequate

## 2021-05-30 NOTE — Patient Instructions (Signed)

## 2021-05-31 DIAGNOSIS — E118 Type 2 diabetes mellitus with unspecified complications: Secondary | ICD-10-CM | POA: Insufficient documentation

## 2021-05-31 DIAGNOSIS — E1121 Type 2 diabetes mellitus with diabetic nephropathy: Secondary | ICD-10-CM | POA: Insufficient documentation

## 2021-05-31 MED ORDER — AMLODIPINE BESYLATE 5 MG PO TABS: 5.0000 mg | ORAL_TABLET | Freq: Every day | ORAL | 0 refills | Status: DC

## 2021-05-31 MED ORDER — ASPIRIN 81 MG PO TBEC: 81.0000 mg | DELAYED_RELEASE_TABLET | Freq: Every day | ORAL | 1 refills | Status: AC

## 2021-05-31 MED ORDER — LEVOTHYROXINE SODIUM 150 MCG PO TABS: 150.0000 ug | ORAL_TABLET | Freq: Every day | ORAL | 1 refills | Status: DC

## 2021-05-31 MED ORDER — ATORVASTATIN CALCIUM 20 MG PO TABS: 20.0000 mg | ORAL_TABLET | Freq: Every day | ORAL | 1 refills | Status: DC

## 2021-06-02 ENCOUNTER — Encounter: Payer: Self-pay | Admitting: Internal Medicine

## 2021-06-05 ENCOUNTER — Telehealth: Payer: Self-pay | Admitting: *Deleted

## 2021-06-05 NOTE — Chronic Care Management (AMB) (Signed)
?  Care Management  ? ?Outreach Note ? ?06/05/2021 ?Name: Raven [name] MRN: [id number] DOB: [birth year] ? ?Referred by: Janith Lima, MD ?Reason for referral : Care Coordination (Initial outreach to schedule referral with RNCM, SW, and PharmD) ? ? ?An unsuccessful telephone outreach was attempted today. The patient was referred to the case management team for assistance with care management and care coordination.  ? ?Follow Up Plan:  ?A HIPAA compliant phone message was left for the patient providing contact information and requesting a return call.  ?The care management team will reach out to the patient again over the next 7 days.  ?If patient returns call to provider office, please advise to call [place] * at [phone].* ? ?Raven [name]  ?Care Guide, Embedded Care Coordination ?[name]  Care Management  ?Direct Dial: [phone] ? ?

## 2021-06-06 NOTE — Chronic Care Management (AMB) (Signed)
?  Care Management  ? ?Outreach Note ? ?06/06/2021 ?Name: FIA HEBERT MRN: [id number] DOB: 1960/11/26 ? ?Referred by: Janith Lima, MD ?Reason for referral : Care Coordination (Initial outreach to schedule referral with RNCM, SW, and PharmD) ? ? ?A second unsuccessful telephone outreach was attempted today. The patient was referred to the case management team for assistance with care management and care coordination.  ? ?Follow Up Plan:  ?A HIPAA compliant phone message was left for the patient providing contact information and requesting a return call.  ?The care management team will reach out to the patient again over the next 7 days.  ?If patient returns call to provider office, please advise to call [place] * at [phone].* ? ?Laverda Sorenson  ?Care Guide, Embedded Care Coordination ?[facility]  Care Management  ?Direct Dial: [phone] ? ?

## 2021-06-07 DIAGNOSIS — H401132 Primary open-angle glaucoma, bilateral, moderate stage: Secondary | ICD-10-CM | POA: Diagnosis not present

## 2021-06-07 NOTE — Chronic Care Management (AMB) (Signed)
?  Care Management  ? ?Note ? ?06/07/2021 ?Name: [name] MRN: [id number] DOB: [birth year] ? ?[name] is a 62 y.o. year old female who is a primary care patient of Janith Lima, MD. I reached out to [employer] by phone today offer care coordination services.  ? ?Ms. Richeson was given information about care management services today including:  ?Care management services include personalized support from designated clinical staff supervised by her physician, including individualized plan of care and coordination with other care providers ?24/7 contact phone numbers for assistance for urgent and routine care needs. ?The patient may stop care management services at any time by phone call to the office staff. ? ?Patient did not agree to enrollment in care management services and does not wish to consider at this time. ? ?Follow up plan: ?Patient declines engagement by the care management team. Appropriate care team members and provider have been notified via electronic communication. The care management team is available to follow up with the patient after provider conversation with the patient regarding recommendation for care management engagement and subsequent re-referral to the care management team.  ? ?Laverda Sorenson  ?Care Guide, Embedded Care Coordination ?[facility]  Care Management  ?Direct Dial: [phone] ? ?

## 2021-06-14 ENCOUNTER — Other Ambulatory Visit: Payer: Self-pay | Admitting: Internal Medicine

## 2021-06-14 DIAGNOSIS — R9431 Abnormal electrocardiogram [ECG] [EKG]: Secondary | ICD-10-CM

## 2021-06-14 DIAGNOSIS — R0609 Other forms of dyspnea: Secondary | ICD-10-CM

## 2021-06-24 ENCOUNTER — Other Ambulatory Visit: Payer: Self-pay | Admitting: Internal Medicine

## 2021-06-24 DIAGNOSIS — I10 Essential (primary) hypertension: Secondary | ICD-10-CM

## 2021-07-03 ENCOUNTER — Ambulatory Visit: Payer: 59

## 2021-07-09 ENCOUNTER — Ambulatory Visit
Admission: RE | Admit: 2021-07-09 | Discharge: 2021-07-09 | Disposition: A | Discharge status: Home / Self Care | Payer: Medicaid Other | Source: Ambulatory Visit | Attending: Internal Medicine | Admitting: Internal Medicine

## 2021-07-09 DIAGNOSIS — Z1231 Encounter for screening mammogram for malignant neoplasm of breast: Secondary | ICD-10-CM | POA: Diagnosis not present

## 2021-07-17 ENCOUNTER — Telehealth (HOSPITAL_COMMUNITY): Payer: Self-pay | Admitting: Internal Medicine

## 2021-07-17 NOTE — Telephone Encounter (Signed)
Just an FYI. ?We have made several attempts to contact your office to obtain a Prior Authorization in order to schedule Myoview. We will be removing the patient from the [place]. ? ? ?07/10/21 Inbasket x 5 sent for PA#  ?07/05/21 Inbasket x 4 sent for PA#  ?06/28/21 Inbasekt x 3 sent for PA#  ?06/21/21 Inbasket x 2 sent for PA# as High Priority/LBW  ?06/14/21 Inbasket sent for PA# ?CONSENT SIGNED ? ? ? ?Thank you  ?

## 2021-07-31 DIAGNOSIS — H25813 Combined forms of age-related cataract, bilateral: Secondary | ICD-10-CM | POA: Diagnosis not present

## 2021-07-31 DIAGNOSIS — H401132 Primary open-angle glaucoma, bilateral, moderate stage: Secondary | ICD-10-CM | POA: Diagnosis not present

## 2021-08-14 ENCOUNTER — Telehealth (HOSPITAL_COMMUNITY): Payer: Self-pay | Admitting: *Deleted

## 2021-08-14 ENCOUNTER — Encounter (HOSPITAL_COMMUNITY): Payer: Self-pay | Admitting: Internal Medicine

## 2021-08-14 NOTE — Telephone Encounter (Signed)
Patient given detailed instructions per Myocardial Perfusion Study Information Sheet for the test on 08/22/21 at 1045. Patient notified to arrive 15 minutes early and that it is imperative to arrive on time for appointment to keep from having the test rescheduled.When I got to the instructions of taking her meds and not eating after 0730,patient got upset and hung up. I called back and answer machine picked up.I left a message with the other instructions.   If you need to cancel or reschedule your appointment, please call the office within 24 hours of your appointment. . Patient verbalized understanding.Cambry [place], [place]

## 2021-08-16 ENCOUNTER — Telehealth: Payer: Self-pay | Admitting: Internal Medicine

## 2021-08-16 NOTE — Telephone Encounter (Signed)
Holly from [facility] called to ask about a referral they received to screen for cancer in the pt. Raven [name] states that there is nothing in our notes that would indicated the pt would be at risk for cancer and we have not seen the pt since March, 05-30-21.   Is this referral accurate?   Pt has an appointment tomorrow with [facility], so please call Raven [name] back to let her know how to proceed.   [phone] EXT. [phone]

## 2021-08-17 DIAGNOSIS — Z8551 Personal history of malignant neoplasm of bladder: Secondary | ICD-10-CM | POA: Diagnosis not present

## 2021-08-17 DIAGNOSIS — C674 Malignant neoplasm of posterior wall of bladder: Secondary | ICD-10-CM | POA: Diagnosis not present

## 2021-08-22 ENCOUNTER — Encounter (HOSPITAL_COMMUNITY): Payer: Medicare Other

## 2021-08-24 ENCOUNTER — Ambulatory Visit (HOSPITAL_COMMUNITY): Payer: Medicare Other | Attending: Cardiology

## 2021-08-24 DIAGNOSIS — R9431 Abnormal electrocardiogram [ECG] [EKG]: Secondary | ICD-10-CM | POA: Diagnosis not present

## 2021-08-24 DIAGNOSIS — R0609 Other forms of dyspnea: Secondary | ICD-10-CM | POA: Diagnosis not present

## 2021-08-24 LAB — MYOCARDIAL PERFUSION IMAGING
LV dias vol: 89 mL (ref 46–106)
LV sys vol: 36 mL
Nuc Stress EF: 60 %
Peak HR: 114 {beats}/min
Rest HR: 67 {beats}/min
Rest Nuclear Isotope Dose: 10.2 mCi
SDS: 0
SRS: 0
SSS: 0
ST Depression (mm): 0 mm
Stress Nuclear Isotope Dose: 32.7 mCi
TID: 0.87

## 2021-08-24 MED ORDER — REGADENOSON 0.4 MG/5ML IV SOLN
0.4000 mg | Freq: Once | INTRAVENOUS | Status: AC
  Administered 2021-08-24: 0.4 mg via INTRAVENOUS

## 2021-08-24 MED ORDER — TECHNETIUM TC 99M TETROFOSMIN IV KIT
810.0000 | PACK | Freq: Once | INTRAVENOUS | Status: AC | PRN
  Administered 2021-08-24: 810 via INTRAVENOUS

## 2021-08-24 MED ORDER — TECHNETIUM TC 99M TETROFOSMIN IV KIT
32.7000 | PACK | Freq: Once | INTRAVENOUS | Status: AC | PRN
  Administered 2021-08-24: 32.7 via INTRAVENOUS

## 2021-08-28 ENCOUNTER — Ambulatory Visit (HOSPITAL_BASED_OUTPATIENT_CLINIC_OR_DEPARTMENT_OTHER): Payer: 59 | Admitting: Advanced Practice Midwife

## 2021-08-30 ENCOUNTER — Ambulatory Visit (INDEPENDENT_AMBULATORY_CARE_PROVIDER_SITE_OTHER): Payer: Medicare Other | Admitting: Internal Medicine

## 2021-08-30 ENCOUNTER — Encounter: Payer: Self-pay | Admitting: Internal Medicine

## 2021-08-30 ENCOUNTER — Ambulatory Visit (INDEPENDENT_AMBULATORY_CARE_PROVIDER_SITE_OTHER): Payer: Medicare Other

## 2021-08-30 VITALS — BP 116/58 | HR 67 | Temp 98.6°F | Ht 68.0 in | Wt 176.0 lb

## 2021-08-30 DIAGNOSIS — R053 Chronic cough: Secondary | ICD-10-CM

## 2021-08-30 DIAGNOSIS — E5111 Dry beriberi: Secondary | ICD-10-CM

## 2021-08-30 DIAGNOSIS — Z23 Encounter for immunization: Secondary | ICD-10-CM | POA: Diagnosis not present

## 2021-08-30 DIAGNOSIS — E118 Type 2 diabetes mellitus with unspecified complications: Secondary | ICD-10-CM | POA: Diagnosis not present

## 2021-08-30 DIAGNOSIS — H4010X1 Unspecified open-angle glaucoma, mild stage: Secondary | ICD-10-CM | POA: Diagnosis not present

## 2021-08-30 DIAGNOSIS — R61 Generalized hyperhidrosis: Secondary | ICD-10-CM

## 2021-08-30 DIAGNOSIS — G737 Myopathy in diseases classified elsewhere: Secondary | ICD-10-CM | POA: Diagnosis not present

## 2021-08-30 DIAGNOSIS — N1831 Chronic kidney disease, stage 3a: Secondary | ICD-10-CM

## 2021-08-30 DIAGNOSIS — E039 Hypothyroidism, unspecified: Secondary | ICD-10-CM | POA: Diagnosis not present

## 2021-08-30 DIAGNOSIS — R059 Cough, unspecified: Secondary | ICD-10-CM | POA: Diagnosis not present

## 2021-08-30 LAB — URINALYSIS, ROUTINE W REFLEX MICROSCOPIC
Bilirubin Urine: NEGATIVE
Hgb urine dipstick: NEGATIVE
Ketones, ur: NEGATIVE
Leukocytes,Ua: NEGATIVE
Nitrite: NEGATIVE
RBC / HPF: NONE SEEN (ref 0–?)
Specific Gravity, Urine: 1.025 (ref 1.000–1.030)
Total Protein, Urine: NEGATIVE
Urine Glucose: NEGATIVE
Urobilinogen, UA: 2 — AB (ref 0.0–1.0)
pH: 6 (ref 5.0–8.0)

## 2021-08-30 LAB — CK: Total CK: 199 U/L — ABNORMAL HIGH (ref 7–177)

## 2021-08-30 LAB — BASIC METABOLIC PANEL
BUN: 17 mg/dL (ref 6–23)
CO2: 28 mEq/L (ref 19–32)
Calcium: 9.7 mg/dL (ref 8.4–10.5)
Chloride: 103 mEq/L (ref 96–112)
Creatinine, Ser: 1.42 mg/dL — ABNORMAL HIGH (ref 0.40–1.20)
GFR: 40.18 mL/min — ABNORMAL LOW (ref >60.00)
Glucose, Bld: 80 mg/dL (ref 70–99)
Potassium: 3.8 mEq/L (ref 3.5–5.1)
Sodium: 137 mEq/L (ref 135–145)

## 2021-08-30 LAB — MICROALBUMIN / CREATININE URINE RATIO
Creatinine,U: 214.1 mg/dL
Microalb Creat Ratio: 0.9 mg/g (ref 0.0–30.0)
Microalb, Ur: 1.9 mg/dL (ref 0.0–1.9)

## 2021-08-30 LAB — HEMOGLOBIN A1C: Hgb A1c MFr Bld: 6.4 % (ref 4.6–6.5)

## 2021-08-30 LAB — TSH: TSH: 24.99 u[IU]/mL — ABNORMAL HIGH (ref 0.35–5.50)

## 2021-08-30 NOTE — Patient Instructions (Signed)
Type 2 Diabetes Mellitus, Diagnosis, Adult Type 2 diabetes (type 2 diabetes mellitus) is a [place]-term, or chronic, disease. In type 2 diabetes, one or both of these problems may be present: The pancreas does not make enough of a hormone called insulin. Cells in the body do not respond properly to the insulin that the body makes (insulin resistance). Normally, insulin allows blood sugar (glucose) to enter cells in the body. The cells use glucose for energy. Insulin resistance or lack of insulin causes excess glucose to build up in the blood instead of going into cells. This causes high blood glucose (hyperglycemia).  What are the causes? The exact cause of type 2 diabetes is not known. What increases the risk? The following factors may make you more likely to develop this condition: Having a family member with type 2 diabetes. Being overweight or obese. Being inactive (sedentary). Having been diagnosed with insulin resistance. Having a history of prediabetes, diabetes when you were pregnant (gestational diabetes), or polycystic ovary syndrome (PCOS). What are the signs or symptoms? In the early stage of this condition, you may not have symptoms. Symptoms develop slowly and may include: Increased thirst or hunger. Increased urination. Unexplained weight loss. Tiredness (fatigue) or weakness. Vision changes, such as blurry vision. Dark patches on the skin. How is this diagnosed? This condition is diagnosed based on your symptoms, your medical history, a physical exam, and your blood glucose level. Your blood glucose may be checked with one or more of the following blood tests: A fasting blood glucose (FBG) test. You will not be allowed to eat (you will fast) for 8 hours or longer before a blood sample is taken. A random blood glucose test. This test checks blood glucose at any time of day regardless of when you ate. An A1C (hemoglobin A1C) blood test. This test provides information about blood  glucose levels over the previous 2-3 months. An oral glucose tolerance test (OGTT). This test measures your blood glucose at two times: After fasting. This is your baseline blood glucose level. Two hours after drinking a beverage that contains glucose. You may be diagnosed with type 2 diabetes if: Your fasting blood glucose level is 126 mg/dL (7.0 mmol/L) or higher. Your random blood glucose level is 200 mg/dL (11.1 mmol/L) or higher. Your A1C level is 6.5% or higher. Your oral glucose tolerance test result is higher than 200 mg/dL (11.1 mmol/L). These blood tests may be repeated to confirm your diagnosis. How is this treated? Your treatment may be managed by a specialist called an endocrinologist. Type 2 diabetes may be treated by following instructions from your health care provider about: Making dietary and lifestyle changes. These may include: Following a personalized nutrition plan that is developed by a registered dietitian. Exercising regularly. Finding ways to manage stress. Checking your blood glucose level as often as told. Taking diabetes medicines or insulin daily. This helps to keep your blood glucose levels in the healthy range. Taking medicines to help prevent complications from diabetes. Medicines may include: Aspirin. Medicine to lower cholesterol. Medicine to control blood pressure. Your health care provider will set treatment goals for you. Your goals will be based on your age, other medical conditions you have, and how you respond to diabetes treatment. Generally, the goal of treatment is to maintain the following blood glucose levels: Before meals: 80-130 mg/dL (4.4-7.2 mmol/L). After meals: below 180 mg/dL (10 mmol/L). A1C level: less than 7%. Follow these instructions at home: Questions to ask your health care provider   Consider asking the following questions: Should I meet with a certified diabetes care and education specialist? What diabetes medicines do I need,  and when should I take them? What equipment will I need to manage my diabetes at home? How often do I need to check my blood glucose? Where can I find a support group for people with diabetes? What number can I call if I have questions? When is my next appointment? General instructions Take over-the-counter and prescription medicines only as told by your health care provider. Keep all follow-up visits. This is important. Where to find more information For help and guidance and for more information about diabetes, please visit: American Diabetes Association (ADA): www.diabetes.org American Association of Diabetes Care and Education Specialists (ADCES): www.diabeteseducator.org International Diabetes Federation (IDF): www.idf.org Contact a health care provider if: Your blood glucose is at or above 240 mg/dL (13.3 mmol/L) for 2 days in a row. You have been sick or have had a fever for 2 days or longer, and you are not getting better. You have any of the following problems for more than 6 hours: You cannot eat or drink. You have nausea and vomiting. You have diarrhea. Get help right away if: You have severe hypoglycemia. This means your blood glucose is lower than 54 mg/dL (3.0 mmol/L). You become confused or you have trouble thinking clearly. You have difficulty breathing. You have moderate or large ketone levels in your urine. These symptoms may represent a serious problem that is an emergency. Do not wait to see if the symptoms will go away. Get medical help right away. Call your local emergency services (911 in the [place] Do not drive yourself to the hospital. Summary Type 2 diabetes mellitus is a [place]-term, or chronic, disease. In type 2 diabetes, the pancreas does not make enough of a hormone called insulin, or cells in the body do not respond properly to insulin that the body makes. This condition is treated by making dietary and lifestyle changes and taking diabetes medicines or  insulin. Your health care provider will set treatment goals for you. Your goals will be based on your age, other medical conditions you have, and how you respond to diabetes treatment. Keep all follow-up visits. This is important. This information is not intended to replace advice given to you by your health care provider. Make sure you discuss any questions you have with your health care provider. Document Revised: 05/15/2020 Document Reviewed: 05/15/2020 Elsevier Patient Education  2023 [employer]  

## 2021-08-30 NOTE — Progress Notes (Signed)
Subjective:  Patient ID: Raven [name], female    DOB: [birth year]  Age: 62 y.o. MRN: [id number]  CC: Follow-up (3 month f/u)   HPI Raven [name] presents for f/up -  She saw urologist about 2 weeks ago and tells me that she was placed on an antibiotic but the urologist note does not indicate any prescriptions ordered.  She complains of a 2-week history of rash that is since resolved with nonproductive cough and night sweats.  She struggles with medication compliance.  She is not able to tell me which medications she is taking.  Her recent Lexiscan was normal.  Outpatient Medications Prior to Visit  Medication Sig Dispense Refill   atorvastatin (LIPITOR) 20 MG tablet Take 1 tablet (20 mg total) by mouth daily. 90 tablet 1   potassium chloride SA (KLOR-CON) 20 MEQ tablet TAKE 1 TABLET(20 MEQ) BY MOUTH TWICE DAILY 180 tablet 0   amLODipine (NORVASC) 5 MG tablet TAKE 1 TABLET (5 MG TOTAL) BY MOUTH DAILY. 90 tablet 0   levothyroxine (SYNTHROID) 150 MCG tablet Take 1 tablet (150 mcg total) by mouth daily. 90 tablet 1   ARIPiprazole (ABILIFY) 2 MG tablet TAKE 1 TABLET(2 MG) BY MOUTH DAILY (Patient not taking: Reported on 08/30/2021) 90 tablet 0   aspirin 81 MG EC tablet Take 1 tablet (81 mg total) by mouth daily. Swallow whole. (Patient not taking: Reported on 08/30/2021) 90 tablet 1   bimatoprost (LUMIGAN) 0.01 % SOLN Place 1 drop into both eyes at bedtime. (Patient not taking: Reported on 08/30/2021) 5 mL 0   thiamine 100 MG tablet Take 1 tablet (100 mg total) by mouth daily. (Patient not taking: Reported on 08/30/2021) 90 tablet 0   traZODone (DESYREL) 50 MG tablet TAKE 1 TABLET(50 MG) BY MOUTH AT BEDTIME (Patient not taking: Reported on 08/30/2021) 90 tablet 1   doxazosin (CARDURA) 1 MG tablet TAKE 1 TABLET(1 MG) BY MOUTH TWICE DAILY (Patient not taking: Reported on 08/30/2021) 180 tablet 0   No facility-administered medications prior to visit.    ROS Review of Systems  Constitutional:  Negative.  Negative for chills, diaphoresis, fatigue and fever.  HENT: Negative.    Eyes: Negative.   Respiratory:  Positive for cough. Negative for chest tightness, shortness of breath and wheezing.   Cardiovascular:  Negative for chest pain, palpitations and leg swelling.  Gastrointestinal:  Negative for abdominal pain, constipation, diarrhea, nausea and vomiting.  Endocrine: Negative.   Genitourinary: Negative.  Negative for difficulty urinating, dysuria and urgency.  Musculoskeletal: Negative.   Skin: Negative.  Negative for rash.  Neurological:  Negative for dizziness, weakness, light-headedness and headaches.  Hematological:  Negative for adenopathy. Does not bruise/bleed easily.  Psychiatric/Behavioral: Negative.      Objective:  BP (!) 116/58 (BP Location: Left Arm, Patient Position: Sitting, Cuff Size: Large)   Pulse 67   Temp 98.6 F (37 C) (Oral)   Ht '5\' 8"'$  (1.727 m)   Wt 176 lb (79.8 kg)   LMP  (LMP Unknown)   SpO2 98%   BMI 26.76 kg/m   BP Readings from Last 3 Encounters:  08/30/21 (!) 116/58  05/30/21 136/84  01/18/21 130/80    Wt Readings from Last 3 Encounters:  08/30/21 176 lb (79.8 kg)  08/24/21 183 lb (83 kg)  05/30/21 183 lb (83 kg)    Physical Exam Vitals reviewed.  HENT:     Nose: Nose normal.     Mouth/Throat:     Mouth: Mucous membranes  are moist.  Eyes:     General: No scleral icterus.    Extraocular Movements:     Right eye: Abnormal extraocular motion present.     Left eye: Abnormal extraocular motion present.     Conjunctiva/sclera: Conjunctivae normal.  Cardiovascular:     Rate and Rhythm: Normal rate and regular rhythm.     Heart sounds: No murmur heard. Pulmonary:     Breath sounds: No stridor. No wheezing, rhonchi or rales.  Abdominal:     General: Abdomen is flat.     Palpations: There is no mass.     Tenderness: There is no abdominal tenderness. There is no guarding.     Hernia: No hernia is present.  Musculoskeletal:         General: Normal range of motion.     Cervical back: Neck supple.     Right lower leg: No edema.     Left lower leg: No edema.  Lymphadenopathy:     Cervical: No cervical adenopathy.  Skin:    General: Skin is warm and dry.     Findings: No rash.  Neurological:     General: No focal deficit present.     Mental Status: She is alert. Mental status is at baseline.  Psychiatric:        Attention and Perception: She is inattentive.        Mood and Affect: Mood is anxious. Mood is not depressed. Affect is not flat or tearful.        Speech: Speech is tangential. Speech is not delayed.        Behavior: Behavior normal. Behavior is cooperative.        Thought Content: Thought content normal.     Lab Results  Component Value Date   WBC 5.5 05/30/2021   HGB 12.3 05/30/2021   HCT 36.5 05/30/2021   PLT 234.0 05/30/2021   GLUCOSE 80 08/30/2021   CHOL 228 (H) 05/30/2021   TRIG 151.0 (H) 05/30/2021   HDL 69.40 05/30/2021   LDLCALC 128 (H) 05/30/2021   ALT 14 05/30/2021   AST 26 05/30/2021   NA 137 08/30/2021   K 3.8 08/30/2021   CL 103 08/30/2021   CREATININE 1.42 (H) 08/30/2021   BUN 17 08/30/2021   CO2 28 08/30/2021   TSH 24.99 (H) 08/30/2021   HGBA1C 6.4 08/30/2021   MICROALBUR 1.9 08/30/2021    MM 3D SCREEN BREAST BILATERAL  Result Date: 07/10/2021 CLINICAL DATA:  Screening. EXAM: DIGITAL SCREENING BILATERAL MAMMOGRAM WITH TOMOSYNTHESIS AND CAD TECHNIQUE: Bilateral screening digital craniocaudal and mediolateral oblique mammograms were obtained. Bilateral screening digital breast tomosynthesis was performed. The images were evaluated with computer-aided detection. COMPARISON:  Previous exam(s). ACR Breast Density Category b: There are scattered areas of fibroglandular density. FINDINGS: There are no findings suspicious for malignancy. IMPRESSION: No mammographic evidence of malignancy. A result letter of this screening mammogram will be mailed directly to the patient.  RECOMMENDATION: Screening mammogram in one year. (Code:SM-B-01Y) BI-RADS CATEGORY  1: Negative. Electronically Signed   By: Marin Olp M.D.   On: 07/10/2021 08:50    DG Chest 2 View  Result Date: 08/30/2021 CLINICAL DATA:  Cough EXAM: CHEST - 2 VIEW COMPARISON:  04/06/2018 FINDINGS: The heart size and mediastinal contours are within normal limits. Both lungs are clear. The visualized skeletal structures are unremarkable. Surgical clips are seen in the thyroid bed. IMPRESSION: No active cardiopulmonary disease. Electronically Signed   By: Elmer Picker M.D.   On:  08/30/2021 10:37      Assessment & Plan:   Julanne was seen today for follow-up.  Diagnoses and all orders for this visit:  Type II diabetes mellitus with manifestations ([place])- Her blood sugar is well controlled. -     Basic metabolic panel; Future -     Hemoglobin A1c; Future -     Microalbumin / creatinine urine ratio; Future -     Microalbumin / creatinine urine ratio -     Hemoglobin A1c -     Basic metabolic panel  Acquired hypothyroidism -     TSH; Future -     TSH -     levothyroxine (SYNTHROID) 200 MCG tablet; Take 1 tablet (200 mcg total) by mouth daily.  Thiamine deficiency neuropathy  Hypothyroid myopathy- Her CPK is lower than it was previously but is still mildly elevated.  Her TSH is elevated.  Will increase the dose of T4. -     TSH; Future -     CK; Future -     CK -     TSH  Stage 3a chronic kidney disease ([place])- Her renal function is stable. -     Basic metabolic panel; Future -     Urinalysis, Routine w reflex microscopic; Future -     Urinalysis, Routine w reflex microscopic -     Basic metabolic panel  Night sweats- Labs and chest x-ray are negative for secondary causes. -     RPR; Future -     HIV Antibody (routine testing w rflx); Future -     DG Chest 2 View; Future -     HIV Antibody (routine testing w rflx) -     RPR  Chronic cough -     HIV Antibody (routine testing w rflx);  Future -     DG Chest 2 View; Future -     HIV Antibody (routine testing w rflx)  Need for vaccination -     Pneumococcal conjugate vaccine 20-valent (Prevnar 20)  Open-angle glaucoma of both eyes, mild stage, unspecified open-angle glaucoma type -     Ambulatory referral to Ophthalmology   I have discontinued Hassan Rowan L. [place]'s doxazosin, levothyroxine, and amLODipine. I am also having her start on levothyroxine. Additionally, I am having her maintain her bimatoprost, traZODone, potassium chloride SA, ARIPiprazole, thiamine, atorvastatin, and aspirin EC.  Meds ordered this encounter  Medications   levothyroxine (SYNTHROID) 200 MCG tablet    Sig: Take 1 tablet (200 mcg total) by mouth daily.    Dispense:  90 tablet    Refill:  0     Follow-up: Return in about 3 months (around 11/30/2021).  Scarlette Calico, MD

## 2021-08-31 LAB — RPR: RPR Ser Ql: NONREACTIVE

## 2021-08-31 LAB — HIV ANTIBODY (ROUTINE TESTING W REFLEX): HIV 1&2 Ab, 4th Generation: NONREACTIVE

## 2021-09-01 MED ORDER — LEVOTHYROXINE SODIUM 200 MCG PO TABS: 200.0000 ug | ORAL_TABLET | Freq: Every day | ORAL | 0 refills | Status: DC

## 2021-09-03 ENCOUNTER — Encounter: Payer: Self-pay | Admitting: Internal Medicine

## 2021-09-12 ENCOUNTER — Telehealth: Payer: Self-pay | Admitting: Internal Medicine

## 2021-09-12 NOTE — Telephone Encounter (Signed)
Raven [name] with [facility] calls today in regards to recent referral for PT. When attempting to call PT to ask her who she had been seeing for eye care she was yelled at by PT and informed to never call her back.

## 2021-09-25 ENCOUNTER — Telehealth: Payer: Self-pay | Admitting: Internal Medicine

## 2021-09-25 ENCOUNTER — Other Ambulatory Visit: Payer: Self-pay | Admitting: Internal Medicine

## 2021-09-25 DIAGNOSIS — F333 Major depressive disorder, recurrent, severe with psychotic symptoms: Secondary | ICD-10-CM

## 2021-09-25 DIAGNOSIS — F5101 Primary insomnia: Secondary | ICD-10-CM

## 2021-09-25 MED ORDER — TRAZODONE HCL 50 MG PO TABS: ORAL_TABLET | ORAL | 0 refills | Status: DC

## 2021-09-25 NOTE — Telephone Encounter (Signed)
Tried scheduling OV for pt. First available is 10/15/21 and pt stated she can't wait that [facility] for advice. She is requesting a callback from nurse. Pt said "have the nurse to call her" and then she hung up. Pt is agitated and easily angered.     Please advise

## 2021-09-25 NOTE — Telephone Encounter (Signed)
Pt is requesting a callback. Pt stated her levothyroxine (SYNTHROID) 200 MCG tablet dosage was increased from '150MG'$  to '200MG'$ . Pt stated she thinks that is causing her to be emotionally unable. She said she is easily agitated. Pt stated she in unable to express her thoughts without getting frustrated. She is getting headaches. Pt also stated she has not used cocaine in 25 days.  Pt would like some advice.  She also stated she would like some traZODone (DESYREL) 50 MG tablet to help her sleep. Pt stated she requested it at last appt even though it states pt not taking in her chart.    Please advise   CB: [phone]

## 2021-09-26 NOTE — Telephone Encounter (Signed)
Noted  

## 2021-09-26 NOTE — Telephone Encounter (Signed)
I scheduled pt for 10/03/21 920. Pt has to use medicaid transportation.

## 2021-09-30 ENCOUNTER — Other Ambulatory Visit: Payer: Self-pay

## 2021-09-30 ENCOUNTER — Emergency Department (HOSPITAL_COMMUNITY)
Admission: EM | Admit: 2021-09-30 | Discharge: 2021-09-30 | Disposition: A | Discharge status: Home / Self Care | Payer: Medicare Other | Attending: Emergency Medicine | Admitting: Emergency Medicine

## 2021-09-30 ENCOUNTER — Encounter (HOSPITAL_COMMUNITY): Payer: Self-pay

## 2021-09-30 DIAGNOSIS — Z7982 Long term (current) use of aspirin: Secondary | ICD-10-CM | POA: Diagnosis not present

## 2021-09-30 DIAGNOSIS — S39012A Strain of muscle, fascia and tendon of lower back, initial encounter: Secondary | ICD-10-CM | POA: Insufficient documentation

## 2021-09-30 DIAGNOSIS — W108XXA Fall (on) (from) other stairs and steps, initial encounter: Secondary | ICD-10-CM | POA: Insufficient documentation

## 2021-09-30 DIAGNOSIS — Z79899 Other long term (current) drug therapy: Secondary | ICD-10-CM | POA: Insufficient documentation

## 2021-09-30 DIAGNOSIS — S3992XA Unspecified injury of lower back, initial encounter: Secondary | ICD-10-CM | POA: Diagnosis present

## 2021-09-30 MED ORDER — IBUPROFEN 600 MG PO TABS: 600.0000 mg | ORAL_TABLET | Freq: Three times a day (TID) | ORAL | 0 refills | Status: DC | PRN

## 2021-09-30 MED ORDER — CYCLOBENZAPRINE HCL 10 MG PO TABS: 10.0000 mg | ORAL_TABLET | Freq: Two times a day (BID) | ORAL | 0 refills | Status: DC | PRN

## 2021-09-30 MED ORDER — IBUPROFEN 200 MG PO TABS
600.0000 mg | ORAL_TABLET | Freq: Once | ORAL | Status: AC
  Administered 2021-09-30: 600 mg via ORAL
  Filled 2021-09-30: qty 3

## 2021-09-30 MED ORDER — CYCLOBENZAPRINE HCL 10 MG PO TABS
10.0000 mg | ORAL_TABLET | Freq: Once | ORAL | Status: AC
  Administered 2021-09-30: 10 mg via ORAL
  Filled 2021-09-30: qty 1
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
## 2021-09-30 NOTE — ED Notes (Signed)
Provided with a bus pass and ginger ale.

## 2021-10-03 ENCOUNTER — Ambulatory Visit (INDEPENDENT_AMBULATORY_CARE_PROVIDER_SITE_OTHER): Payer: Medicare Other | Admitting: Internal Medicine

## 2021-10-03 ENCOUNTER — Encounter: Payer: Self-pay | Admitting: Internal Medicine

## 2021-10-03 VITALS — BP 120/68 | HR 76 | Temp 97.9°F | Ht 67.75 in | Wt 182.2 lb

## 2021-10-03 DIAGNOSIS — E118 Type 2 diabetes mellitus with unspecified complications: Secondary | ICD-10-CM

## 2021-10-03 DIAGNOSIS — I10 Essential (primary) hypertension: Secondary | ICD-10-CM | POA: Diagnosis not present

## 2021-10-03 DIAGNOSIS — F1410 Cocaine abuse, uncomplicated: Secondary | ICD-10-CM | POA: Diagnosis not present

## 2021-10-03 DIAGNOSIS — Z1211 Encounter for screening for malignant neoplasm of colon: Secondary | ICD-10-CM

## 2021-10-03 DIAGNOSIS — Z0001 Encounter for general adult medical examination with abnormal findings: Secondary | ICD-10-CM | POA: Diagnosis not present

## 2021-10-03 DIAGNOSIS — E039 Hypothyroidism, unspecified: Secondary | ICD-10-CM | POA: Diagnosis not present

## 2021-10-03 DIAGNOSIS — Z23 Encounter for immunization: Secondary | ICD-10-CM | POA: Diagnosis not present

## 2021-10-03 DIAGNOSIS — N1831 Chronic kidney disease, stage 3a: Secondary | ICD-10-CM | POA: Diagnosis not present

## 2021-10-03 LAB — MICROALBUMIN / CREATININE URINE RATIO
Creatinine,U: 80.6 mg/dL
Microalb Creat Ratio: 0.9 mg/g (ref 0.0–30.0)
Microalb, Ur: <0.7 mg/dL (ref 0.0–1.9)

## 2021-10-03 LAB — URINALYSIS, ROUTINE W REFLEX MICROSCOPIC
Bilirubin Urine: NEGATIVE
Hgb urine dipstick: NEGATIVE
Ketones, ur: NEGATIVE
Leukocytes,Ua: NEGATIVE
Nitrite: NEGATIVE
RBC / HPF: NONE SEEN (ref 0–?)
Specific Gravity, Urine: 1.02 (ref 1.000–1.030)
Total Protein, Urine: NEGATIVE
Urine Glucose: NEGATIVE
Urobilinogen, UA: 0.2 (ref 0.0–1.0)
WBC, UA: NONE SEEN (ref 0–?)
pH: 6 (ref 5.0–8.0)

## 2021-10-03 LAB — BASIC METABOLIC PANEL
BUN: 19 mg/dL (ref 6–23)
CO2: 26 mEq/L (ref 19–32)
Calcium: 9.3 mg/dL (ref 8.4–10.5)
Chloride: 105 mEq/L (ref 96–112)
Creatinine, Ser: 1.17 mg/dL (ref 0.40–1.20)
GFR: 50.66 mL/min — ABNORMAL LOW (ref >60.00)
Glucose, Bld: 73 mg/dL (ref 70–99)
Potassium: 4 mEq/L (ref 3.5–5.1)
Sodium: 139 mEq/L (ref 135–145)

## 2021-10-03 LAB — TSH: TSH: 0.28 u[IU]/mL — ABNORMAL LOW (ref 0.35–5.50)

## 2021-10-03 NOTE — Patient Instructions (Signed)

## 2021-10-03 NOTE — Progress Notes (Signed)
Subjective:  Patient ID: Raven [name], female    DOB: 1960-12-11  Age: 61 y.o. MRN: [id number]  CC: Annual Exam, Hypothyroidism, Hyperlipidemia, and Diabetes   HPI Raven [name] presents for a CPX and f/up -  She complains of declining memory.  She is active and denies chest pain, shortness of breath, or edema.  She is being treated for musculoskeletal pain.  Outpatient Medications Prior to Visit  Medication Sig Dispense Refill   aspirin 81 MG EC tablet Take 1 tablet (81 mg total) by mouth daily. Swallow whole. 90 tablet 1   atorvastatin (LIPITOR) 20 MG tablet Take 1 tablet (20 mg total) by mouth daily. 90 tablet 1   traZODone (DESYREL) 50 MG tablet TAKE 1 TABLET(50 MG) BY MOUTH AT BEDTIME 90 tablet 0   cyclobenzaprine (FLEXERIL) 10 MG tablet Take 1 tablet (10 mg total) by mouth 2 (two) times daily as needed for up to 14 doses for muscle spasms. 14 tablet 0   ibuprofen (ADVIL) 600 MG tablet Take 1 tablet (600 mg total) by mouth every 8 (eight) hours as needed for up to 21 doses for moderate pain or mild pain. 21 tablet 0   levothyroxine (SYNTHROID) 200 MCG tablet Take 1 tablet (200 mcg total) by mouth daily. 90 tablet 0   bimatoprost (LUMIGAN) 0.01 % SOLN Place 1 drop into both eyes at bedtime. (Patient not taking: Reported on 08/30/2021) 5 mL 0   thiamine 100 MG tablet Take 1 tablet (100 mg total) by mouth daily. (Patient not taking: Reported on 08/30/2021) 90 tablet 0   ARIPiprazole (ABILIFY) 2 MG tablet TAKE 1 TABLET(2 MG) BY MOUTH DAILY (Patient not taking: Reported on 08/30/2021) 90 tablet 0   potassium chloride SA (KLOR-CON) 20 MEQ tablet TAKE 1 TABLET(20 MEQ) BY MOUTH TWICE DAILY (Patient not taking: Reported on 10/03/2021) 180 tablet 0   No facility-administered medications prior to visit.    ROS Review of Systems  Constitutional: Negative.  Negative for diaphoresis and fatigue.  HENT: Negative.    Eyes: Negative.   Respiratory: Negative.  Negative for cough and shortness of  breath.   Cardiovascular:  Negative for chest pain, palpitations and leg swelling.  Gastrointestinal:  Negative for abdominal pain, constipation, diarrhea, nausea and vomiting.  Endocrine: Negative.   Genitourinary: Negative.   Musculoskeletal:  Positive for arthralgias and back pain. Negative for joint swelling and myalgias.  Skin: Negative.  Negative for color change and pallor.  Neurological: Negative.  Negative for dizziness and weakness.  Hematological:  Negative for adenopathy. Does not bruise/bleed easily.  Psychiatric/Behavioral:  Positive for decreased concentration and dysphoric mood. Negative for agitation, behavioral problems, confusion, self-injury, sleep disturbance and suicidal ideas. The patient is not nervous/anxious.     Objective:  BP 120/68 (BP Location: Left Arm, Patient Position: Sitting, Cuff Size: Large)   Pulse 76   Temp 97.9 F (36.6 C) (Oral)   Ht 5' 7.75" (1.721 m)   Wt 182 lb 3.2 oz (82.6 kg)   LMP  (LMP Unknown)   SpO2 99%   BMI 27.91 kg/m   BP Readings from Last 3 Encounters:  10/03/21 120/68  09/30/21 (!) 160/90  08/30/21 (!) 116/58    Wt Readings from Last 3 Encounters:  10/03/21 182 lb 3.2 oz (82.6 kg)  09/30/21 189 lb (85.7 kg)  08/30/21 176 lb (79.8 kg)    Physical Exam Vitals reviewed.  HENT:     Nose: Nose normal.     Mouth/Throat:  Mouth: Mucous membranes are moist.  Eyes:     General: No scleral icterus.    Conjunctiva/sclera: Conjunctivae normal.  Cardiovascular:     Rate and Rhythm: Normal rate and regular rhythm.     Heart sounds: No murmur heard. Pulmonary:     Effort: Pulmonary effort is normal.     Breath sounds: No stridor. No wheezing, rhonchi or rales.  Abdominal:     General: Abdomen is flat.     Palpations: There is no mass.     Tenderness: There is no abdominal tenderness. There is no guarding.     Hernia: No hernia is present.  Musculoskeletal:        General: Normal range of motion.     Cervical back:  Neck supple.     Right lower leg: No edema.     Left lower leg: No edema.  Lymphadenopathy:     Cervical: No cervical adenopathy.  Skin:    General: Skin is warm and dry.  Neurological:     General: No focal deficit present.     Mental Status: She is alert.  Psychiatric:        Attention and Perception: She is inattentive.        Mood and Affect: Mood normal. Mood is not anxious. Affect is blunt and inappropriate. Affect is not tearful.        Speech: She is communicative. Speech is tangential. Speech is not delayed.        Behavior: Behavior normal. Behavior is cooperative.        Thought Content: Thought content normal.     Lab Results  Component Value Date   WBC 5.5 05/30/2021   HGB 12.3 05/30/2021   HCT 36.5 05/30/2021   PLT 234.0 05/30/2021   GLUCOSE 73 10/03/2021   CHOL 228 (H) 05/30/2021   TRIG 151.0 (H) 05/30/2021   HDL 69.40 05/30/2021   LDLCALC 128 (H) 05/30/2021   ALT 14 05/30/2021   AST 26 05/30/2021   NA 139 10/03/2021   K 4.0 10/03/2021   CL 105 10/03/2021   CREATININE 1.17 10/03/2021   BUN 19 10/03/2021   CO2 26 10/03/2021   TSH 0.28 (L) 10/03/2021   HGBA1C 6.4 08/30/2021   MICROALBUR <0.7 10/03/2021    No results found.  Assessment & Plan:   Raven [name] was seen today for annual exam, hypothyroidism, hyperlipidemia and diabetes.  Diagnoses and all orders for this visit:  Acquired hypothyroidism- Her TSH is suppressed.  Will lower her T4 dosage. -     TSH; Future -     TSH -     levothyroxine (SYNTHROID) 175 MCG tablet; Take 1 tablet (175 mcg total) by mouth daily.  Type II diabetes mellitus with manifestations ([place])- Her blood sugar is adequately well controlled. -     HM Diabetes Foot Exam -     Basic metabolic panel; Future -     Basic metabolic panel  Stage 3a chronic kidney disease ([place])- Will discontinue the NSAIDs. -     Urinalysis, Routine w reflex microscopic; Future -     Microalbumin / creatinine urine ratio; Future -     Urine  drugs of abuse scrn w alc, routine (Ref Lab); Future -     Basic metabolic panel; Future -     Basic metabolic panel -     Urine drugs of abuse scrn w alc, routine (Ref Lab) -     Microalbumin / creatinine urine ratio -  Urinalysis, Routine w reflex microscopic  Cocaine abuse ([place])- I think this explains her memory loss.  I have asked her to consider treatment options. -     Urine drugs of abuse scrn w alc, routine (Ref Lab); Future -     Urine drugs of abuse scrn w alc, routine (Ref Lab)  Primary hypertension- Her blood pressure is adequately well controlled. -     Urine drugs of abuse scrn w alc, routine (Ref Lab); Future -     Basic metabolic panel; Future -     Basic metabolic panel -     Urine drugs of abuse scrn w alc, routine (Ref Lab)  Need for pneumococcal vaccine -     Pneumococcal conjugate vaccine 20-valent (Prevnar 20)  Other orders -     Cocaine Con, Ur   I have discontinued Hassan Rowan L. [place]'s potassium chloride SA, ARIPiprazole, levothyroxine, cyclobenzaprine, and ibuprofen. I am also having her start on levothyroxine. Additionally, I am having her maintain her bimatoprost, thiamine, atorvastatin, aspirin EC, and traZODone.  Meds ordered this encounter  Medications   levothyroxine (SYNTHROID) 175 MCG tablet    Sig: Take 1 tablet (175 mcg total) by mouth daily.    Dispense:  90 tablet    Refill:  1     Follow-up: Return in about 3 months (around 01/03/2022).  Scarlette Calico, MD

## 2021-10-06 LAB — URINE DRUGS OF ABUSE SCREEN W ALC, ROUTINE (REF LAB)
Amphetamines, Urine: NEGATIVE ng/mL
Barbiturate Quant, Ur: NEGATIVE ng/mL
Benzodiazepine Quant, Ur: NEGATIVE ng/mL
Cannabinoid Quant, Ur: NEGATIVE ng/mL
Ethanol, Urine: NEGATIVE %
Methadone Screen, Urine: NEGATIVE ng/mL
Opiate Quant, Ur: NEGATIVE ng/mL
PCP Quant, Ur: NEGATIVE ng/mL
Propoxyphene: NEGATIVE ng/mL

## 2021-10-06 LAB — COCAINE CONF, UR
Benzoylecgonine GC/MS Conf: 305 ng/mL
Cocaine Metab Quant, Ur: POSITIVE — AB

## 2021-10-07 ENCOUNTER — Encounter: Payer: Self-pay | Admitting: Internal Medicine

## 2021-10-07 DIAGNOSIS — Z0001 Encounter for general adult medical examination with abnormal findings: Secondary | ICD-10-CM | POA: Insufficient documentation

## 2021-10-07 DIAGNOSIS — Z1211 Encounter for screening for malignant neoplasm of colon: Secondary | ICD-10-CM | POA: Insufficient documentation

## 2021-10-07 DIAGNOSIS — Z23 Encounter for immunization: Secondary | ICD-10-CM | POA: Insufficient documentation

## 2021-10-07 MED ORDER — LEVOTHYROXINE SODIUM 175 MCG PO TABS: 175.0000 ug | ORAL_TABLET | Freq: Every day | ORAL | 1 refills | Status: AC

## 2021-10-07 MED ORDER — SHINGRIX 50 MCG/0.5ML IM SUSR: 0.5000 mL | Freq: Once | INTRAMUSCULAR | 1 refills | Status: AC

## 2021-10-07 MED ORDER — BOOSTRIX 5-2.5-18.5 LF-MCG/0.5 IM SUSP: 0.5000 mL | Freq: Once | INTRAMUSCULAR | 0 refills | Status: AC

## 2021-10-07 NOTE — Assessment & Plan Note (Signed)
Exam completed Labs reviewed Vaccines reviewed and updated Cancer screenings addressed Patient education was given. 

## 2021-10-11 ENCOUNTER — Telehealth: Payer: Self-pay

## 2021-10-11 NOTE — Telephone Encounter (Signed)
     Patient  visit on 7/30  at [facility] was for back pain  Have you been able to follow up with your primary care physician?yes  The patient was or was not able to obtain any needed medicine or equipment.yes  Are there diet recommendations that you are having difficulty following?na  Patient expresses understanding of discharge instructions and education provided has no other needs at this time. yes     [facility]  [phone] [address], [place], [place] [address] Phone: [phone] Email: [email or web address]

## 2021-10-12 DIAGNOSIS — H401130 Primary open-angle glaucoma, bilateral, stage unspecified: Secondary | ICD-10-CM | POA: Diagnosis not present

## 2021-10-23 ENCOUNTER — Encounter (HOSPITAL_BASED_OUTPATIENT_CLINIC_OR_DEPARTMENT_OTHER): Payer: Medicare Other | Admitting: Advanced Practice Midwife

## 2021-10-25 ENCOUNTER — Telehealth: Payer: Self-pay

## 2021-10-25 NOTE — Telephone Encounter (Signed)
Pt is requesting a cb in regards to the letter that was sent out with lab results. Pt says she has questions about the specific results.  Please advise

## 2021-10-29 NOTE — Telephone Encounter (Signed)
Called pt, LVM.   

## 2021-11-06 ENCOUNTER — Other Ambulatory Visit: Payer: Self-pay | Admitting: Internal Medicine

## 2021-11-06 ENCOUNTER — Telehealth: Payer: Self-pay | Admitting: Internal Medicine

## 2021-11-06 DIAGNOSIS — G8929 Other chronic pain: Secondary | ICD-10-CM

## 2021-11-06 DIAGNOSIS — F333 Major depressive disorder, recurrent, severe with psychotic symptoms: Secondary | ICD-10-CM

## 2021-11-06 DIAGNOSIS — F5101 Primary insomnia: Secondary | ICD-10-CM

## 2021-11-06 MED ORDER — CYCLOBENZAPRINE HCL 5 MG PO TABS: 5.0000 mg | ORAL_TABLET | Freq: Three times a day (TID) | ORAL | 0 refills | Status: DC | PRN

## 2021-11-06 MED ORDER — TRAZODONE HCL 50 MG PO TABS: ORAL_TABLET | ORAL | 0 refills | Status: DC

## 2021-11-06 NOTE — Telephone Encounter (Signed)
Pt is requesting a refill of her traZODone (DESYREL) 50 MG tablet and her cyclobenzaprine (FLEXERIL) 10 MG tablet which she got from the [facility] [facility].   Please send RX to [id number]  Phone:  [phone] Fax:  [phone]

## 2021-11-06 NOTE — Telephone Encounter (Signed)
Patient would like for Dr. Ronnald Ramp nurse to give her a call concerning this.

## 2021-11-06 NOTE — Telephone Encounter (Signed)
Pt stated per lab letter she was instructed to avoid NSAID's and ibuprofen for her pain. Pt would like to know if flexeril could be sent to the pharmacy to help as well as tylenol '500mg'$ . Please advise.

## 2021-11-15 DIAGNOSIS — E559 Vitamin D deficiency, unspecified: Secondary | ICD-10-CM | POA: Diagnosis not present

## 2021-11-15 DIAGNOSIS — E1140 Type 2 diabetes mellitus with diabetic neuropathy, unspecified: Secondary | ICD-10-CM | POA: Diagnosis not present

## 2021-11-15 DIAGNOSIS — Z85850 Personal history of malignant neoplasm of thyroid: Secondary | ICD-10-CM | POA: Diagnosis not present

## 2021-11-15 DIAGNOSIS — N1831 Chronic kidney disease, stage 3a: Secondary | ICD-10-CM | POA: Diagnosis not present

## 2021-11-15 DIAGNOSIS — M549 Dorsalgia, unspecified: Secondary | ICD-10-CM | POA: Diagnosis not present

## 2021-11-15 DIAGNOSIS — N189 Chronic kidney disease, unspecified: Secondary | ICD-10-CM | POA: Diagnosis not present

## 2021-11-15 DIAGNOSIS — Z79899 Other long term (current) drug therapy: Secondary | ICD-10-CM | POA: Diagnosis not present

## 2021-11-15 DIAGNOSIS — H409 Unspecified glaucoma: Secondary | ICD-10-CM | POA: Diagnosis not present

## 2021-11-15 DIAGNOSIS — F17210 Nicotine dependence, cigarettes, uncomplicated: Secondary | ICD-10-CM | POA: Diagnosis not present

## 2021-11-23 ENCOUNTER — Emergency Department (HOSPITAL_COMMUNITY)
Admission: EM | Admit: 2021-11-23 | Discharge: 2021-11-23 | Disposition: A | Discharge status: Home / Self Care | Payer: Medicare Other | Attending: Emergency Medicine | Admitting: Emergency Medicine

## 2021-11-23 ENCOUNTER — Encounter (HOSPITAL_COMMUNITY): Payer: Self-pay

## 2021-11-23 ENCOUNTER — Other Ambulatory Visit: Payer: Self-pay | Admitting: Internal Medicine

## 2021-11-23 ENCOUNTER — Other Ambulatory Visit: Payer: Self-pay

## 2021-11-23 DIAGNOSIS — Z79899 Other long term (current) drug therapy: Secondary | ICD-10-CM | POA: Insufficient documentation

## 2021-11-23 DIAGNOSIS — E039 Hypothyroidism, unspecified: Secondary | ICD-10-CM

## 2021-11-23 DIAGNOSIS — Z7982 Long term (current) use of aspirin: Secondary | ICD-10-CM | POA: Insufficient documentation

## 2021-11-23 DIAGNOSIS — E119 Type 2 diabetes mellitus without complications: Secondary | ICD-10-CM | POA: Diagnosis not present

## 2021-11-23 DIAGNOSIS — M5459 Other low back pain: Secondary | ICD-10-CM | POA: Diagnosis not present

## 2021-11-23 DIAGNOSIS — M545 Low back pain, unspecified: Secondary | ICD-10-CM | POA: Insufficient documentation

## 2021-11-23 DIAGNOSIS — I10 Essential (primary) hypertension: Secondary | ICD-10-CM

## 2021-11-23 MED ORDER — PREDNISONE 10 MG (21) PO TBPK: ORAL_TABLET | Freq: Every day | ORAL | 0 refills | Status: DC

## 2021-11-23 MED ORDER — OXYCODONE-ACETAMINOPHEN 5-325 MG PO TABS
1.0000 | ORAL_TABLET | Freq: Once | ORAL | Status: AC
  Administered 2021-11-23: 1 via ORAL
  Filled 2021-11-23: qty 1
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
## 2021-11-23 NOTE — Discharge Instructions (Signed)
You were seen today for continued low back pain of the left side.  You were administered a Percocet while in the office and had been prescribed a steroid taper back.  Please take the steroid pack as prescribed.  Please remember that this may elevate your glucose readings at home.  If your glucose readings begin to read high, I recommend follow-up with primary care, or if you develop any life-threatening symptoms follow-up with the emergency department.  Please keep your outpatient appointment as scheduled for next week.
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
## 2021-11-28 DIAGNOSIS — Z0001 Encounter for general adult medical examination with abnormal findings: Secondary | ICD-10-CM | POA: Diagnosis not present

## 2021-11-28 DIAGNOSIS — Z0000 Encounter for general adult medical examination without abnormal findings: Secondary | ICD-10-CM | POA: Diagnosis not present

## 2021-11-28 DIAGNOSIS — F17210 Nicotine dependence, cigarettes, uncomplicated: Secondary | ICD-10-CM | POA: Diagnosis not present

## 2021-11-28 DIAGNOSIS — Z79899 Other long term (current) drug therapy: Secondary | ICD-10-CM | POA: Diagnosis not present

## 2021-11-28 DIAGNOSIS — E785 Hyperlipidemia, unspecified: Secondary | ICD-10-CM | POA: Diagnosis not present

## 2021-11-28 DIAGNOSIS — E039 Hypothyroidism, unspecified: Secondary | ICD-10-CM | POA: Diagnosis not present

## 2021-11-28 DIAGNOSIS — I700 Atherosclerosis of aorta: Secondary | ICD-10-CM | POA: Diagnosis not present

## 2021-12-05 ENCOUNTER — Ambulatory Visit: Payer: Medicare Other | Admitting: Internal Medicine

## 2021-12-07 ENCOUNTER — Other Ambulatory Visit: Payer: Self-pay | Admitting: Internal Medicine

## 2021-12-07 DIAGNOSIS — G8929 Other chronic pain: Secondary | ICD-10-CM

## 2021-12-07 DIAGNOSIS — E785 Hyperlipidemia, unspecified: Secondary | ICD-10-CM

## 2021-12-07 DIAGNOSIS — I10 Essential (primary) hypertension: Secondary | ICD-10-CM

## 2021-12-07 DIAGNOSIS — M545 Low back pain, unspecified: Secondary | ICD-10-CM

## 2021-12-12 ENCOUNTER — Other Ambulatory Visit: Payer: Self-pay

## 2021-12-12 ENCOUNTER — Encounter (HOSPITAL_COMMUNITY): Payer: Self-pay

## 2021-12-12 ENCOUNTER — Emergency Department (HOSPITAL_COMMUNITY)
Admission: EM | Admit: 2021-12-12 | Discharge: 2021-12-12 | Disposition: A | Discharge status: Home / Self Care | Payer: Medicare Other | Attending: Emergency Medicine | Admitting: Emergency Medicine

## 2021-12-12 DIAGNOSIS — Y92410 Unspecified street and highway as the place of occurrence of the external cause: Secondary | ICD-10-CM | POA: Insufficient documentation

## 2021-12-12 DIAGNOSIS — S39012A Strain of muscle, fascia and tendon of lower back, initial encounter: Secondary | ICD-10-CM | POA: Insufficient documentation

## 2021-12-12 DIAGNOSIS — S3992XA Unspecified injury of lower back, initial encounter: Secondary | ICD-10-CM | POA: Diagnosis present

## 2021-12-12 HISTORY — DX: Malignant neoplasm of unspecified ovary: C56.9

## 2021-12-12 MED ORDER — OXYCODONE-ACETAMINOPHEN 5-325 MG PO TABS
1.0000 | ORAL_TABLET | Freq: Once | ORAL | Status: AC
  Administered 2021-12-12: 1 via ORAL
  Filled 2021-12-12: qty 1

## 2021-12-12 MED ORDER — OXYCODONE-ACETAMINOPHEN 5-325 MG PO TABS: 1.0000 | ORAL_TABLET | Freq: Four times a day (QID) | ORAL | 0 refills | Status: DC | PRN

## 2021-12-12 MED ORDER — PREDNISONE 20 MG PO TABS: 40.0000 mg | ORAL_TABLET | Freq: Every day | ORAL | 0 refills | Status: DC

## 2021-12-12 MED ORDER — PREDNISONE 20 MG PO TABS
60.0000 mg | ORAL_TABLET | ORAL | Status: AC
  Administered 2021-12-12: 60 mg via ORAL
  Filled 2021-12-12: qty 3

## 2021-12-12 NOTE — Discharge Instructions (Signed)
It is very important that you keep your appointment as scheduled for later this month with your back specialist.  Return here for concerning changes in your condition.
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
## 2021-12-21 ENCOUNTER — Encounter (HOSPITAL_COMMUNITY): Payer: Self-pay

## 2021-12-21 ENCOUNTER — Emergency Department (HOSPITAL_COMMUNITY)
Admission: EM | Admit: 2021-12-21 | Discharge: 2021-12-21 | Disposition: A | Discharge status: Home / Self Care | Payer: Medicare Other | Attending: Emergency Medicine | Admitting: Emergency Medicine

## 2021-12-21 ENCOUNTER — Other Ambulatory Visit: Payer: Self-pay

## 2021-12-21 DIAGNOSIS — M5442 Lumbago with sciatica, left side: Secondary | ICD-10-CM | POA: Diagnosis present

## 2021-12-21 DIAGNOSIS — Z7982 Long term (current) use of aspirin: Secondary | ICD-10-CM | POA: Diagnosis not present

## 2021-12-21 DIAGNOSIS — M5440 Lumbago with sciatica, unspecified side: Secondary | ICD-10-CM

## 2021-12-21 MED ORDER — PREDNISONE 20 MG PO TABS
60.0000 mg | ORAL_TABLET | Freq: Once | ORAL | Status: AC
  Administered 2021-12-21: 60 mg via ORAL
  Filled 2021-12-21: qty 3

## 2021-12-21 MED ORDER — PREDNISONE 20 MG PO TABS: ORAL_TABLET | ORAL | 0 refills | Status: AC

## 2021-12-21 MED ORDER — OXYCODONE-ACETAMINOPHEN 5-325 MG PO TABS: 1.0000 | ORAL_TABLET | Freq: Four times a day (QID) | ORAL | 0 refills | Status: DC | PRN

## 2021-12-21 MED ORDER — OXYCODONE-ACETAMINOPHEN 5-325 MG PO TABS
1.0000 | ORAL_TABLET | Freq: Once | ORAL | Status: AC
  Administered 2021-12-21: 1 via ORAL
  Filled 2021-12-21: qty 1

## 2021-12-21 NOTE — ED Provider Notes (Signed)
[facility] DEPT Provider Note   CSN: [id number] Arrival date & time: 12/21/21  1017     History  Chief Complaint  Patient presents with   Back Pain    ELBERTA LACHAPELLE is a 61 y.o. female.  Pt c/o left low back pain that occasionally radiates to LLE. States symptoms recurrently in past several months. No recent injury.  No saddle area or leg numbness. No weakness. No urinary retention or incontinence. No fever or chills.   The history is provided by the patient.  Back Pain Associated symptoms: no chest pain, no fever, no numbness and no weakness        Home Medications Prior to Admission medications   Medication Sig Start Date End Date Taking? Authorizing Provider  amLODipine (NORVASC) 5 MG tablet TAKE 1 TABLET (5 MG TOTAL) BY MOUTH DAILY. 12/07/21   Janith Lima, MD  aspirin 81 MG EC tablet Take 1 tablet (81 mg total) by mouth daily. Swallow whole. 05/31/21   Janith Lima, MD  atorvastatin (LIPITOR) 20 MG tablet TAKE 1 TABLET BY MOUTH EVERY DAY 12/07/21   Janith Lima, MD  bimatoprost (LUMIGAN) 0.01 % SOLN Place 1 drop into both eyes at bedtime. Patient not taking: Reported on 08/30/2021 10/14/17   Nuala Alpha, MD  cyclobenzaprine (FLEXERIL) 5 MG tablet TAKE 1 TABLET BY MOUTH THREE TIMES A DAY AS NEEDED FOR MUSCLE SPASMS 12/07/21   Janith Lima, MD  levothyroxine (SYNTHROID) 175 MCG tablet Take 1 tablet (175 mcg total) by mouth daily. 10/07/21   Janith Lima, MD  oxyCODONE-acetaminophen (PERCOCET/ROXICET) 5-325 MG tablet Take 1 tablet by mouth every 6 (six) hours as needed for severe pain. 12/12/21   Carmin Muskrat, MD  predniSONE (DELTASONE) 20 MG tablet Take 2 tablets (40 mg total) by mouth daily with breakfast. For the next four days 12/12/21   Carmin Muskrat, MD  thiamine 100 MG tablet Take 1 tablet (100 mg total) by mouth daily. Patient not taking: Reported on 08/30/2021 05/30/21   Janith Lima, MD  traZODone (DESYREL) 50 MG  tablet TAKE 1 TABLET(50 MG) BY MOUTH AT BEDTIME 11/06/21   Janith Lima, MD      Allergies    Patient has no known allergies.    Review of Systems   Review of Systems  Constitutional:  Negative for chills and fever.  Respiratory:  Negative for shortness of breath.   Cardiovascular:  Negative for chest pain.  Gastrointestinal:  Negative for diarrhea and vomiting.  Genitourinary:  Negative for difficulty urinating.  Musculoskeletal:  Positive for back pain.  Skin:  Negative for rash.  Neurological:  Negative for weakness and numbness.    Physical Exam Updated Vital Signs BP 134/64   Pulse 89   Temp 98.6 F (37 C) (Oral)   Resp 16   Ht 1.702 m ('5\' 7"'$ )   Wt 85.7 kg   LMP  (LMP Unknown)   SpO2 98%   BMI 29.60 kg/m  Physical Exam Vitals and nursing note reviewed.  Constitutional:      Appearance: Normal appearance. She is well-developed.  HENT:     Head: Atraumatic.     Nose: Nose normal.     Mouth/Throat:     Mouth: Mucous membranes are moist.  Eyes:     General: No scleral icterus.    Conjunctiva/sclera: Conjunctivae normal.  Neck:     Trachea: No tracheal deviation.  Cardiovascular:     Rate and Rhythm:  Normal rate and regular rhythm.     Pulses: Normal pulses.  Pulmonary:     Effort: Pulmonary effort is normal. No respiratory distress.  Abdominal:     General: There is no distension.     Tenderness: There is no abdominal tenderness.  Genitourinary:    Comments: No cva tenderness.  Musculoskeletal:        General: No swelling.     Cervical back: Normal range of motion and neck supple. No rigidity. No muscular tenderness.     Comments: LS spine non tender, aligned. Good passive rom at left hip without pain. No sts or skin lesions/rash in area of pain. No LLE edema. Distal pulses palp.   Skin:    General: Skin is warm and dry.     Findings: No rash.  Neurological:     Mental Status: She is alert.     Comments: Alert, speech normal. LLE nvi, motor 5/5, sens  intact. Reflexes 2+.   Psychiatric:        Mood and Affect: Mood normal.     ED Results / Procedures / Treatments   Labs (all labs ordered are listed, but only abnormal results are displayed) Labs Reviewed - No data to display  EKG None  Radiology No results found.  Procedures Procedures    Medications Ordered in ED Medications  oxyCODONE-acetaminophen (PERCOCET/ROXICET) 5-325 MG per tablet 1 tablet (1 tablet Oral Given 12/21/21 1057)  predniSONE (DELTASONE) tablet 60 mg (60 mg Oral Given 12/21/21 1057)    ED Course/ Medical Decision Making/ A&P                           Medical Decision Making Problems Addressed: Acute left-sided low back pain with sciatica, sciatica laterality unspecified: acute illness or injury with systemic symptoms  Amount and/or Complexity of Data Reviewed External Data Reviewed: notes.  Risk Prescription drug management.   Pt requests pain med.  Percocet 1 po, prednisone po.  Reviewed nursing notes and prior charts for additional history.   Vitals normal. Pt comfortable. Pt currently appears stable for d/c.   Rec pcp f/u. Pt also indicates has f/u already arranged w spine/Dr Rolena Infante.  Return precautions provided.           Final Clinical Impression(s) / ED Diagnoses Final diagnoses:  None    Rx / DC Orders ED Discharge Orders     None         Lajean Saver, MD 12/21/21 1126

## 2021-12-21 NOTE — Discharge Instructions (Signed)
It was our pleasure to provide your ER care today - we hope that you feel better.  Take prednisone as prescribed. Take motrin or aleve as need for pain. You may also take percocet as need for pain. No driving for the next 6 hours or when taking percocet. Also, do not take tylenol or acetaminophen containing medication when taking percocet.   Avoid bending at waist, or heavy lifting > 20 lbs for the next week.   Return to ER if worse, new symptoms, intractable pain, numbness/weakness, fevers, or other concern.

## 2021-12-21 NOTE — ED Triage Notes (Signed)
Patient c/o mid lower back pain and has intermittent radiation of pain in her legs.

## 2021-12-28 ENCOUNTER — Emergency Department (HOSPITAL_COMMUNITY)
Admission: EM | Admit: 2021-12-28 | Discharge: 2021-12-28 | Discharge status: Against Medical Advice | Payer: Medicare Other | Attending: Emergency Medicine | Admitting: Emergency Medicine

## 2021-12-28 DIAGNOSIS — Z8543 Personal history of malignant neoplasm of ovary: Secondary | ICD-10-CM | POA: Diagnosis not present

## 2021-12-28 DIAGNOSIS — Z5329 Procedure and treatment not carried out because of patient's decision for other reasons: Secondary | ICD-10-CM | POA: Diagnosis not present

## 2021-12-28 DIAGNOSIS — M5442 Lumbago with sciatica, left side: Secondary | ICD-10-CM | POA: Diagnosis present

## 2021-12-28 MED ORDER — OXYCODONE-ACETAMINOPHEN 5-325 MG PO TABS: 1.0000 | ORAL_TABLET | Freq: Four times a day (QID) | ORAL | 0 refills | Status: AC | PRN

## 2021-12-28 MED ORDER — OXYCODONE-ACETAMINOPHEN 5-325 MG PO TABS
1.0000 | ORAL_TABLET | Freq: Once | ORAL | Status: AC
  Administered 2021-12-28: 1 via ORAL
  Filled 2021-12-28: qty 1

## 2021-12-28 NOTE — ED Triage Notes (Signed)
Pt arrived via POV, c/o back pain. States pain chronic, does not have any pain medicine until she gets to the pain clinic appointment.

## 2021-12-28 NOTE — Discharge Instructions (Signed)
You have been seen today for your complaint of low back pain. Your discharge medications include Percocet.  This is a narcotic.  You should only take it as prescribed and as needed.  He should not drink, drive or operate heavy machinery while taking this medication.  It may also cause constipation.  You should take a laxative while taking this medication. Home care instructions are as follows:  You should continue to heat, ice and perform gentle range of motion exercises as you have prescribed in the past Follow up with: Orthopedics as scheduled Please seek immediate medical care if you develop any of the following symptoms: You cannot control when you pee (urinate) or poop (have a bowel movement). You have weakness in any of these areas and it gets worse: Lower back. The area between your hip bones. Butt. Legs. You have redness or swelling of your back. You have a burning feeling when you pee. At this time there does not appear to be the presence of an emergent medical condition, however there is always the potential for conditions to change. Please read and follow the below instructions.  Do not take your medicine if  develop an itchy rash, swelling in your mouth or lips, or difficulty breathing; call 911 and seek immediate emergency medical attention if this occurs.  You may review your lab tests and imaging results in their entirety on your MyChart account.  Please discuss all results of fully with your primary care provider and other specialist at your follow-up visit.  Note: Portions of this text may have been transcribed using voice recognition software. Every effort was made to ensure accuracy; however, inadvertent computerized transcription errors may still be present.

## 2021-12-28 NOTE — ED Provider Notes (Signed)
[facility] DEPT Provider Note   CSN: [id number] Arrival date & time: 12/28/21  1512     History  Chief Complaint  Patient presents with   Back Pain    Raven [name] is a 61 y.o. female.  With a history of arthritis, anxiety, drug abuse, depression, ovarian cancer who presents ED for evaluation of left-sided low back pain with sciatica.  She has been seen twice recently for this, but states she ran out of her pain medication.  She does have a pain management appointment on 01/11/2022 but does not feel like she can make it to the appointment without pain medication.  She is using heat, ice and performing gentle range of motion exercises.  Denies fevers, chills, saddle paresthesias, urinary or fecal incontinence.   Back Pain      Home Medications Prior to Admission medications   Medication Sig Start Date End Date Taking? Authorizing Provider  oxyCODONE-acetaminophen (PERCOCET/ROXICET) 5-325 MG tablet Take 1 tablet by mouth every 6 (six) hours as needed for up to 2 days for severe pain. 12/28/21 12/30/21 Yes Angelos Wasco, [place] Folk, PA-C  amLODipine (NORVASC) 5 MG tablet TAKE 1 TABLET (5 MG TOTAL) BY MOUTH DAILY. 12/07/21   Janith Lima, MD  aspirin 81 MG EC tablet Take 1 tablet (81 mg total) by mouth daily. Swallow whole. 05/31/21   Janith Lima, MD  atorvastatin (LIPITOR) 20 MG tablet TAKE 1 TABLET BY MOUTH EVERY DAY 12/07/21   Janith Lima, MD  bimatoprost (LUMIGAN) 0.01 % SOLN Place 1 drop into both eyes at bedtime. Patient not taking: Reported on 08/30/2021 10/14/17   Raven Alpha, MD  cyclobenzaprine (FLEXERIL) 5 MG tablet TAKE 1 TABLET BY MOUTH THREE TIMES A DAY AS NEEDED FOR MUSCLE SPASMS 12/07/21   Janith Lima, MD  levothyroxine (SYNTHROID) 175 MCG tablet Take 1 tablet (175 mcg total) by mouth daily. 10/07/21   Janith Lima, MD  predniSONE (DELTASONE) 20 MG tablet 3 po once a day for 2 days, then 2 po once a day for 3 days, then 1 po once  a day for 3 days 12/22/21   Lajean Saver, MD  thiamine 100 MG tablet Take 1 tablet (100 mg total) by mouth daily. Patient not taking: Reported on 08/30/2021 05/30/21   Janith Lima, MD  traZODone (DESYREL) 50 MG tablet TAKE 1 TABLET(50 MG) BY MOUTH AT BEDTIME 11/06/21   Janith Lima, MD      Allergies    Patient has no known allergies.    Review of Systems   Review of Systems  Musculoskeletal:  Positive for back pain.  All other systems reviewed and are negative.   Physical Exam Updated Vital Signs BP 134/79   Pulse (!) 102   Temp 98.3 F (36.8 C) (Oral)   Resp 18   LMP  (LMP Unknown)   SpO2 96%  Physical Exam Vitals and nursing note reviewed.  Constitutional:      General: She is not in acute distress.    Appearance: Normal appearance. She is normal weight. She is not ill-appearing.  HENT:     Head: Normocephalic and atraumatic.  Pulmonary:     Effort: Pulmonary effort is normal. No respiratory distress.  Abdominal:     General: Abdomen is flat.  Musculoskeletal:     Cervical back: Neck supple.     Thoracic back: Normal.     Lumbar back: Spasms present. Decreased range of motion.  Comments: No tenderness to palpation.  Patient was observed ambulating in the ED without difficulty.  Skin:    General: Skin is warm and dry.     Capillary Refill: Capillary refill takes less than 2 seconds.  Neurological:     General: No focal deficit present.     Mental Status: She is alert and oriented to person, place, and time.     Motor: No weakness.     Comments: Sensation and strength intact in bilateral lower extremities  Psychiatric:        Mood and Affect: Mood normal.        Behavior: Behavior normal.     ED Results / Procedures / Treatments   Labs (all labs ordered are listed, but only abnormal results are displayed) Labs Reviewed - No data to display  EKG None  Radiology No results found.  Procedures Procedures    Medications Ordered in  ED Medications  oxyCODONE-acetaminophen (PERCOCET/ROXICET) 5-325 MG per tablet 1 tablet (1 tablet Oral Given 12/28/21 1611)    ED Course/ Medical Decision Making/ A&P                           Medical Decision Making Risk Prescription drug management.  This patient presents to the ED for concern of left-sided low back pain, this involves an extensive number of treatment options, and is a complaint that carries with it a high risk of complications and morbidity.  The differential diagnosis includes Emergent considerations in the differential diagnosis of back pain include:occult fracture, congenital anomalies, tumors, vascular catastrophes, osteomyelitis of vertebrae, infections of disc, meninges or cord, space occupying lesions within canal leading to cord or root compression including epidural abscess.    Additional history obtained from: Nursing notes from this visit. Previous records within EMR system multiple recent ED visits for similar  Afebrile, hemodynamically stable.  Patient is a 25-year-old female presents ED for evaluation of chronic left-sided low back pain.  No change in her symptoms.  She states that she ran out of her pain medication at home and would like a refill.  She does have a pain management appointment in approximately 2 weeks and just needs to have pain medication to last her until she has that appointment.  We will provide 2 days worth of her Percocet and informed her to alternate with Tylenol and ibuprofen at home for extended pain relief.  Informed her to continue her icing, heating and gentle range of motion exercises.  Patient has no saddle paresthesias, fecal or urinary incontinence, or fevers and I have low suspicion for cord compression syndrome.  Strongly encourage patient to follow-up with pain management provider in 2 weeks.   At this time there does not appear to be any evidence of an acute emergency medical condition and the patient appears stable for  discharge with appropriate outpatient follow up. Diagnosis was discussed with patient who verbalizes understanding of care plan and is agreeable to discharge. I have discussed return precautions with patient who verbalizes understanding. Patient encouraged to follow-up with their PCP within 1 week. All questions answered.  Note: Portions of this report may have been transcribed using voice recognition software. Every effort was made to ensure accuracy; however, inadvertent computerized transcription errors may still be present.          Final Clinical Impression(s) / ED Diagnoses Final diagnoses:  Acute left-sided low back pain with left-sided sciatica    Rx / DC Orders  ED Discharge Orders          Ordered    oxyCODONE-acetaminophen (PERCOCET/ROXICET) 5-325 MG tablet  Every 6 hours PRN        12/28/21 1605              Roylene Reason, Hershal Coria 12/28/21 1639    Sherwood Gambler, MD 12/28/21 1718

## 2022-01-11 ENCOUNTER — Other Ambulatory Visit: Payer: Self-pay | Admitting: Internal Medicine

## 2022-01-11 DIAGNOSIS — E785 Hyperlipidemia, unspecified: Secondary | ICD-10-CM

## 2022-01-11 DIAGNOSIS — E039 Hypothyroidism, unspecified: Secondary | ICD-10-CM

## 2022-01-15 ENCOUNTER — Encounter (HOSPITAL_BASED_OUTPATIENT_CLINIC_OR_DEPARTMENT_OTHER): Payer: Medicare Other | Admitting: Medical

## 2022-01-15 ENCOUNTER — Ambulatory Visit (HOSPITAL_BASED_OUTPATIENT_CLINIC_OR_DEPARTMENT_OTHER): Payer: Medicare Other | Admitting: Medical

## 2022-01-21 ENCOUNTER — Encounter: Payer: Self-pay | Admitting: Internal Medicine

## 2022-01-21 ENCOUNTER — Encounter (HOSPITAL_COMMUNITY): Payer: Self-pay

## 2022-01-21 ENCOUNTER — Emergency Department (HOSPITAL_COMMUNITY)
Admission: EM | Admit: 2022-01-21 | Discharge: 2022-01-21 | Discharge status: Against Medical Advice | Payer: Medicare Other | Attending: Emergency Medicine | Admitting: Emergency Medicine

## 2022-01-21 DIAGNOSIS — Z5321 Procedure and treatment not carried out due to patient leaving prior to being seen by health care provider: Secondary | ICD-10-CM | POA: Diagnosis not present

## 2022-01-21 DIAGNOSIS — M545 Low back pain, unspecified: Secondary | ICD-10-CM | POA: Insufficient documentation

## 2022-01-21 NOTE — ED Triage Notes (Signed)
Patient c/o left lower back pain and states her pain medication is not working. Patient states she is going to physical therapy and states she is scheduled for pain management on 02/22/22

## 2022-01-31 ENCOUNTER — Encounter: Payer: Self-pay | Admitting: Internal Medicine

## 2022-01-31 ENCOUNTER — Other Ambulatory Visit: Payer: Self-pay | Admitting: Internal Medicine

## 2022-01-31 DIAGNOSIS — E785 Hyperlipidemia, unspecified: Secondary | ICD-10-CM

## 2022-01-31 DIAGNOSIS — E039 Hypothyroidism, unspecified: Secondary | ICD-10-CM

## 2022-02-19 ENCOUNTER — Ambulatory Visit (AMBULATORY_SURGERY_CENTER): Payer: Medicare Other

## 2022-02-19 VITALS — Ht 67.0 in | Wt 189.0 lb

## 2022-02-19 DIAGNOSIS — Z800 Family history of malignant neoplasm of digestive organs: Secondary | ICD-10-CM

## 2022-02-19 DIAGNOSIS — Z86010 Personal history of colonic polyps: Secondary | ICD-10-CM

## 2022-02-19 MED ORDER — NA SULFATE-K SULFATE-MG SULF 17.5-3.13-1.6 GM/177ML PO SOLN: 1.0000 | Freq: Once | ORAL | 0 refills | Status: AC

## 2022-02-19 NOTE — Progress Notes (Signed)
No egg or soy allergy known to patient  No issues known to pt with past sedation with any surgeries or procedures Patient denies ever being told they had issues or difficulty with intubation  No FH of Malignant Hyperthermia Pt is not on diet pills Pt is not on  home 02  Pt is not on blood thinners  Pt denies issues with constipation  No A fib or A flutter Have any cardiac testing pending--no Pt instructed to use [email or web address] or GoodRx for a price reduction on prep   

## 2022-03-19 ENCOUNTER — Encounter: Payer: 59 | Admitting: Internal Medicine

## 2022-04-16 ENCOUNTER — Other Ambulatory Visit: Payer: Self-pay | Admitting: Internal Medicine

## 2022-04-16 DIAGNOSIS — E785 Hyperlipidemia, unspecified: Secondary | ICD-10-CM

## 2022-05-07 ENCOUNTER — Other Ambulatory Visit: Payer: Self-pay | Admitting: Internal Medicine

## 2022-05-07 DIAGNOSIS — E039 Hypothyroidism, unspecified: Secondary | ICD-10-CM

## 2022-08-22 ENCOUNTER — Other Ambulatory Visit: Payer: Self-pay | Admitting: Nurse Practitioner

## 2022-08-22 DIAGNOSIS — Z1231 Encounter for screening mammogram for malignant neoplasm of breast: Secondary | ICD-10-CM

## 2022-08-23 ENCOUNTER — Ambulatory Visit
Admission: RE | Admit: 2022-08-23 | Discharge: 2022-08-23 | Disposition: A | Discharge status: Home / Self Care | Payer: 59 | Source: Ambulatory Visit | Attending: Nurse Practitioner | Admitting: Nurse Practitioner

## 2022-08-23 DIAGNOSIS — Z1231 Encounter for screening mammogram for malignant neoplasm of breast: Secondary | ICD-10-CM

## 2022-08-24 ENCOUNTER — Other Ambulatory Visit: Payer: Self-pay | Admitting: Internal Medicine

## 2022-08-24 DIAGNOSIS — E039 Hypothyroidism, unspecified: Secondary | ICD-10-CM

## 2022-09-01 ENCOUNTER — Emergency Department (HOSPITAL_COMMUNITY): Payer: 59

## 2022-09-01 ENCOUNTER — Emergency Department (HOSPITAL_COMMUNITY)
Admission: EM | Admit: 2022-09-01 | Discharge: 2022-09-01 | Disposition: A | Discharge status: Home / Self Care | Payer: 59 | Attending: Emergency Medicine | Admitting: Emergency Medicine

## 2022-09-01 ENCOUNTER — Encounter (HOSPITAL_COMMUNITY): Payer: Self-pay

## 2022-09-01 DIAGNOSIS — M5136 Other intervertebral disc degeneration, lumbar region: Secondary | ICD-10-CM | POA: Diagnosis not present

## 2022-09-01 DIAGNOSIS — E119 Type 2 diabetes mellitus without complications: Secondary | ICD-10-CM | POA: Diagnosis not present

## 2022-09-01 DIAGNOSIS — G039 Meningitis, unspecified: Secondary | ICD-10-CM | POA: Insufficient documentation

## 2022-09-01 DIAGNOSIS — M4800 Spinal stenosis, site unspecified: Secondary | ICD-10-CM | POA: Diagnosis not present

## 2022-09-01 DIAGNOSIS — Z8543 Personal history of malignant neoplasm of ovary: Secondary | ICD-10-CM | POA: Insufficient documentation

## 2022-09-01 DIAGNOSIS — Z7982 Long term (current) use of aspirin: Secondary | ICD-10-CM | POA: Diagnosis not present

## 2022-09-01 DIAGNOSIS — R32 Unspecified urinary incontinence: Secondary | ICD-10-CM | POA: Insufficient documentation

## 2022-09-01 LAB — CBC WITH DIFFERENTIAL/PLATELET
Abs Immature Granulocytes: 0.01 10*3/uL (ref 0.00–0.07)
Basophils Absolute: 0 10*3/uL (ref 0.0–0.1)
Basophils Relative: 1 %
Eosinophils Absolute: 0.3 10*3/uL (ref 0.0–0.5)
Eosinophils Relative: 4 %
HCT: 36.6 % (ref 36.0–46.0)
Hemoglobin: 12.1 g/dL (ref 12.0–15.0)
Immature Granulocytes: 0 %
Lymphocytes Relative: 30 %
Lymphs Abs: 1.9 10*3/uL (ref 0.7–4.0)
MCH: 30.5 pg (ref 26.0–34.0)
MCHC: 33.1 g/dL (ref 30.0–36.0)
MCV: 92.2 fL (ref 80.0–100.0)
Monocytes Absolute: 0.4 10*3/uL (ref 0.1–1.0)
Monocytes Relative: 7 %
Neutro Abs: 3.6 10*3/uL (ref 1.7–7.7)
Neutrophils Relative %: 58 %
Platelets: 226 10*3/uL (ref 150–400)
RBC: 3.97 MIL/uL (ref 3.87–5.11)
RDW: 13.4 % (ref 11.5–15.5)
WBC: 6.2 10*3/uL (ref 4.0–10.5)
nRBC: 0 % (ref 0.0–0.2)

## 2022-09-01 LAB — URINALYSIS, ROUTINE W REFLEX MICROSCOPIC
Bilirubin Urine: NEGATIVE
Glucose, UA: NEGATIVE mg/dL
Hgb urine dipstick: NEGATIVE
Ketones, ur: NEGATIVE mg/dL
Leukocytes,Ua: NEGATIVE
Nitrite: NEGATIVE
Protein, ur: NEGATIVE mg/dL
Specific Gravity, Urine: 1.021 (ref 1.005–1.030)
pH: 5 (ref 5.0–8.0)

## 2022-09-01 LAB — COMPREHENSIVE METABOLIC PANEL
ALT: 19 U/L (ref 0–44)
AST: 27 U/L (ref 15–41)
Albumin: 4 g/dL (ref 3.5–5.0)
Alkaline Phosphatase: 94 U/L (ref 38–126)
Anion gap: 7 (ref 5–15)
BUN: 29 mg/dL — ABNORMAL HIGH (ref 8–23)
CO2: 25 mmol/L (ref 22–32)
Calcium: 9.1 mg/dL (ref 8.9–10.3)
Chloride: 108 mmol/L (ref 98–111)
Creatinine, Ser: 1.47 mg/dL — ABNORMAL HIGH (ref 0.44–1.00)
GFR, Estimated: 40 mL/min — ABNORMAL LOW (ref >60)
Glucose, Bld: 131 mg/dL — ABNORMAL HIGH (ref 70–99)
Potassium: 3.8 mmol/L (ref 3.5–5.1)
Sodium: 140 mmol/L (ref 135–145)
Total Bilirubin: 0.4 mg/dL (ref 0.3–1.2)
Total Protein: 7.4 g/dL (ref 6.5–8.1)

## 2022-09-01 LAB — RAPID URINE DRUG SCREEN, HOSP PERFORMED
Amphetamines: NOT DETECTED
Barbiturates: NOT DETECTED
Benzodiazepines: NOT DETECTED
Cocaine: NOT DETECTED
Opiates: NOT DETECTED
Tetrahydrocannabinol: NOT DETECTED

## 2022-09-01 LAB — LIPASE, BLOOD: Lipase: 33 U/L (ref 11–51)

## 2022-09-01 MED ORDER — DICYCLOMINE HCL 10 MG PO CAPS
10.0000 mg | ORAL_CAPSULE | Freq: Once | ORAL | Status: AC
  Administered 2022-09-01: 10 mg via ORAL
  Filled 2022-09-01: qty 1

## 2022-09-01 MED ORDER — ACETAMINOPHEN 500 MG PO TABS
1000.0000 mg | ORAL_TABLET | Freq: Once | ORAL | Status: AC
  Administered 2022-09-01: 1000 mg via ORAL
  Filled 2022-09-01: qty 2

## 2022-09-01 MED ORDER — OXYCODONE-ACETAMINOPHEN 5-325 MG PO TABS: 1.0000 | ORAL_TABLET | Freq: Three times a day (TID) | ORAL | 0 refills | Status: AC | PRN

## 2022-09-01 MED ORDER — SODIUM CHLORIDE (PF) 0.9 % IJ SOLN
INTRAMUSCULAR | Status: AC
  Filled 2022-09-01: qty 50

## 2022-09-01 MED ORDER — CYCLOBENZAPRINE HCL 10 MG PO TABS
10.0000 mg | ORAL_TABLET | Freq: Once | ORAL | Status: AC
  Administered 2022-09-01: 10 mg via ORAL
  Filled 2022-09-01: qty 1

## 2022-09-01 MED ORDER — LIDOCAINE 5 % EX PTCH: 1.0000 | MEDICATED_PATCH | CUTANEOUS | 0 refills | Status: AC

## 2022-09-01 MED ORDER — GADOBUTROL 1 MMOL/ML IV SOLN
8.5000 mL | Freq: Once | INTRAVENOUS | Status: AC | PRN
  Administered 2022-09-01: 8.5 mL via INTRAVENOUS

## 2022-09-01 MED ORDER — IOHEXOL 300 MG/ML  SOLN
80.0000 mL | Freq: Once | INTRAMUSCULAR | Status: AC | PRN
  Administered 2022-09-01: 80 mL via INTRAVENOUS

## 2022-09-01 NOTE — Discharge Instructions (Addendum)
Thank you for letting us take care of you today.  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  This is likely what is causing the pain in your back.  We recommend following up with neurology for further assessment of this condition as well as your PCP to discuss any further recommendations.  I prescribed a small amount of pain medication to help you over the next several days while you are awaiting these follow-up appointments.  Your urine did not appear infected.  We did not see any new changes on the CT scan of your abdomen today.  The rest of your labs are similar to your previous labs.  We do not see an emergent reason for the episode of incontinence you had today but if you continue to have this issue discuss it with your PCP at your next follow-up appointment.  For new or worsening condition, return to the nearest ED for reevaluation.

## 2022-09-01 NOTE — ED Notes (Signed)
Called main lab for urine drug screen that was ordered.
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
## 2022-09-01 NOTE — ED Triage Notes (Signed)
Pt arrived via POV, c/o left sided lower back pain, and episodes of urinary incontinence. Denies any pain or blood in urine.

## 2022-09-19 ENCOUNTER — Other Ambulatory Visit: Payer: Self-pay | Admitting: Internal Medicine

## 2022-09-19 ENCOUNTER — Other Ambulatory Visit: Payer: Self-pay

## 2022-09-19 ENCOUNTER — Encounter (HOSPITAL_COMMUNITY): Payer: Self-pay | Admitting: Emergency Medicine

## 2022-09-19 ENCOUNTER — Emergency Department (HOSPITAL_COMMUNITY)
Admission: EM | Admit: 2022-09-19 | Discharge: 2022-09-19 | Disposition: A | Discharge status: Home / Self Care | Payer: 59 | Attending: Emergency Medicine | Admitting: Emergency Medicine

## 2022-09-19 DIAGNOSIS — Z7982 Long term (current) use of aspirin: Secondary | ICD-10-CM | POA: Diagnosis not present

## 2022-09-19 DIAGNOSIS — M545 Low back pain, unspecified: Secondary | ICD-10-CM | POA: Diagnosis not present

## 2022-09-19 DIAGNOSIS — E039 Hypothyroidism, unspecified: Secondary | ICD-10-CM

## 2022-09-19 DIAGNOSIS — H9202 Otalgia, left ear: Secondary | ICD-10-CM | POA: Diagnosis not present

## 2022-09-19 DIAGNOSIS — Z760 Encounter for issue of repeat prescription: Secondary | ICD-10-CM | POA: Diagnosis not present

## 2022-09-19 MED ORDER — LIDOCAINE 5 % EX PTCH: 1.0000 | MEDICATED_PATCH | CUTANEOUS | 0 refills | Status: AC

## 2022-09-19 MED ORDER — LIDOCAINE 5 % EX PTCH
1.0000 | MEDICATED_PATCH | CUTANEOUS | Status: DC
  Administered 2022-09-19: 1 via TRANSDERMAL
  Filled 2022-09-19: qty 1

## 2022-09-19 MED ORDER — OXYCODONE-ACETAMINOPHEN 5-325 MG PO TABS
1.0000 | ORAL_TABLET | Freq: Once | ORAL | Status: AC
  Administered 2022-09-19: 1 via ORAL
  Filled 2022-09-19: qty 1

## 2022-09-19 NOTE — Discharge Instructions (Addendum)
If you develop worsening, recurrent, or continued back pain, numbness or weakness in the legs, incontinence of your bowels or bladders, numbness of your buttocks, fever, abdominal pain, or any other new/concerning symptoms then return to the ER for evaluation.  

## 2022-09-19 NOTE — ED Provider Notes (Signed)
[place] EMERGENCY DEPARTMENT AT [facility] Provider Note   CSN: [id number] Arrival date & time: 09/19/22  7829     History  Chief Complaint  Patient presents with   Back Pain   Otalgia    Raven [name] is a 62 y.o. female.  HPI 62 year old female with a history of chronic low back pain presents with 2 complaints.  Her first complaint is her left ear.  She is concerned there is a bug in it.  She states she was at a friend's house sleeping over last night and when she woke up there was a roach next to her ear and she is pretty sure something was in it.  She was scratching her ear to the point that there was some bleeding.  Second complaint is that she is having continued severe low back pain.  She was here on 6/30 and got some Percocet and Lidoderm.  She is asking for a refill of these medications.  She chronically has low back pain and is trying to get into pain management but was told she cannot until she gets referral from her PCP and her PCP cannot see her until August.  No fevers, incontinence, weakness or numbness in the lower extremities.  No abdominal pain.  No urinary symptoms.  Home Medications Prior to Admission medications   Medication Sig Start Date End Date Taking? Authorizing Provider  ALEVE 220 MG tablet Take 220-440 mg by mouth 2 (two) times daily as needed (for pain).    [provider]  amLODipine (NORVASC) 5 MG tablet TAKE 1 TABLET (5 MG TOTAL) BY MOUTH DAILY. 12/07/21   Etta Grandchild, MD  aspirin 81 MG EC tablet Take 1 tablet (81 mg total) by mouth daily. Swallow whole. Patient not taking: Reported on 09/01/2022 05/31/21   Etta Grandchild, MD  atorvastatin (LIPITOR) 20 MG tablet TAKE 1 TABLET BY MOUTH EVERY DAY 12/07/21   Etta Grandchild, MD  bimatoprost (LUMIGAN) 0.01 % SOLN Place 1 drop into both eyes at bedtime. Patient not taking: Reported on 09/01/2022 10/14/17   Arlyce Harman, MD  cyclobenzaprine (FLEXERIL) 5 MG tablet TAKE 1 TABLET  BY MOUTH THREE TIMES A DAY AS NEEDED FOR MUSCLE SPASMS Patient taking differently: Take 5 mg by mouth in the morning and at bedtime. 12/07/21   Etta Grandchild, MD  diclofenac Sodium (VOLTAREN ARTHRITIS PAIN) 1 % GEL Apply 2-4 g topically 4 (four) times daily as needed (for pain- left side).    [provider]  latanoprost (XALATAN) 0.005 % ophthalmic solution Place 1 drop into both eyes at bedtime.    [provider]  levothyroxine (SYNTHROID) 150 MCG tablet Take 150 mcg by mouth daily before breakfast.    [provider]  levothyroxine (SYNTHROID) 175 MCG tablet Take 1 tablet (175 mcg total) by mouth daily. Patient not taking: Reported on 09/01/2022 10/07/21   Etta Grandchild, MD  predniSONE (DELTASONE) 20 MG tablet 3 po once a day for 2 days, then 2 po once a day for 3 days, then 1 po once a day for 3 days Patient not taking: Reported on 09/01/2022 12/22/21   Cathren Laine, MD  sertraline (ZOLOFT) 25 MG tablet Take 25 mg by mouth in the morning.    [provider]  thiamine 100 MG tablet Take 1 tablet (100 mg total) by mouth daily. Patient not taking: Reported on 09/01/2022 05/30/21   Etta Grandchild, MD  traZODone (DESYREL) 100 MG tablet  Take 100 mg by mouth at bedtime.    [provider]  traZODone (DESYREL) 50 MG tablet TAKE 1 TABLET(50 MG) BY MOUTH AT BEDTIME Patient not taking: Reported on 09/01/2022 11/06/21   Etta Grandchild, MD      Allergies    Patient has no known allergies.    Review of Systems   Review of Systems  Gastrointestinal:  Negative for abdominal pain.  Genitourinary:  Negative for dysuria.  Musculoskeletal:  Positive for back pain.  Neurological:  Negative for weakness and numbness.    Physical Exam Updated Vital Signs BP 131/82 (BP Location: Left Arm)   Pulse 94   Temp 98.9 F (37.2 C) (Oral)   Resp 18   Ht 5\' 7"  (1.702 m)   Wt 85.7 kg   LMP  (LMP Unknown)   SpO2 100%   BMI 29.59 kg/m  Physical Exam Vitals and  nursing note reviewed.  Constitutional:      Appearance: She is well-developed.  HENT:     Head: Normocephalic and atraumatic.     Right Ear: Tympanic membrane normal.     Left Ear: Tympanic membrane normal.     Ears:     Comments: There is a small scab in the left ear canal. No foreign body or bug Cardiovascular:     Rate and Rhythm: Normal rate and regular rhythm.     Pulses:          Dorsalis pedis pulses are 2+ on the right side and 2+ on the left side.  Pulmonary:     Effort: Pulmonary effort is normal.  Abdominal:     Palpations: Abdomen is soft.     Tenderness: There is no abdominal tenderness.  Musculoskeletal:     Lumbar back: Tenderness present. No bony tenderness.       Back:     Left hip: No tenderness. Normal range of motion.  Skin:    General: Skin is warm and dry.  Neurological:     Mental Status: She is alert.     Comments: 5/5 strength in BLE. Grossly normal sensation     ED Results / Procedures / Treatments   Labs (all labs ordered are listed, but only abnormal results are displayed) Labs Reviewed - No data to display  EKG None  Radiology No results found.  Procedures Procedures    Medications Ordered in ED Medications  oxyCODONE-acetaminophen (PERCOCET/ROXICET) 5-325 MG per tablet 1 tablet (has no administration in time range)  lidocaine (LIDODERM) 5 % 1 patch (has no administration in time range)    ED Course/ Medical Decision Making/ A&P                             Medical Decision Making Amount and/or Complexity of Data Reviewed External Data Reviewed: notes.  Risk Prescription drug management.   Patient's ear exam shows what appears to be a scab from where she scratched her ear but I do not see a foreign body or bug.  There is no evidence of otitis media.  From her back, she states that she follows with Dr. Shon Baton but he is recommending surgery and she does not want to do that.  She wants to go to pain management.  I discussed  that we do not typically refill narcotics in the emergency department.  I will give her a dose here at her request but otherwise discussed over-the-counter medications.  She declines wanting a  muscle relaxer.  She states the Lidoderm did help and so I will refill this.  Otherwise encouraged close follow-up with PCP.  Given return precautions.  Very low suspicion that she is having an acute spinal cord emergency.  Her MRI from a few weeks ago showed disease but no acute spinal cord emergency and I doubt there has been a significant change based on presentation that would require repeat image.        Final Clinical Impression(s) / ED Diagnoses Final diagnoses:  None    Rx / DC Orders ED Discharge Orders     None         Pricilla Loveless, MD 09/19/22 [phone]

## 2022-09-19 NOTE — ED Triage Notes (Signed)
Pt endorses bilateral ear pain and feels like something is in there. Also endorses lower back pain that is same as pain when she was here on 6/30 and had imaging performed. Unable to see doctor until August.

## 2022-10-14 ENCOUNTER — Encounter (HOSPITAL_COMMUNITY): Payer: Self-pay | Admitting: *Deleted

## 2022-10-14 ENCOUNTER — Emergency Department (HOSPITAL_COMMUNITY)
Admission: EM | Admit: 2022-10-14 | Discharge: 2022-10-14 | Discharge status: Against Medical Advice | Payer: Medicare HMO | Attending: Emergency Medicine | Admitting: Emergency Medicine

## 2022-10-14 ENCOUNTER — Other Ambulatory Visit: Payer: Self-pay

## 2022-10-14 DIAGNOSIS — Z5321 Procedure and treatment not carried out due to patient leaving prior to being seen by health care provider: Secondary | ICD-10-CM | POA: Diagnosis not present

## 2022-10-14 DIAGNOSIS — M791 Myalgia, unspecified site: Secondary | ICD-10-CM | POA: Insufficient documentation

## 2022-10-14 NOTE — ED Triage Notes (Signed)
Pt presents with pain in different areas of body, when trying to get specifics pt becomes anxious and hyperventilating. She has been seen for same several times.

## 2022-10-20 ENCOUNTER — Emergency Department (HOSPITAL_COMMUNITY)
Admission: EM | Admit: 2022-10-20 | Discharge: 2022-10-20 | Disposition: A | Discharge status: Home / Self Care | Payer: Medicare HMO | Attending: Emergency Medicine | Admitting: Emergency Medicine

## 2022-10-20 ENCOUNTER — Emergency Department (HOSPITAL_COMMUNITY): Payer: Medicare HMO

## 2022-10-20 ENCOUNTER — Encounter (HOSPITAL_COMMUNITY): Payer: Self-pay

## 2022-10-20 DIAGNOSIS — E119 Type 2 diabetes mellitus without complications: Secondary | ICD-10-CM | POA: Diagnosis not present

## 2022-10-20 DIAGNOSIS — Y9222 Religious institution as the place of occurrence of the external cause: Secondary | ICD-10-CM | POA: Insufficient documentation

## 2022-10-20 DIAGNOSIS — W108XXA Fall (on) (from) other stairs and steps, initial encounter: Secondary | ICD-10-CM | POA: Diagnosis not present

## 2022-10-20 DIAGNOSIS — M25511 Pain in right shoulder: Secondary | ICD-10-CM | POA: Diagnosis present

## 2022-10-20 DIAGNOSIS — Z79899 Other long term (current) drug therapy: Secondary | ICD-10-CM | POA: Diagnosis not present

## 2022-10-20 DIAGNOSIS — W19XXXA Unspecified fall, initial encounter: Secondary | ICD-10-CM

## 2022-10-20 DIAGNOSIS — M542 Cervicalgia: Secondary | ICD-10-CM | POA: Diagnosis not present

## 2022-10-20 MED ORDER — MORPHINE SULFATE (PF) 4 MG/ML IV SOLN: 4.0000 mg | Freq: Once | INTRAVENOUS | Status: DC

## 2022-10-20 MED ORDER — OXYCODONE-ACETAMINOPHEN 5-325 MG PO TABS
1.0000 | ORAL_TABLET | Freq: Once | ORAL | Status: AC
  Administered 2022-10-20: 1 via ORAL
  Filled 2022-10-20: qty 1

## 2022-10-20 NOTE — Discharge Instructions (Addendum)
Was a pleasure caring for you today.  Imaging was negative for any fractures.  Recommend following up with your primary care provider in the next couple days for reevaluation.  Seek emergency care if experiencing any new or worsening symptoms.  Alternating between 650 mg Tylenol and 400 mg Advil: The best way to alternate taking Acetaminophen (example Tylenol) and Ibuprofen (example Advil/Motrin) is to take them 3 hours apart. For example, if you take ibuprofen at 6 am you can then take Tylenol at 9 am. You can continue this regimen throughout the day, making sure you do not exceed the recommended maximum dose for each drug.

## 2022-10-20 NOTE — ED Triage Notes (Signed)
Pt arrived via POV, c/o right arm and neck pain after falling off a step at church.

## 2022-10-20 NOTE — ED Provider Notes (Signed)
**Raven Raven** Raven Raven   CSN: [id number] Arrival date & time: 10/20/22  1302     History  Chief Complaint  Patient presents with   Tennis Must is a 62 y.o. female with PMHx anxiety, arthritis, HLD, DM who presents to ED concerned for neck pain and right shoulder pain after a mechanical fall earlier today while at church. States that she tripped and fell over the steps and landed on right shoulder.  Denies blood thinners, LOC, seizures, headache, vision changes, chest pain, dyspnea, abdominal pain, nausea, vomiting, confusion.   Fall       Home Medications Prior to Admission medications   Medication Sig Start Date End Date Taking? Authorizing Provider  buPROPion (WELLBUTRIN XL) 150 MG 24 hr tablet Take 150 mg by mouth daily. 10/18/22  Yes [provider]  ALEVE 220 MG tablet Take 220-440 mg by mouth 2 (two) times daily as needed (for pain).    [provider]  amLODipine (NORVASC) 5 MG tablet TAKE 1 TABLET (5 MG TOTAL) BY MOUTH DAILY. 12/07/21   Etta Grandchild, MD  aspirin 81 MG EC tablet Take 1 tablet (81 mg total) by mouth daily. Swallow whole. Patient not taking: Reported on 09/01/2022 05/31/21   Etta Grandchild, MD  atorvastatin (LIPITOR) 20 MG tablet TAKE 1 TABLET BY MOUTH EVERY DAY 12/07/21   Etta Grandchild, MD  bimatoprost (LUMIGAN) 0.01 % SOLN Place 1 drop into both eyes at bedtime. Patient not taking: Reported on 09/01/2022 10/14/17   Arlyce Harman, MD  cyclobenzaprine (FLEXERIL) 5 MG tablet TAKE 1 TABLET BY MOUTH THREE TIMES A DAY AS NEEDED FOR MUSCLE SPASMS Patient taking differently: Take 5 mg by mouth in the morning and at bedtime. 12/07/21   Etta Grandchild, MD  diclofenac Sodium (VOLTAREN ARTHRITIS PAIN) 1 % GEL Apply 2-4 g topically 4 (four) times daily as needed (for pain- left side).    [provider]  latanoprost (XALATAN) 0.005 % ophthalmic solution Place 1 drop into both  eyes at bedtime.    [provider]  levothyroxine (SYNTHROID) 150 MCG tablet Take 150 mcg by mouth daily before breakfast.    [provider]  levothyroxine (SYNTHROID) 175 MCG tablet Take 1 tablet (175 mcg total) by mouth daily. Patient not taking: Reported on 09/01/2022 10/07/21   Etta Grandchild, MD  lidocaine (LIDODERM) 5 % Place 1 patch onto the skin daily. Remove & Discard patch within 12 hours or as directed by MD 09/19/22   Pricilla Loveless, MD  predniSONE (DELTASONE) 20 MG tablet 3 po once a day for 2 days, then 2 po once a day for 3 days, then 1 po once a day for 3 days Patient not taking: Reported on 09/01/2022 12/22/21   Cathren Laine, MD  sertraline (ZOLOFT) 25 MG tablet Take 25 mg by mouth in the morning.    [provider]  thiamine 100 MG tablet Take 1 tablet (100 mg total) by mouth daily. Patient not taking: Reported on 09/01/2022 05/30/21   Etta Grandchild, MD  traZODone (DESYREL) 100 MG tablet Take 100 mg by mouth at bedtime.    [provider]  traZODone (DESYREL) 50 MG tablet TAKE 1 TABLET(50 MG) BY MOUTH AT BEDTIME Patient not taking: Reported on 09/01/2022 11/06/21   Etta Grandchild, MD      Allergies    Patient has no known allergies.    Review of  Systems   Review of Systems  Musculoskeletal:        Neck pain, shoulder pain    Physical Exam Updated Vital Signs BP (!) 163/87 (BP Location: Left Arm)   Pulse (!) 107   Temp 97.6 F (36.4 C) (Oral)   Resp 20   LMP  (LMP Unknown)   SpO2 97%  Physical Exam Vitals and nursing Raven reviewed.  Constitutional:      General: She is not in acute distress. HENT:     Head: Normocephalic and atraumatic.     Mouth/Throat:     Mouth: Mucous membranes are moist.  Eyes:     General: No scleral icterus.       Right eye: No discharge.        Left eye: No discharge.     Conjunctiva/sclera: Conjunctivae normal.  Cardiovascular:     Rate and Rhythm: Normal rate and regular rhythm.     Pulses:  Normal pulses.     Heart sounds: Normal heart sounds. No murmur heard. Pulmonary:     Effort: Pulmonary effort is normal. No respiratory distress.     Breath sounds: Normal breath sounds. No wheezing, rhonchi or rales.  Abdominal:     General: Abdomen is flat. Bowel sounds are normal. There is no distension.     Palpations: Abdomen is soft. There is no mass.     Tenderness: There is no abdominal tenderness.  Musculoskeletal:     Cervical back: Normal range of motion.     Right lower leg: No edema.     Left lower leg: No edema.     Comments: Tenderness of right shoulder. Right shoulder ROM intact. Sensation to light touch intact. +2 radial pulses.   GCS 15. Speech is goal oriented. No deficits appreciated to CN III-XII. Patient moves extremities without ataxia.    Skin:    General: Skin is warm and dry.     Findings: No rash.  Neurological:     General: No focal deficit present.     Mental Status: She is alert. Mental status is at baseline.     Sensory: No sensory deficit.     Motor: No weakness.  Psychiatric:        Mood and Affect: Mood normal.     ED Results / Procedures / Treatments   Labs (all labs ordered are listed, but only abnormal results are displayed) Labs Reviewed - No data to display  EKG None  Radiology DG Shoulder Right  Result Date: 10/20/2022 CLINICAL DATA:  Fall with shoulder pain. EXAM: RIGHT SHOULDER - 2+ VIEW COMPARISON:  Chest radiograph dated 08/30/2021. FINDINGS: There is no evidence of fracture or dislocation. Degenerative changes are seen of the glenohumeral joint and acromioclavicular joint. Soft tissues are unremarkable. IMPRESSION: No acute osseous injury. Degenerative changes. Electronically Signed   By: Romona Curls M.D.   On: 10/20/2022 15:12   CT Cervical Spine Wo Contrast  Result Date: 10/20/2022 CLINICAL DATA:  Neck trauma, pain EXAM: CT CERVICAL SPINE WITHOUT CONTRAST TECHNIQUE: Multidetector CT imaging of the cervical spine was  performed without intravenous contrast. Multiplanar CT image reconstructions were also generated. RADIATION DOSE REDUCTION: This exam was performed according to the departmental dose-optimization program which includes automated exposure control, adjustment of the mA and/or kV according to patient size and/or use of iterative reconstruction technique. COMPARISON:  None Available. FINDINGS: Alignment: Anatomic alignment.  No static listhesis. Skull base and vertebrae: No acute fracture. No aggressive lytic or sclerotic osseous lesion.  Soft tissues and spinal canal: Intraspinal soft tissues are not fully imaged on this examination due to poor soft tissue contrast, but there is no gross soft tissue abnormality. No prevertebral fluid or swelling. No visible canal hematoma. Disc levels: Degenerative disease with disc height loss C3-4, C4-5, C5-6 and C6-7. Broad-based disc osteophyte complex at C3-4 with bilateral uncovertebral degenerative changes, mild bilateral facet arthropathy, mild bilateral foraminal stenosis. At C4-5 there is moderate bilateral facet arthropathy. At C5-6 there is a mild broad-based disc bulge without foraminal stenosis. At C6-7 there is a mild broad-based disc bulge. Upper chest: Lung apices are clear. Other: No fluid collection or hematoma. Prior thyroidectomy. Bilateral carotid artery atherosclerosis. IMPRESSION: 1. No acute osseous injury of the cervical spine. 2. Cervical spine spondylosis as described above. Electronically Signed   By: Elige Ko M.D.   On: 10/20/2022 15:08    Procedures Procedures    Medications Ordered in ED Medications  oxyCODONE-acetaminophen (PERCOCET/ROXICET) 5-325 MG per tablet 1 tablet (1 tablet Oral Given 10/20/22 1351)    ED Course/ Medical Decision Making/ A&P                                 Medical Decision Making Amount and/or Complexity of Data Reviewed Radiology: ordered.  Risk Prescription drug management.   This patient presents to the  ED after a mechanical fall, this involves an extensive number of treatment options, and is a complaint that carries with it a high risk of complications and morbidity.  The differential diagnosis includes  intracranial hemorrhage, subdural/epidural hematoma, vertebral fracture, spinal cord injury, muscle strain, skull fracture, fracture.   Co morbidities that complicate the patient evaluation  anxiety, arthritis, HLD, DM    Imaging Studies ordered:  I ordered imaging studies including  -CT cervical spine and right shoulder xray: Assess for fractures given patient's fall and pain. I independently visualized and interpreted imaging I agree with the radiologist interpretation Using the Canadian CT Score, I did not find it necessary to obtain a head CT due patient's age <65yo and due to lack of blood thinners, seizures, signs of skull fractures, retrograde amnesia, and vomiting.    Problem List / ED Course / Critical interventions / Medication management  Patient presented for mechanical Fall. Not on blood thinners. No LOC, seizures, nausea, vomiting, headache, vision changes. Patient with stable vitals and does not appear to be in distress. Patient very talkative with staff while in her hallway bed in ED. Patient had a score of 0 for the Canadian head CT score and so head imaging was not obtained at this time. Patient complaining of neck pain and right shoulder pain so imaging was obtained. Patient requesting Percocet for pain control which I have provided. Imaging without concern for fractures or dislocations. Patient will be encouraged to follow-up with primary care provider to be reevaluated in the next few days. Educated patient on alternating Ibuprofen and Tylenol for pain control. I have reviewed the patients home medicines and have made adjustments as needed Patient was given return precautions. Patient stable for discharge at this time. Patient verbalized understanding of plan.   DDx:  These are considered less likely due to history of present illness and physical exam findings -Intracranial hemorrhage, subdural/epidural hematoma: Canadian head CT score of 0, no neurodeficits -Vertebral fracture: No seatbelt sign, no midline tenderness, no step-off/crepitus/abnormalities palpated -Spinal cord injury:  no neuro deficits; CT negative -Skull fracture: No postauricular  ecchymosis, no periorbital ecchymosis, no hemotympanum -Fracture: No step-offs/crepitus/abnormalities palpated in head, neck, chest, upper extremities, lower extremities, pelvis  Risk Stratification Score:  Nexus C-spine: imaging obtained Canadian Head CT: 0   Social Determinants of Health:  none            Final Clinical Impression(s) / ED Diagnoses Final diagnoses:  Fall, initial encounter  Neck pain  Acute pain of right shoulder    Rx / DC Orders ED Discharge Orders     None         Dorthy Cooler, [place] 10/20/22 1600    Anders Simmonds T, DO 10/22/22 0014

## 2022-10-20 NOTE — ED Notes (Signed)
Pt hollering and curing at staff stating "that damn ibuprofen ain't going to work. This heifer over here calling security." Security called to assist pt to the exit.

## 2022-10-30 ENCOUNTER — Ambulatory Visit (AMBULATORY_SURGERY_CENTER): Payer: Medicare HMO

## 2022-10-30 VITALS — Ht 67.75 in | Wt 199.0 lb

## 2022-10-30 DIAGNOSIS — Z1211 Encounter for screening for malignant neoplasm of colon: Secondary | ICD-10-CM

## 2022-10-30 MED ORDER — NA SULFATE-K SULFATE-MG SULF 17.5-3.13-1.6 GM/177ML PO SOLN: 1.0000 | Freq: Once | ORAL | 0 refills | Status: AC

## 2022-10-30 NOTE — Progress Notes (Signed)
No egg or soy allergy known to patient  No issues known to pt with past sedation with any surgeries or procedures Patient denies ever being told they had issues or difficulty with intubation  No FH of Malignant Hyperthermia Pt is not on diet pills Pt is not on  home 02  Pt is not on blood thinners  Pt denies issues with chronic constipation  No A fib or A flutter Have any cardiac testing pending--no Patient's chart reviewed by Cathlyn Parsons CNRA prior to previsit and patient appropriate for the [facility].  Previsit completed and red dot placed by patient's name on their procedure day (on provider's schedule).    Pt instructed to use [email or web address] or GoodRx for a price reduction on prep  Sometimes needs cane or walker for ambulation

## 2022-11-20 ENCOUNTER — Encounter: Payer: 59 | Admitting: Internal Medicine

## 2022-12-24 ENCOUNTER — Ambulatory Visit (INDEPENDENT_AMBULATORY_CARE_PROVIDER_SITE_OTHER): Payer: Medicare HMO | Admitting: Physician Assistant

## 2022-12-24 DIAGNOSIS — M19011 Primary osteoarthritis, right shoulder: Secondary | ICD-10-CM

## 2022-12-24 MED ORDER — TRAMADOL HCL 50 MG PO TABS
50.0000 mg | ORAL_TABLET | Freq: Two times a day (BID) | ORAL | 2 refills | Status: DC | PRN
Start: 2022-12-24 — End: 2023-01-20

## 2022-12-24 MED ORDER — METHOCARBAMOL 750 MG PO TABS: 750.0000 mg | ORAL_TABLET | Freq: Two times a day (BID) | ORAL | 1 refills | Status: DC | PRN

## 2022-12-24 NOTE — Progress Notes (Signed)
Office Visit Note   Patient: Raven [name]           Date of Birth: 06-14-1960           MRN: [id number] Visit Date: 12/24/2022              Requested by: Donato Schultz, FNP [address] [place],  [place] [address] PCP: Donato Schultz, FNP   Assessment & Plan: Visit Diagnoses:  1. Arthritis of right shoulder region     Plan: X-rays reviewed are unremarkable for fracture.  Believe the patient has aggravated her underlying glenohumeral osteoarthritis and may also have a component of frozen shoulder as she has not been using this.  I would like to refer her to Dr. Shon Baton for glenohumeral cortisone injection.  She will follow-up with [place] as needed.  Follow-Up Instructions: Return if symptoms worsen or fail to improve.   Orders:  No orders of the defined types were placed in this encounter.  Meds ordered this encounter  Medications   traMADol (ULTRAM) 50 MG tablet    Sig: Take 1 tablet (50 mg total) by mouth every 12 (twelve) hours as needed.    Dispense:  30 tablet    Refill:  2   methocarbamol (ROBAXIN-750) 750 MG tablet    Sig: Take 1 tablet (750 mg total) by mouth 2 (two) times daily as needed for muscle spasms.    Dispense:  20 tablet    Refill:  1      Procedures: No procedures performed   Clinical Data: No additional findings.   Subjective: Chief Complaint  Patient presents with   Right Shoulder - Pain    HPI patient is a pleasant 62 year old female who comes in today with right shoulder pain.  Symptoms began after sustaining a fall landing on her right side on 10/20/2022.  She has had pain to the right shoulder and into the deltoid since.  This is constant but worse with any movement of the shoulder.  She has been taking Advil and Tylenol without relief.  Review of Systems as detailed in HPI.  All others reviewed and are negative.   Objective: Vital Signs: LMP  (LMP Unknown)   Physical Exam well-developed well-nourished female no acute  distress.  Alert and oriented x 3.  Ortho Exam right shoulder exam: She is exhibiting guarding but does appear to have approximately 90 degrees of forward flexion.  She is neurovascularly intact distally.  Specialty Comments:  No specialty comments available.  Imaging: X-rays reviewed by me in canopy show advanced degenerative changes to the before meals and glenohumeral joints.  No superior migration of the humeral head.   PMFS History: Patient Active Problem List   Diagnosis Date Noted   Need for pneumococcal vaccine 10/07/2021   Screen for colon cancer 10/07/2021   Need for prophylactic vaccination with combined diphtheria-tetanus-pertussis (DTP) vaccine 10/07/2021   Need for prophylactic vaccination and inoculation against varicella 10/07/2021   Encounter for general adult medical examination with abnormal findings 10/07/2021   Diabetic nephropathy associated with type 2 diabetes mellitus (HCC) 05/31/2021   Type II diabetes mellitus with manifestations (HCC) 05/31/2021   Cervical cancer screening 05/30/2021   Visit for screening mammogram 05/30/2021   Abnormal electrocardiogram (ECG) (EKG) 05/30/2021   Chronic bilateral low back pain without sciatica 01/18/2021   Thiamine deficiency neuropathy 04/10/2020   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   Severe episode of recurrent major depressive disorder, with psychotic features (HCC) 02/24/2020  Hyperlipidemia with target LDL less than 130 02/23/2020   Primary hypertension 02/23/2020   Cocaine abuse (HCC) 02/23/2020   Glaucoma of both eyes 02/23/2020   Stage 3a chronic kidney disease (HCC) 02/23/2020   Primary osteoarthritis of left knee 06/10/2016   Gastroesophageal reflux disease without esophagitis 12/15/2015   Urothelial carcinoma (HCC) 05/04/2015   Severe recurrent major depressive disorder with psychotic features (HCC) 10/13/2014   Primary insomnia 08/17/2014   Anxiety and depression 05/13/2014   Open-angle glaucoma 05/13/2014    Diabetes mellitus, type 2 (HCC) 03/03/2014   Hypothyroidism 03/03/2014   Past Medical History:  Diagnosis Date   Anxiety    Arthritis    all joints and thumbs   Bladder tumor    Cataract    Glaucoma of both eyes    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   Hyperlipidemia    OSA (obstructive sleep apnea)    per pt study 2008  no cpap   Ovarian cancer (HCC)    Post-surgical hypothyroidism    Severe recurrent major depression with psychotic features (HCC)    followed by psychologist--  dr Archer Asa   Sleep apnea    Type 2 diabetes mellitus (HCC)    Wears dentures    upper   Wears glasses     Family History  Problem Relation Age of Onset   Diabetes Mother    Stroke Mother    Heart disease Mother    Alcohol abuse Father    Colon cancer Father    Cancer Sister    Alcohol abuse Sister    Alcohol abuse Brother    Drug abuse Brother    Alcohol abuse Brother    Drug abuse Brother    Esophageal cancer Neg Hx    Colon polyps Neg Hx    Rectal cancer Neg Hx    Stomach cancer Neg Hx     Past Surgical History:  Procedure Laterality Date   COLONOSCOPY  Nov 2016   STRABISMUS SURGERY Bilateral age 87   THYROIDECTOMY  85   TRANSURETHRAL RESECTION OF BLADDER TUMOR N/A 04/28/2015   Procedure: TRANSURETHRAL RESECTION OF BLADDER TUMOR (TURBT) ;  Surgeon: Marcine Matar, MD;  Location: [facility];  Service: Urology;  Laterality: N/A;   Social History   Occupational History   Not on file  Tobacco Use   Smoking status: Every Day    Current packs/day: 0.35    Average packs/day: 0.4 packs/day for 13.0 years (4.6 ttl pk-yrs)    Types: Cigarettes    Passive exposure: Past   Smokeless tobacco: Never  Vaping Use   Vaping status: Never Used  Substance and Sexual Activity   Alcohol use: Not Currently   Drug use: Not Currently   Sexual activity: Never    Partners: Male

## 2022-12-30 ENCOUNTER — Encounter: Payer: Self-pay | Admitting: Sports Medicine

## 2022-12-30 ENCOUNTER — Other Ambulatory Visit: Payer: Self-pay

## 2022-12-30 ENCOUNTER — Ambulatory Visit (INDEPENDENT_AMBULATORY_CARE_PROVIDER_SITE_OTHER): Payer: Medicare HMO | Admitting: Sports Medicine

## 2022-12-30 DIAGNOSIS — M7501 Adhesive capsulitis of right shoulder: Secondary | ICD-10-CM

## 2022-12-30 DIAGNOSIS — G8929 Other chronic pain: Secondary | ICD-10-CM | POA: Diagnosis not present

## 2022-12-30 DIAGNOSIS — M25511 Pain in right shoulder: Secondary | ICD-10-CM | POA: Diagnosis not present

## 2022-12-30 DIAGNOSIS — M19011 Primary osteoarthritis, right shoulder: Secondary | ICD-10-CM | POA: Diagnosis not present

## 2022-12-30 MED ORDER — BUPIVACAINE HCL 0.25 % IJ SOLN
2.0000 mL | INTRAMUSCULAR | Status: AC | PRN
  Administered 2022-12-30: 2 mL via INTRA_ARTICULAR

## 2022-12-30 MED ORDER — LIDOCAINE HCL 1 % IJ SOLN
2.0000 mL | INTRAMUSCULAR | Status: AC | PRN
  Administered 2022-12-30: 2 mL

## 2022-12-30 MED ORDER — METHYLPREDNISOLONE ACETATE 40 MG/ML IJ SUSP
40.0000 mg | INTRAMUSCULAR | Status: AC | PRN
  Administered 2022-12-30: 40 mg via INTRA_ARTICULAR

## 2022-12-30 NOTE — Progress Notes (Signed)
Office & Procedure Note  Patient: Raven [name]             Date of Birth: [birth year]           MRN: [id number]             Visit Date: 12/30/2022  HPI: Raven [name] is a pleasant 62 year-old female who presents for evaluation of right shoulder pain.  At previous x-rays, was seen by Mikey Kirschner -concern for possible frozen shoulder versus osteoarthritis aggravation.  She was prescribed tramadol to take for breakthrough pain as well as Robaxin.  Patient is a type II diabetic.  Lab Results  Component Value Date   HGBA1C 6.4 08/30/2021   PE:  -Generalized tenderness palpating all along the shoulder joint and posterior shoulder.  There is restriction with both active and passive range of motion in all directions.  No redness swelling or effusion.  Imaging:  *Independent review and interpretation of 3 view shoulder x-ray from 10/20/2022 was performed by myself today.  There is moderate to severe arthritic change of the glenohumeral joint as well as the [facility] joint.  The humeral head is well located.  No acute fracture noted.  Narrative & Impression  CLINICAL DATA:  Fall with shoulder pain.   EXAM: RIGHT SHOULDER - 2+ VIEW   COMPARISON:  Chest radiograph dated 08/30/2021.   FINDINGS: There is no evidence of fracture or dislocation. Degenerative changes are seen of the glenohumeral joint and acromioclavicular joint. Soft tissues are unremarkable.   IMPRESSION: No acute osseous injury. Degenerative changes.     Electronically Signed   By: Romona Curls M.D.   On: 10/20/2022 15:12   Visit Diagnoses:  1. Adhesive capsulitis of right shoulder   2. Arthritis of right shoulder region   3. Chronic right shoulder pain    Procedures:  Large Joint Inj: R glenohumeral on 12/30/2022 10:15 AM Indications: pain Details: 22 G 3.5 in needle, ultrasound-guided posterior approach Medications: 2 mL lidocaine 1 %; 2 mL bupivacaine 0.25 %; 40 mg methylPREDNISolone acetate 40 MG/ML Outcome:  tolerated well, no immediate complications  US-guided glenohumeral joint injection, right shoulder After discussion on risks/benefits/indications, informed verbal consent was obtained. A timeout was then performed. The patient was positioned lying lateral recumbent on examination table. The patient's shoulder was prepped with betadine and multiple alcohol swabs and utilizing ultrasound guidance, the patient's glenohumeral joint was identified on ultrasound. Using ultrasound guidance a 22-gauge, 3.5 inch needle with a mixture of 2:2:1 cc's lidocaine:bupivicaine:depomedrol was directed from a lateral to medial direction via in-plane technique into the glenohumeral joint with visualization of appropriate spread of injectate into the joint. Patient tolerated the procedure well without immediate complications.      Procedure, treatment alternatives, risks and benefits explained, specific risks discussed. Consent was given by the patient. Immediately prior to procedure a time out was called to verify the correct patient, procedure, equipment, support staff and site/side marked as required. Patient was prepped and draped in the usual sterile fashion.     Plan:  -Discussed with Chandelle that I believe she is dealing with likely a component of adhesive capsulitis.  She does have advanced arthritic change of the glenohumeral joint, but per her story her shoulder pain and range of motion was rather insidious. -Through shared decision making, we did proceed with ultrasound-guided right shoulder injection.  -She may continue her tramadol 50 mg once to twice daily as needed for breakthrough pain. -Was sent for formalized physical therapy to  help work on range of motion and stability about the shoulder -She will follow-up with Tessa Lerner in 4-6 weeks as needed for the shoulder  Madelyn Brunner, DO Primary Care Sports Medicine Physician  [facility] - [facility]  This note was dictated using Dragon  naturally speaking software and may contain errors in syntax, spelling, or content which have not been identified prior to signing this note.

## 2023-01-17 ENCOUNTER — Telehealth: Payer: Self-pay | Admitting: Physician Assistant

## 2023-01-17 NOTE — Telephone Encounter (Signed)
Pt called requesting a stronger dose of tramadol. Pt states the strength she taking is not working and pain management informed her to call [facility] for stronger dose. Please sen to [facility] on [address]. Pt phone number is [phone].

## 2023-01-20 ENCOUNTER — Other Ambulatory Visit: Payer: Self-pay | Admitting: Physician Assistant

## 2023-01-20 MED ORDER — TRAMADOL HCL 50 MG PO TABS: 50.0000 mg | ORAL_TABLET | Freq: Two times a day (BID) | ORAL | 2 refills | Status: DC | PRN

## 2023-01-20 NOTE — Telephone Encounter (Signed)
I sent in new rx.  If she needs something stronger she will need to go to pain mgmt.  Why are we writing this anyway if she sees pain mgmt?

## 2023-01-20 NOTE — Telephone Encounter (Signed)
Spoke with patient. Explained that the refill was made, but nothing stronger can be sent in from our office. She got very upset and states that pain management told her we needed to make the adjusments, not them. I told her that we will not and cannot increase her medications. Directed her to speak with her pain management provider.

## 2023-01-27 ENCOUNTER — Ambulatory Visit: Payer: Medicare HMO | Attending: Sports Medicine

## 2023-01-27 ENCOUNTER — Other Ambulatory Visit: Payer: Self-pay

## 2023-01-27 DIAGNOSIS — M6281 Muscle weakness (generalized): Secondary | ICD-10-CM | POA: Diagnosis present

## 2023-01-27 DIAGNOSIS — M7501 Adhesive capsulitis of right shoulder: Secondary | ICD-10-CM | POA: Diagnosis not present

## 2023-01-27 DIAGNOSIS — M25511 Pain in right shoulder: Secondary | ICD-10-CM | POA: Diagnosis present

## 2023-01-27 DIAGNOSIS — R293 Abnormal posture: Secondary | ICD-10-CM | POA: Diagnosis present

## 2023-01-27 DIAGNOSIS — G8929 Other chronic pain: Secondary | ICD-10-CM | POA: Diagnosis not present

## 2023-01-27 DIAGNOSIS — M19011 Primary osteoarthritis, right shoulder: Secondary | ICD-10-CM | POA: Diagnosis not present

## 2023-01-27 NOTE — Therapy (Signed)
OUTPATIENT PHYSICAL THERAPY SHOULDER EVALUATION   Patient Name: Raven [name] MRN: [id number] DOB:13-Apr-1960, 62 y.o., female Today's Date: 01/27/2023  END OF SESSION:  PT End of Session - 01/27/23 1138     Visit Number 1    Number of Visits 15    Date for PT Re-Evaluation 03/24/23    Authorization Type [employer] MCR    PT Start Time 1100    PT Stop Time 1130    PT Time Calculation (min) 30 min    Activity Tolerance Patient limited by pain             Past Medical History:  Diagnosis Date   Anxiety    Arthritis    all joints and thumbs   Bladder tumor    Cataract    Glaucoma of both eyes    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   Hyperlipidemia    OSA (obstructive sleep apnea)    per pt study 2008  no cpap   Ovarian cancer (HCC)    Post-surgical hypothyroidism    Severe recurrent major depression with psychotic features [facility])    followed by psychologist--  dr Archer Asa   Sleep apnea    Type 2 diabetes mellitus (HCC)    Wears dentures    upper   Wears glasses    Past Surgical History:  Procedure Laterality Date   COLONOSCOPY  Nov 2016   STRABISMUS SURGERY Bilateral age 61   THYROIDECTOMY  2004   TRANSURETHRAL RESECTION OF BLADDER TUMOR N/A 04/28/2015   Procedure: TRANSURETHRAL RESECTION OF BLADDER TUMOR (TURBT) ;  Surgeon: Marcine Matar, MD;  Location: [facility];  Service: Urology;  Laterality: N/A;   Patient Active Problem List   Diagnosis Date Noted   Need for pneumococcal vaccine 10/07/2021   Screen for colon cancer 10/07/2021   Need for prophylactic vaccination with combined diphtheria-tetanus-pertussis (DTP) vaccine 10/07/2021   Need for prophylactic vaccination and inoculation against varicella 10/07/2021   Encounter for general adult medical examination with abnormal findings 10/07/2021   Diabetic nephropathy associated with type 2 diabetes mellitus (HCC) 05/31/2021   Type II diabetes  mellitus with manifestations (HCC) 05/31/2021   Cervical cancer screening 05/30/2021   Visit for screening mammogram 05/30/2021   Abnormal electrocardiogram (ECG) (EKG) 05/30/2021   Chronic bilateral low back pain without sciatica 01/18/2021   Thiamine deficiency neuropathy 04/10/2020   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   Severe episode of recurrent major depressive disorder, with psychotic features (HCC) 02/24/2020   Hyperlipidemia with target LDL less than 130 02/23/2020   Primary hypertension 02/23/2020   Cocaine abuse (HCC) 02/23/2020   Glaucoma of both eyes 02/23/2020   Stage 3a chronic kidney disease (HCC) 02/23/2020   Primary osteoarthritis of left knee 06/10/2016   Gastroesophageal reflux disease without esophagitis 12/15/2015   Urothelial carcinoma (HCC) 05/04/2015   Severe recurrent major depressive disorder with psychotic features (HCC) 10/13/2014   Primary insomnia 08/17/2014   Anxiety and depression 05/13/2014   Open-angle glaucoma 05/13/2014   Diabetes mellitus, type 2 (HCC) 03/03/2014   Hypothyroidism 03/03/2014    PCP: Donato Schultz, FNP  REFERRING PROVIDER: Madelyn Brunner, DO   REFERRING DIAG:  M19.011 (ICD-10-CM) - Arthritis of right shoulder region M75.01 (ICD-10-CM) - Adhesive capsulitis of right shoulder M25.511,G89.29 (ICD-10-CM) - Chronic right shoulder pain  THERAPY DIAG:  Right shoulder pain, unspecified chronicity - Plan: PT plan of care cert/re-cert  Muscle weakness (generalized) -  Plan: PT plan of care cert/re-cert  Abnormal posture - Plan: PT plan of care cert/re-cert  Rationale for Evaluation and Treatment: Rehabilitation  ONSET DATE: August 2024   SUBJECTIVE:                                                                                                                                                                                      SUBJECTIVE STATEMENT: Pt presents to PT with reports of 3 month hx of R shoulder pain after fall. Notes  most discomfort in R shoulder when lifting past 90 degrees or when dressing. Has occasional N/T in R UE down to R hand. Denies acute bowel/bladder changes or saddle anesthesia.   Hand dominance: Left  PERTINENT HISTORY: HTN, Previous Cancer, DM II  PAIN:  Are you having pain?  Yes: NPRS scale: 8/10 Worst: 10/10 Pain location: R anterior shoulder Pain description: sharp Aggravating factors: dressing, reaching overhead Relieving factors: lidocaine patch, heat/ice  PRECAUTIONS: None  RED FLAGS: None   WEIGHT BEARING RESTRICTIONS: No  FALLS:  Has patient fallen in last 6 months? Yes. Number of falls - one leading to current R shoulder pain  LIVING ENVIRONMENT: Lives with: lives with their family Lives in: House/apartment  OCCUPATION: Retired  PLOF: Independent  PATIENT GOALS: decrease her R shoulder pain in order to more comfortably participate in praise worship at her church  OBJECTIVE:  Note: Objective measures were completed at Evaluation unless otherwise noted.  DIAGNOSTIC FINDINGS:  See imaging   PATIENT SURVEYS:  FOTO: 28% function; 56% predicted   COGNITION: Overall cognitive status: Within functional limits for tasks assessed     SENSATION: Light touch: Impaired - R UE  POSTURE: Rounded shoulder, fwd head  UPPER EXTREMITY ROM:   Active ROM Right eval Left eval  Shoulder flexion 70 WFL  Shoulder extension    Shoulder abduction 65 WFL  Shoulder adduction    Shoulder internal rotation 20 WFL  Shoulder external rotation 20 WFL  Elbow flexion    Elbow extension    Wrist flexion    Wrist extension    Wrist ulnar deviation    Wrist radial deviation    Wrist pronation    Wrist supination    (Blank rows = not tested)  UPPER EXTREMITY MMT:  MMT Right eval Left eval  Shoulder flexion 2+/5 4/5  Shoulder extension    Shoulder abduction 2+/5 4/5  Shoulder adduction    Shoulder internal rotation 2+/5 4/5  Shoulder external rotation 2+/5 4/5   Middle trapezius    Lower trapezius    Elbow flexion    Elbow extension    Wrist flexion    Wrist  extension    Wrist ulnar deviation    Wrist radial deviation    Wrist pronation    Wrist supination    Grip strength (lbs)    (Blank rows = not tested)  SHOULDER SPECIAL TESTS: Impingement tests: Painful arc test: positive  SLAP lesions: DNT Instability tests: DNT Rotator cuff assessment: DNT Biceps assessment: DNT  JOINT MOBILITY TESTING:  R GH hypomobility  PALPATION:  Severe TTP to R infraspinatus   TREATMENT: [facility] Adult PT Treatment:                                                DATE: 01/27/2023 Therapeutic Exercise: Seated dow ER x 10 AAROM Supine shoulder flex with dow x 5 - small range Seated scapular retraction x 5 - increased pain Seated row - unable due to pain  PATIENT EDUCATION: Education details: eval findings, FOTO, HEP, POC Person educated: Patient Education method: Explanation, Demonstration, and Handouts Education comprehension: verbalized understanding and returned demonstration  HOME EXERCISE PROGRAM: Access Code: [email or web address] URL: [email or web address] Date: 01/27/2023 Prepared by: Edwinna Areola  Exercises - Seated Shoulder External Rotation AAROM with Dowel  - 2 x daily - 7 x weekly - 2 sets - 10 reps - Supine Shoulder Flexion Extension AAROM with Dowel  - 2 x daily - 7 x weekly - 2 sets - 10 reps - Seated Scapular Retraction  - 2 x daily - 7 x weekly - 2 sets - 10 reps  ASSESSMENT:  CLINICAL IMPRESSION: Patient is a 62 y.o. F who was seen today for physical therapy evaluation and treatment for acute on chronic R shoulder pain. Physical findings are consistent with physician impression as pt demonstrates significant decrease in R shoulder ROM and function. Assessment limited secondary to severe pain. FOTO score demonstrates decrease in subjective functional ability below PLOF. Pt would benefit from skilled PT services working on  improving R shoulder ROM, strength, and function.  OBJECTIVE IMPAIRMENTS: decreased activity tolerance, decreased mobility, decreased ROM, decreased strength, impaired UE functional use, improper body mechanics, and pain.   ACTIVITY LIMITATIONS: carrying, lifting, bed mobility, bathing, toileting, dressing, reach over head, and hygiene/grooming  PARTICIPATION LIMITATIONS: meal prep, cleaning, driving, shopping, community activity, occupation, and yard work  PERSONAL FACTORS: Fitness, Time since onset of injury/illness/exacerbation, and 1-2 comorbidities: HTN, Previous Cancer, DM II  are also affecting patient's functional outcome.   REHAB POTENTIAL: Fair - secondary to pain severity on eval and complex PMH  CLINICAL DECISION MAKING: Evolving/moderate complexity  EVALUATION COMPLEXITY: Moderate   GOALS: Goals reviewed with patient? No  SHORT TERM GOALS: Target date: 02/17/2023   Pt will be compliant and knowledgeable with initial HEP for improved comfort and carryover Baseline: initial HEP given  Goal status: INITIAL  2.  Pt will self report right shoulder pain no greater than 7/10 for improved comfort and functional ability Baseline: 10/10 at worst Goal status: INITIAL   Weekly TERM GOALS: Target date: 03/24/2023   Pt will improve FOTO function score to no less than 56% as proxy for functional improvement Baseline: 28% function Goal status: INITIAL   2.  Pt will self report right shoulder pain no greater than 4/10 for improved comfort and functional ability Baseline: 10/10 at worst Goal status: INITIAL   3.  Pt will improve R shoulder flexion to no less than 120 degrees to improve ability to reach  into cabinets and perform self care Baseline: see ROM chart Goal status: INITIAL  4.  Pt will improve R shoulder MMT to no less than 3+/5 for improved dynamic stability with OH movements Baseline: see MMT chart Goal status: INITIAL  5.  Pt will be able to participate in praise  worship at church not limited by shoulder pain for improved comfort with desired community activities Baseline: unable Goal status: INITIAL  PLAN:  PT FREQUENCY: 1-2x/week  PT DURATION: 8 weeks  PLANNED INTERVENTIONS: 97164- PT Re-evaluation, 97110-Therapeutic exercises, 97530- Therapeutic activity, 97112- Neuromuscular re-education, 97535- Self Care, 66440- Manual therapy, [id number]- Aquatic Therapy, 97014- Electrical stimulation (unattended), [id number]- Electrical stimulation (manual), 97016- Vasopneumatic device, Dry Needling, Cryotherapy, and Moist heat  PLAN FOR NEXT SESSION: assess HEP response, R shoulder PROM and AAROM, shoulder/periscapular strengthening    Eloy End, PT 01/27/2023, 11:41 AM

## 2023-01-29 NOTE — Progress Notes (Signed)
.  Declined SDOH   Pt has not taken BP meds today and having holiday traveling anxiety due to she is on her way to [place], [place]
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
## 2023-02-04 ENCOUNTER — Ambulatory Visit (HOSPITAL_COMMUNITY): Payer: Self-pay | Admitting: Psychiatry

## 2023-02-05 ENCOUNTER — Ambulatory Visit: Payer: Medicare HMO | Attending: Sports Medicine | Admitting: Physical Therapy

## 2023-02-05 ENCOUNTER — Encounter: Payer: Self-pay | Admitting: Physical Therapy

## 2023-02-05 DIAGNOSIS — M25511 Pain in right shoulder: Secondary | ICD-10-CM | POA: Diagnosis present

## 2023-02-05 DIAGNOSIS — M6281 Muscle weakness (generalized): Secondary | ICD-10-CM | POA: Insufficient documentation

## 2023-02-05 DIAGNOSIS — R293 Abnormal posture: Secondary | ICD-10-CM | POA: Insufficient documentation

## 2023-02-05 NOTE — Therapy (Signed)
OUTPATIENT PHYSICAL THERAPY TREATMENT NOTE   Patient Name: Raven [name] MRN: [id number] DOB:25-Dec-1960, 62 y.o., female Today's Date: 02/05/2023  END OF SESSION:  PT End of Session - 02/05/23 1531     Visit Number 2    Number of Visits 15    Date for PT Re-Evaluation 03/24/23    Authorization Type [employer] MCR    PT Start Time 1532    PT Stop Time 1603    PT Time Calculation (min) 31 min    Activity Tolerance Patient limited by pain;No increased pain              Past Medical History:  Diagnosis Date   Anxiety    Arthritis    all joints and thumbs   Bladder tumor    Cataract    Glaucoma of both eyes    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   Hyperlipidemia    OSA (obstructive sleep apnea)    per pt study 2008  no cpap   Ovarian cancer (HCC)    Post-surgical hypothyroidism    Severe recurrent major depression with psychotic features [facility])    followed by psychologist--  dr Archer Asa   Sleep apnea    Type 2 diabetes mellitus (HCC)    Wears dentures    upper   Wears glasses    Past Surgical History:  Procedure Laterality Date   COLONOSCOPY  Nov 2016   STRABISMUS SURGERY Bilateral age 53   THYROIDECTOMY  70   TRANSURETHRAL RESECTION OF BLADDER TUMOR N/A 04/28/2015   Procedure: TRANSURETHRAL RESECTION OF BLADDER TUMOR (TURBT) ;  Surgeon: Marcine Matar, MD;  Location: [facility];  Service: Urology;  Laterality: N/A;   Patient Active Problem List   Diagnosis Date Noted   Need for pneumococcal vaccine 10/07/2021   Screen for colon cancer 10/07/2021   Need for prophylactic vaccination with combined diphtheria-tetanus-pertussis (DTP) vaccine 10/07/2021   Need for prophylactic vaccination and inoculation against varicella 10/07/2021   Encounter for general adult medical examination with abnormal findings 10/07/2021   Diabetic nephropathy associated with type 2 diabetes mellitus (HCC) 05/31/2021    Type II diabetes mellitus with manifestations (HCC) 05/31/2021   Cervical cancer screening 05/30/2021   Visit for screening mammogram 05/30/2021   Abnormal electrocardiogram (ECG) (EKG) 05/30/2021   Chronic bilateral low back pain without sciatica 01/18/2021   Thiamine deficiency neuropathy 04/10/2020   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   Severe episode of recurrent major depressive disorder, with psychotic features (HCC) 02/24/2020   Hyperlipidemia with target LDL less than 130 02/23/2020   Primary hypertension 02/23/2020   Cocaine abuse (HCC) 02/23/2020   Glaucoma of both eyes 02/23/2020   Stage 3a chronic kidney disease (HCC) 02/23/2020   Primary osteoarthritis of left knee 06/10/2016   Gastroesophageal reflux disease without esophagitis 12/15/2015   Urothelial carcinoma (HCC) 05/04/2015   Severe recurrent major depressive disorder with psychotic features (HCC) 10/13/2014   Primary insomnia 08/17/2014   Anxiety and depression 05/13/2014   Open-angle glaucoma 05/13/2014   Diabetes mellitus, type 2 (HCC) 03/03/2014   Hypothyroidism 03/03/2014    PCP: Donato Schultz, FNP  REFERRING PROVIDER: Madelyn Brunner, DO   REFERRING DIAG:  M19.011 (ICD-10-CM) - Arthritis of right shoulder region M75.01 (ICD-10-CM) - Adhesive capsulitis of right shoulder M25.511,G89.29 (ICD-10-CM) - Chronic right shoulder pain  THERAPY DIAG:  Right shoulder pain, unspecified chronicity  Muscle weakness (generalized)  Abnormal posture  Rationale for Evaluation and Treatment: Rehabilitation  ONSET DATE: August 2024   SUBJECTIVE:                                                                                                                                                                                     Per eval - Pt presents to PT with reports of 3 month hx of R shoulder pain after fall. Notes most discomfort in R shoulder when lifting past 90 degrees or when dressing. Has occasional N/T in R UE down  to R hand. Denies acute bowel/bladder changes or saddle anesthesia.   Hand dominance: Left  SUBJECTIVE STATEMENT: 02/05/2023 Pt states she is more painful due to the weather. Has been doing HEP some, seems to be helpful. Notes tramadol has been helpful as well. No other new updates, states she would [facility] to [facility]  PERTINENT HISTORY: HTN, Previous Cancer, DM II  PAIN:  Are you having pain?  Yes: NPRS scale: 5/10 Worst: 10/10 Pain location: R anterior shoulder Pain description: sharp Aggravating factors: dressing, reaching overhead Relieving factors: lidocaine patch, heat/ice  PRECAUTIONS: None  RED FLAGS: None   WEIGHT BEARING RESTRICTIONS: No  FALLS:  Has patient fallen in last 6 months? Yes. Number of falls - one leading to current R shoulder pain  LIVING ENVIRONMENT: Lives with: lives with their family Lives in: House/apartment  OCCUPATION: Retired  PLOF: Independent  PATIENT GOALS: decrease her R shoulder pain in order to more comfortably participate in praise worship at her church  OBJECTIVE:  Note: Objective measures were completed at Evaluation unless otherwise noted.  DIAGNOSTIC FINDINGS:  See imaging   PATIENT SURVEYS:  FOTO: 28% function; 56% predicted   COGNITION: Overall cognitive status: Within functional limits for tasks assessed     SENSATION: Light touch: Impaired - R UE  POSTURE: Rounded shoulder, fwd head  UPPER EXTREMITY ROM:   Active ROM Right eval Left eval  Shoulder flexion 70 WFL  Shoulder extension    Shoulder abduction 65 WFL  Shoulder adduction    Shoulder internal rotation 20 WFL  Shoulder external rotation 20 WFL  Elbow flexion    Elbow extension    Wrist flexion    Wrist extension    Wrist ulnar deviation    Wrist radial deviation    Wrist pronation    Wrist supination    (Blank rows = not tested)  UPPER EXTREMITY MMT:  MMT Right eval Left eval  Shoulder flexion 2+/5 4/5  Shoulder extension     Shoulder abduction 2+/5 4/5  Shoulder adduction    Shoulder internal rotation 2+/5 4/5  Shoulder external rotation 2+/5 4/5  Middle trapezius    Lower trapezius    Elbow flexion    Elbow extension    Wrist flexion    Wrist extension    Wrist ulnar deviation    Wrist radial deviation    Wrist pronation    Wrist supination    Grip strength (lbs)    (Blank rows = not tested)  SHOULDER SPECIAL TESTS: Impingement tests: Painful arc test: positive  SLAP lesions: DNT Instability tests: DNT Rotator cuff assessment: DNT Biceps assessment: DNT  JOINT MOBILITY TESTING:  R GH hypomobility  PALPATION:  Severe TTP to R infraspinatus   TREATMENT: [facility] Adult PT Treatment:                                                DATE: 02/05/23 Therapeutic Exercise: Seated ER AAROM w/ 2x10 Supine dowel flexion mini range x8 Supine dowel chest press x8 mini ROM Scapular retraction x12 cues for posture and arm position Shoulder abduction isometric 2x8  Sidelying ER AROM to neutral only x12 HEP update + education   [facility] Adult PT Treatment:                                                DATE: 01/27/2023 Therapeutic Exercise: Seated dow ER x 10 AAROM Supine shoulder flex with dow x 5 - small range Seated scapular retraction x 5 - increased pain Seated row - unable due to pain  PATIENT EDUCATION: Education details: rationale for interventions, HEP  Person educated: Patient Education method: Explanation, Demonstration, Tactile cues, Verbal cues Education comprehension: verbalized understanding, returned demonstration, verbal cues required, tactile cues required, and needs further education     HOME EXERCISE PROGRAM: Access Code: [email or web address] URL: [email or web address] Date: 02/05/2023 Prepared by: Fransisco Hertz  Exercises - Seated Shoulder External Rotation AAROM with Dowel  - 2 x daily - 7 x weekly - 2 sets - 10 reps - Supine Shoulder Flexion Extension AAROM with Dowel  - 2 x  daily - 7 x weekly - 2 sets - 10 reps - Seated Scapular Retraction  - 2 x daily - 7 x weekly - 2 sets - 10 reps - Standing Isometric Shoulder Abduction with Doorway - Arm Bent  - 2 x daily - 7 x weekly - 1 sets - 8 reps  ASSESSMENT:  CLINICAL IMPRESSION: 02/05/2023 Pt arrives w/ 5/10 pain, endorses increased pain with weather but does state HEP has been helpful. Today focusing on GH mobility within tolerable ROM and introduction of isometrics, pt reporting improved tolerance with repetition. Endorses reduced pain on departure, no adverse events. Recommend continuing along current POC in order to address relevant deficits and improve functional tolerance. Pt departs today's session in no acute distress, all voiced questions/concerns addressed appropriately from PT perspective.    Per eval - Patient is a 62 y.o. F who was seen today for physical therapy evaluation and treatment for acute on chronic R shoulder pain. Physical findings are consistent with physician impression as pt demonstrates significant decrease in R shoulder ROM and function. Assessment limited secondary to severe pain. FOTO score demonstrates decrease in subjective functional ability below PLOF. Pt would benefit from skilled PT services working on improving R shoulder ROM, strength, and function.  OBJECTIVE IMPAIRMENTS: decreased activity tolerance, decreased mobility, decreased ROM, decreased strength, impaired UE functional use, improper body mechanics, and pain.   ACTIVITY LIMITATIONS: carrying, lifting, bed mobility, bathing, toileting, dressing, reach over head, and hygiene/grooming  PARTICIPATION LIMITATIONS: meal prep, cleaning, driving, shopping, community activity, occupation, and yard work  PERSONAL FACTORS: Fitness, Time since onset of injury/illness/exacerbation, and 1-2 comorbidities: HTN, Previous Cancer, DM II  are also affecting patient's functional outcome.   REHAB POTENTIAL: Fair - secondary to pain severity on  eval and complex PMH  CLINICAL DECISION MAKING: Evolving/moderate complexity  EVALUATION COMPLEXITY: Moderate   GOALS: Goals reviewed with patient? No  SHORT TERM GOALS: Target date: 02/17/2023   Pt will be compliant and knowledgeable with initial HEP for improved comfort and carryover Baseline: initial HEP given  Goal status: INITIAL  2.  Pt will self report right shoulder pain no greater than 7/10 for improved comfort and functional ability Baseline: 10/10 at worst Goal status: INITIAL   [place] TERM GOALS: Target date: 03/24/2023   Pt will improve FOTO function score to no less than 56% as proxy for functional improvement Baseline: 28% function Goal status: INITIAL   2.  Pt will self report right shoulder pain no greater than 4/10 for improved comfort and functional ability Baseline: 10/10 at worst Goal status: INITIAL   3.  Pt will improve R shoulder flexion to no less than 120 degrees to improve ability to reach into cabinets and perform self care Baseline: see ROM chart Goal status: INITIAL  4.  Pt will improve R shoulder MMT to no less than 3+/5 for improved dynamic stability with OH movements Baseline: see MMT chart Goal status: INITIAL  5.  Pt will be able to participate in praise worship at church not limited by shoulder pain for improved comfort with desired community activities Baseline: unable Goal status: INITIAL  PLAN:  PT FREQUENCY: 1-2x/week  PT DURATION: 8 weeks  PLANNED INTERVENTIONS: 97164- PT Re-evaluation, 97110-Therapeutic exercises, 97530- Therapeutic activity, 97112- Neuromuscular re-education, 97535- Self Care, [id number]- Manual therapy, [id number]- Aquatic Therapy, 97014- Electrical stimulation (unattended), [id number]- Electrical stimulation (manual), 97016- Vasopneumatic device, Dry Needling, Cryotherapy, and Moist heat  PLAN FOR NEXT SESSION: HEP update/review PRN. R shoulder PROM and AAROM, shoulder/periscapular strengthening     Ashley Murrain PT,  DPT 02/05/2023 4:13 PM

## 2023-02-11 NOTE — Therapy (Signed)
OUTPATIENT PHYSICAL THERAPY TREATMENT NOTE   Patient Name: Raven [name] MRN: [id number] DOB:23-Mar-1960, 62 y.o., female Today's Date: 02/12/2023  END OF SESSION:  PT End of Session - 02/12/23 1532     Visit Number 3    Number of Visits 15    Date for PT Re-Evaluation 03/24/23    Authorization Type [employer]et[employer] MCR    PT Start Time 1532    PT Stop Time 1610    PT Time Calculation (min) 38 min    Activity Tolerance Patient limited by pain;No increased pain               Past Medical History:  Diagnosis Date   Anxiety    Arthritis    all joints and thumbs   Bladder tumor    Cataract    Glaucoma of both eyes    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   Hyperlipidemia    OSA (obstructive sleep apnea)    per pt study 2008  no cpap   Ovarian cancer (HCC)    Post-surgical hypothyroidism    Severe recurrent major depression with psychotic features [facility])    followed by psychologist--  dr Archer Asa   Sleep apnea    Type 2 diabetes mellitus (HCC)    Wears dentures    upper   Wears glasses    Past Surgical History:  Procedure Laterality Date   COLONOSCOPY  Nov 2016   STRABISMUS SURGERY Bilateral age 55   THYROIDECTOMY  26   TRANSURETHRAL RESECTION OF BLADDER TUMOR N/A 04/28/2015   Procedure: TRANSURETHRAL RESECTION OF BLADDER TUMOR (TURBT) ;  Surgeon: Marcine Matar, MD;  Location: [facility];  Service: Urology;  Laterality: N/A;   Patient Active Problem List   Diagnosis Date Noted   Need for pneumococcal vaccine 10/07/2021   Screen for colon cancer 10/07/2021   Need for prophylactic vaccination with combined diphtheria-tetanus-pertussis (DTP) vaccine 10/07/2021   Need for prophylactic vaccination and inoculation against varicella 10/07/2021   Encounter for general adult medical examination with abnormal findings 10/07/2021   Diabetic nephropathy associated with type 2 diabetes mellitus (HCC) 05/31/2021    Type II diabetes mellitus with manifestations (HCC) 05/31/2021   Cervical cancer screening 05/30/2021   Visit for screening mammogram 05/30/2021   Abnormal electrocardiogram (ECG) (EKG) 05/30/2021   Chronic bilateral low back pain without sciatica 01/18/2021   Thiamine deficiency neuropathy 04/10/2020   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   Severe episode of recurrent major depressive disorder, with psychotic features (HCC) 02/24/2020   Hyperlipidemia with target LDL less than 130 02/23/2020   Primary hypertension 02/23/2020   Cocaine abuse (HCC) 02/23/2020   Glaucoma of both eyes 02/23/2020   Stage 3a chronic kidney disease (HCC) 02/23/2020   Primary osteoarthritis of left knee 06/10/2016   Gastroesophageal reflux disease without esophagitis 12/15/2015   Urothelial carcinoma (HCC) 05/04/2015   Severe recurrent major depressive disorder with psychotic features (HCC) 10/13/2014   Primary insomnia 08/17/2014   Anxiety and depression 05/13/2014   Open-angle glaucoma 05/13/2014   Diabetes mellitus, type 2 (HCC) 03/03/2014   Hypothyroidism 03/03/2014    PCP: Donato Schultz, FNP  REFERRING PROVIDER: Madelyn Brunner, DO   REFERRING DIAG:  M19.011 (ICD-10-CM) - Arthritis of right shoulder region M75.01 (ICD-10-CM) - Adhesive capsulitis of right shoulder M25.511,G89.29 (ICD-10-CM) - Chronic right shoulder pain  THERAPY DIAG:  Right shoulder pain, unspecified chronicity  Muscle weakness (generalized)  Abnormal posture  Rationale for Evaluation and Treatment: Rehabilitation  ONSET DATE: August 2024   SUBJECTIVE:                                                                                                                                                                                     Per eval - Pt presents to PT with reports of 3 month hx of R shoulder pain after fall. Notes most discomfort in R shoulder when lifting past 90 degrees or when dressing. Has occasional N/T in R UE down  to R hand. Denies acute bowel/bladder changes or saddle anesthesia.   Hand dominance: Left  SUBJECTIVE STATEMENT: 02/12/2023 Pt states she was a bit sore for a couple days after last session, felt productive. No pain today. Doing well with HEP, states isometrics have been helpful.    PERTINENT HISTORY: HTN, Previous Cancer, DM II  PAIN:  Are you having pain?  Yes: NPRS scale: 0/10 Worst: 10/10 Pain location: R anterior shoulder Pain description: sharp Aggravating factors: dressing, reaching overhead Relieving factors: lidocaine patch, heat/ice  PRECAUTIONS: None  RED FLAGS: None   WEIGHT BEARING RESTRICTIONS: No  FALLS:  Has patient fallen in last 6 months? Yes. Number of falls - one leading to current R shoulder pain  LIVING ENVIRONMENT: Lives with: lives with their family Lives in: House/apartment  OCCUPATION: Retired  PLOF: Independent  PATIENT GOALS: decrease her R shoulder pain in order to more comfortably participate in praise worship at her church  OBJECTIVE:  Note: Objective measures were completed at Evaluation unless otherwise noted.  DIAGNOSTIC FINDINGS:  See imaging   PATIENT SURVEYS:  FOTO: 28% function; 56% predicted   COGNITION: Overall cognitive status: Within functional limits for tasks assessed     SENSATION: Light touch: Impaired - R UE  POSTURE: Rounded shoulder, fwd head  UPPER EXTREMITY ROM:   Active ROM Right eval Left eval Right 02/12/23  Shoulder flexion 70 WFL 109 deg *  Shoulder extension     Shoulder abduction 65 WFL 98 deg *  Shoulder adduction     Shoulder internal rotation 20 WFL   Shoulder external rotation 20 WFL   Elbow flexion     Elbow extension     Wrist flexion     Wrist extension     Wrist ulnar deviation     Wrist radial deviation     Wrist pronation     Wrist supination     (Blank rows = not tested)  UPPER EXTREMITY MMT:  MMT Right eval Left eval  Shoulder flexion 2+/5 4/5  Shoulder  extension    Shoulder abduction 2+/5 4/5  Shoulder adduction    Shoulder  internal rotation 2+/5 4/5  Shoulder external rotation 2+/5 4/5  Middle trapezius    Lower trapezius    Elbow flexion    Elbow extension    Wrist flexion    Wrist extension    Wrist ulnar deviation    Wrist radial deviation    Wrist pronation    Wrist supination    Grip strength (lbs)    (Blank rows = not tested)  SHOULDER SPECIAL TESTS: Impingement tests: Painful arc test: positive  SLAP lesions: DNT Instability tests: DNT Rotator cuff assessment: DNT Biceps assessment: DNT  JOINT MOBILITY TESTING:  R GH hypomobility  PALPATION:  Severe TTP to R infraspinatus   TREATMENT: [facility] Adult PT Treatment:                                                DATE: 02/12/23 Therapeutic Exercise: Seated shoulder flexion AAROM to ~90 deg 2x10 cues for posture  Supine chest press x8 w/ dowel  Red band ER iso walkout 2x5 Unresisted double ER + scapular retraction 2x8 Shoulder row red band 2x10 cues for posture Shoulder abd iso x8 Shoulder flexion iso x8 at wall  HEP update + education/handout    [facility] Adult PT Treatment:                                                DATE: 02/05/23 Therapeutic Exercise: Seated ER AAROM w/ 2x10 Supine dowel flexion mini range x8 Supine dowel chest press x8 mini ROM Scapular retraction x12 cues for posture and arm position Shoulder abduction isometric 2x8  Sidelying ER AROM to neutral only x12 HEP update + education   [facility] Adult PT Treatment:                                                DATE: 01/27/2023 Therapeutic Exercise: Seated dow ER x 10 AAROM Supine shoulder flex with dow x 5 - small range Seated scapular retraction x 5 - increased pain Seated row - unable due to pain  PATIENT EDUCATION: Education details: rationale for interventions, HEP  Person educated: Patient Education method: Explanation, Demonstration, Tactile cues, Verbal cues Education comprehension:  verbalized understanding, returned demonstration, verbal cues required, tactile cues required, and needs further education     HOME EXERCISE PROGRAM: Access Code: [email or web address] URL: [email or web address] Date: 02/12/2023 Prepared by: Fransisco Hertz  Exercises - Supine Shoulder Flexion Extension AAROM with Dowel  - 2 x daily - 7 x weekly - 1 sets - 10 reps - Standing Isometric Shoulder Abduction with Doorway - Arm Bent  - 2 x daily - 7 x weekly - 1 sets - 8 reps - Shoulder External Rotation and Scapular Retraction  - 2 x daily - 7 x weekly - 1 sets - 8 reps - Standing Shoulder Row with Anchored Resistance  - 2 x daily - 7 x weekly - 1 sets - 8 reps  ASSESSMENT:  CLINICAL IMPRESSION: 02/12/2023 Pt arrives w/o pain - endorses some soreness after last session but overall improving. AROM measurements with notable improvement compared to initial eval. Today continuing to progress [facility]  mobility/strengthening with cues for form and pacing but overall does quite well, no increase in pain. No adverse events, pt denies any pain on departure. Recommend continuing along current POC in order to address relevant deficits and improve functional tolerance. Pt departs today's session in no acute distress, all voiced questions/concerns addressed appropriately from PT perspective.    Per eval - Patient is a 62 y.o. F who was seen today for physical therapy evaluation and treatment for acute on chronic R shoulder pain. Physical findings are consistent with physician impression as pt demonstrates significant decrease in R shoulder ROM and function. Assessment limited secondary to severe pain. FOTO score demonstrates decrease in subjective functional ability below PLOF. Pt would benefit from skilled PT services working on improving R shoulder ROM, strength, and function.  OBJECTIVE IMPAIRMENTS: decreased activity tolerance, decreased mobility, decreased ROM, decreased strength, impaired UE functional use,  improper body mechanics, and pain.   ACTIVITY LIMITATIONS: carrying, lifting, bed mobility, bathing, toileting, dressing, reach over head, and hygiene/grooming  PARTICIPATION LIMITATIONS: meal prep, cleaning, driving, shopping, community activity, occupation, and yard work  PERSONAL FACTORS: Fitness, Time since onset of injury/illness/exacerbation, and 1-2 comorbidities: HTN, Previous Cancer, DM II  are also affecting patient's functional outcome.   REHAB POTENTIAL: Fair - secondary to pain severity on eval and complex PMH  CLINICAL DECISION MAKING: Evolving/moderate complexity  EVALUATION COMPLEXITY: Moderate   GOALS: Goals reviewed with patient? No  SHORT TERM GOALS: Target date: 02/17/2023   Pt will be compliant and knowledgeable with initial HEP for improved comfort and carryover Baseline: initial HEP given  Goal status: INITIAL  2.  Pt will self report right shoulder pain no greater than 7/10 for improved comfort and functional ability Baseline: 10/10 at worst Goal status: INITIAL   [place] TERM GOALS: Target date: 03/24/2023   Pt will improve FOTO function score to no less than 56% as proxy for functional improvement Baseline: 28% function Goal status: INITIAL   2.  Pt will self report right shoulder pain no greater than 4/10 for improved comfort and functional ability Baseline: 10/10 at worst Goal status: INITIAL   3.  Pt will improve R shoulder flexion to no less than 120 degrees to improve ability to reach into cabinets and perform self care Baseline: see ROM chart Goal status: INITIAL  4.  Pt will improve R shoulder MMT to no less than 3+/5 for improved dynamic stability with OH movements Baseline: see MMT chart Goal status: INITIAL  5.  Pt will be able to participate in praise worship at church not limited by shoulder pain for improved comfort with desired community activities Baseline: unable Goal status: INITIAL  PLAN:  PT FREQUENCY: 1-2x/week  PT  DURATION: 8 weeks  PLANNED INTERVENTIONS: 97164- PT Re-evaluation, 97110-Therapeutic exercises, 97530- Therapeutic activity, 97112- Neuromuscular re-education, 97535- Self Care, 13244- Manual therapy, [id number]- Aquatic Therapy, 97014- Electrical stimulation (unattended), [id number]- Electrical stimulation (manual), 97016- Vasopneumatic device, Dry Needling, Cryotherapy, and Moist heat  PLAN FOR NEXT SESSION: HEP update/review PRN. R shoulder PROM and AAROM, shoulder/periscapular strengthening      Ashley Murrain PT, DPT 02/12/2023 4:16 PM

## 2023-02-12 ENCOUNTER — Ambulatory Visit: Payer: Medicare HMO | Admitting: Physical Therapy

## 2023-02-12 ENCOUNTER — Encounter: Payer: Self-pay | Admitting: Physical Therapy

## 2023-02-12 DIAGNOSIS — M25511 Pain in right shoulder: Secondary | ICD-10-CM | POA: Diagnosis not present

## 2023-02-12 DIAGNOSIS — M6281 Muscle weakness (generalized): Secondary | ICD-10-CM

## 2023-02-12 DIAGNOSIS — R293 Abnormal posture: Secondary | ICD-10-CM

## 2023-02-18 NOTE — Therapy (Addendum)
 OUTPATIENT PHYSICAL THERAPY TREATMENT NOTE  + NO VISIT DISCHARGE SUMMARY (see below)    Patient Name: Raven [name] MRN: [id number] DOB:January 16, 1961, 62 y.o., female Today's Date: 02/19/2023   END OF SESSION:  PT End of Session - 02/19/23 1528     Visit Number 4    Number of Visits 15    Date for PT Re-Evaluation 03/24/23    Authorization Type [employer] MCR    Progress Note Due on Visit 10    PT Start Time 1534    PT Stop Time 1546    PT Time Calculation (min) 12 min                Past Medical History:  Diagnosis Date   Anxiety    Arthritis    all joints and thumbs   Bladder tumor    Cataract    Glaucoma of both eyes    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   Hyperlipidemia    OSA (obstructive sleep apnea)    per pt study 2008  no cpap   Ovarian cancer (HCC)    Post-surgical hypothyroidism    Severe recurrent major depression with psychotic features [facility])    followed by psychologist--  dr Archer Asa   Sleep apnea    Type 2 diabetes mellitus (HCC)    Wears dentures    upper   Wears glasses    Past Surgical History:  Procedure Laterality Date   COLONOSCOPY  Nov 2016   STRABISMUS SURGERY Bilateral age 26   THYROIDECTOMY  21   TRANSURETHRAL RESECTION OF BLADDER TUMOR N/A 04/28/2015   Procedure: TRANSURETHRAL RESECTION OF BLADDER TUMOR (TURBT) ;  Surgeon: Marcine Matar, MD;  Location: [facility];  Service: Urology;  Laterality: N/A;   Patient Active Problem List   Diagnosis Date Noted   Need for pneumococcal vaccine 10/07/2021   Screen for colon cancer 10/07/2021   Need for prophylactic vaccination with combined diphtheria-tetanus-pertussis (DTP) vaccine 10/07/2021   Need for prophylactic vaccination and inoculation against varicella 10/07/2021   Encounter for general adult medical examination with abnormal findings 10/07/2021   Diabetic nephropathy associated with type 2 diabetes mellitus  (HCC) 05/31/2021   Type II diabetes mellitus with manifestations (HCC) 05/31/2021   Cervical cancer screening 05/30/2021   Visit for screening mammogram 05/30/2021   Abnormal electrocardiogram (ECG) (EKG) 05/30/2021   Chronic bilateral low back pain without sciatica 01/18/2021   Thiamine deficiency neuropathy 04/10/2020   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   Severe episode of recurrent major depressive disorder, with psychotic features (HCC) 02/24/2020   Hyperlipidemia with target LDL less than 130 02/23/2020   Primary hypertension 02/23/2020   Cocaine abuse (HCC) 02/23/2020   Glaucoma of both eyes 02/23/2020   Stage 3a chronic kidney disease (HCC) 02/23/2020   Primary osteoarthritis of left knee 06/10/2016   Gastroesophageal reflux disease without esophagitis 12/15/2015   Urothelial carcinoma (HCC) 05/04/2015   Severe recurrent major depressive disorder with psychotic features (HCC) 10/13/2014   Primary insomnia 08/17/2014   Anxiety and depression 05/13/2014   Open-angle glaucoma 05/13/2014   Diabetes mellitus, type 2 (HCC) 03/03/2014   Hypothyroidism 03/03/2014    PCP: Donato Schultz, FNP  REFERRING PROVIDER: Madelyn Brunner, DO   REFERRING DIAG:  M19.011 (ICD-10-CM) - Arthritis of right shoulder region M75.01 (ICD-10-CM) - Adhesive capsulitis of right shoulder M25.511,G89.29 (ICD-10-CM) - Chronic right shoulder pain  THERAPY DIAG:  Right shoulder pain,  unspecified chronicity  Muscle weakness (generalized)  Abnormal posture  Rationale for Evaluation and Treatment: Rehabilitation  ONSET DATE: August 2024   SUBJECTIVE:                                                                                                                                                                                     Per eval - Pt presents to PT with reports of 3 month hx of R shoulder pain after fall. Notes most discomfort in R shoulder when lifting past 90 degrees or when dressing. Has  occasional N/T in R UE down to R hand. Denies acute bowel/bladder changes or saddle anesthesia.   Hand dominance: Left  SUBJECTIVE STATEMENT: 02/19/2023 Pt states she felt good after last session, no soreness. No pain at present. HEP going well. Now mostly having trouble with overhead activity.    PERTINENT HISTORY: HTN, Previous Cancer, DM II  PAIN:  Are you having pain?  Yes: NPRS scale: 0/10 Worst: 7/10 Pain location: R anterior shoulder Pain description: sharp Aggravating factors: dressing, reaching overhead Relieving factors: lidocaine patch, heat/ice  PRECAUTIONS: None  RED FLAGS: None   WEIGHT BEARING RESTRICTIONS: No  FALLS:  Has patient fallen in last 6 months? Yes. Number of falls - one leading to current R shoulder pain  LIVING ENVIRONMENT: Lives with: lives with their family Lives in: House/apartment  OCCUPATION: Retired  PLOF: Independent  PATIENT GOALS: decrease her R shoulder pain in order to more comfortably participate in praise worship at her church  OBJECTIVE:  Note: Objective measures were completed at Evaluation unless otherwise noted.  DIAGNOSTIC FINDINGS:  See imaging   PATIENT SURVEYS:  FOTO: 28% function; 56% predicted  FOTO 02/19/23 38%  COGNITION: Overall cognitive status: Within functional limits for tasks assessed     SENSATION: Light touch: Impaired - R UE  POSTURE: Rounded shoulder, fwd head  UPPER EXTREMITY ROM:   Active ROM Right eval Left eval Right 02/12/23 Right 02/19/23  Shoulder flexion 70 WFL 109 deg * 123 deg *  Shoulder extension      Shoulder abduction 65 WFL 98 deg * 91 deg *  Shoulder adduction      Shoulder internal rotation 20 WFL    Shoulder external rotation 20 WFL    Elbow flexion      Elbow extension      Wrist flexion      Wrist extension      Wrist ulnar deviation      Wrist radial deviation      Wrist pronation      Wrist supination      (Blank rows = not tested)  UPPER EXTREMITY  MMT:  MMT Right eval Left eval R/L 02/19/23  Shoulder flexion 2+/5 4/5 3-/4  Shoulder extension     Shoulder abduction 2+/5 4/5   Shoulder adduction     Shoulder internal rotation 2+/5 4/5 4-*/4+  Shoulder external rotation 2+/5 4/5 4-/4+  Middle trapezius     Lower trapezius     Elbow flexion     Elbow extension     Wrist flexion     Wrist extension     Wrist ulnar deviation     Wrist radial deviation     Wrist pronation     Wrist supination     Grip strength (lbs)     (Blank rows = not tested)  SHOULDER SPECIAL TESTS: Impingement tests: Painful arc test: positive  SLAP lesions: DNT Instability tests: DNT Rotator cuff assessment: DNT Biceps assessment: DNT  JOINT MOBILITY TESTING:  R GH hypomobility  PALPATION:  Severe TTP to R infraspinatus   TREATMENT: [facility] Adult PT Treatment:                                                DATE: 02/19/23 Therapeutic Activity: MSK assessment + education FOTO + education Education/discussion re: progress with PT, symptom behavior as it affects activity tolerance, PT goals/POC     PATIENT EDUCATION: Education details: rationale for interventions, HEP, PT goals/POC and progress thus far Person educated: Patient Education method: Explanation, Demonstration, Tactile cues, Verbal cues Education comprehension: verbalized understanding, returned demonstration, verbal cues required, tactile cues required  HOME EXERCISE PROGRAM: Access Code: [email or web address] URL: [email or web address] Date: 02/12/2023 Prepared by: Fransisco Hertz  Exercises - Supine Shoulder Flexion Extension AAROM with Dowel  - 2 x daily - 7 x weekly - 1 sets - 10 reps - Standing Isometric Shoulder Abduction with Doorway - Arm Bent  - 2 x daily - 7 x weekly - 1 sets - 8 reps - Shoulder External Rotation and Scapular Retraction  - 2 x daily - 7 x weekly - 1 sets - 8 reps - Standing Shoulder Row with Anchored Resistance  - 2 x daily - 7 x weekly - 1 sets - 8  reps  ASSESSMENT:  CLINICAL IMPRESSION: 02/19/2023 Pt arrives w/ report of no resting pain, improving symptoms overall but still having limitations with overhead activity. Pt initially states she may want to discharge today as she will be out of town for the holidays - after going through goals and progress w/ PT thus far, we discuss possibility of placing PT on brief hold during her travels and resuming around the start of the year as her current POC is set for 03/24/23. She verbalizes agreement with this plan and states she will call us when she returns, voices understanding with no visit discharge policy. Today's session is shortened as pt states she has to pick her son up from train station so today we focus on MSK assessment and discussion/education re: POC and next steps forward. Pt is currently making good progress in regards to strength and ROM of R shoulder although continues with fairly severe pain at times. No adverse events, pt verbalizes agreement/understanding with current plan and departs today's session in no acute distress, all voiced questions/concerns addressed appropriately from PT perspective.     Per eval - Patient is a 62 y.o. F who was seen today for physical therapy evaluation and treatment for  acute on chronic R shoulder pain. Physical findings are consistent with physician impression as pt demonstrates significant decrease in R shoulder ROM and function. Assessment limited secondary to severe pain. FOTO score demonstrates decrease in subjective functional ability below PLOF. Pt would benefit from skilled PT services working on improving R shoulder ROM, strength, and function.  OBJECTIVE IMPAIRMENTS: decreased activity tolerance, decreased mobility, decreased ROM, decreased strength, impaired UE functional use, improper body mechanics, and pain.   ACTIVITY LIMITATIONS: carrying, lifting, bed mobility, bathing, toileting, dressing, reach over head, and  hygiene/grooming  PARTICIPATION LIMITATIONS: meal prep, cleaning, driving, shopping, community activity, occupation, and yard work  PERSONAL FACTORS: Fitness, Time since onset of injury/illness/exacerbation, and 1-2 comorbidities: HTN, Previous Cancer, DM II  are also affecting patient's functional outcome.   REHAB POTENTIAL: Fair - secondary to pain severity on eval and complex PMH  CLINICAL DECISION MAKING: Evolving/moderate complexity  EVALUATION COMPLEXITY: Moderate   GOALS: Goals reviewed with patient? No  SHORT TERM GOALS: Target date: 02/17/2023   Pt will be compliant and knowledgeable with initial HEP for improved comfort and carryover Baseline: initial HEP given  02/19/23: reports good HEP adherence Goal status: MET  2.  Pt will self report right shoulder pain no greater than 7/10 for improved comfort and functional ability Baseline: 10/10 at worst 02/19/23: 7/10 worst Goal status: MET  [place] TERM GOALS: Target date: 03/24/2023   Pt will improve FOTO function score to no less than 56% as proxy for functional improvement Baseline: 28% function 02/19/23: 38% Goal status: ONGOING  2.  Pt will self report right shoulder pain no greater than 4/10 for improved comfort and functional ability Baseline: 10/10 at worst 02/19/23: 7/10 Goal status: ONGOING  3.  Pt will improve R shoulder flexion to no less than 120 degrees to improve ability to reach into cabinets and perform self care Baseline: see ROM chart 02/19/23: see ROM chart Goal status: MET  4.  Pt will improve R shoulder MMT to no less than 3+/5 for improved dynamic stability with OH movements Baseline: see MMT chart 02/19/23: see MMT chart Goal status: ONGOING  5.  Pt will be able to participate in praise worship at church not limited by shoulder pain for improved comfort with desired community activities Baseline: unable 02/19/23: pt states she is still having trouble with this Goal status: NOT  MET  PLAN:  PT FREQUENCY: 1-2x/week  PT DURATION: 8 weeks  PLANNED INTERVENTIONS: 97164- PT Re-evaluation, 97110-Therapeutic exercises, 97530- Therapeutic activity, 97112- Neuromuscular re-education, 97535- Self Care, 16109- Manual therapy, [id number]- Aquatic Therapy, 97014- Electrical stimulation (unattended), [id number]- Electrical stimulation (manual), 97016- Vasopneumatic device, Dry Needling, Cryotherapy, and Moist heat  PLAN FOR NEXT SESSION: HEP update/review PRN. R shoulder PROM and AAROM, shoulder/periscapular strengthening      Ashley Murrain PT, DPT 02/19/2023 3:54 PM     Discharge addendum 05/21/2023  PHYSICAL THERAPY DISCHARGE SUMMARY  Visits from Start of Care: 4  Current functional level related to goals / functional outcomes: Unable to be assessed   Remaining deficits: Unable to be assessed   Education / Equipment: Unable to be assessed  Patient goals were unable to be assessed. Patient is being discharged due to not returning since the last visit.   Ashley Murrain PT, DPT 05/21/2023 1:58 PM

## 2023-02-19 ENCOUNTER — Encounter: Payer: Self-pay | Admitting: Physical Therapy

## 2023-02-19 ENCOUNTER — Ambulatory Visit: Payer: Medicare HMO | Admitting: Physical Therapy

## 2023-02-19 DIAGNOSIS — M6281 Muscle weakness (generalized): Secondary | ICD-10-CM

## 2023-02-19 DIAGNOSIS — M25511 Pain in right shoulder: Secondary | ICD-10-CM | POA: Diagnosis not present

## 2023-02-19 DIAGNOSIS — R293 Abnormal posture: Secondary | ICD-10-CM

## 2023-02-24 ENCOUNTER — Ambulatory Visit: Payer: Medicare HMO | Admitting: Physical Therapy

## 2023-04-04 NOTE — Progress Notes (Signed)
 The patient attended 01/29/23 screening event where her BP screening results was 163/83. At the event the patient noted (whater noted). Patient declined SDOH questionnaire. Per chart review (info found in chart pcp found, last ov with pcp). Chart review also indicates(future appt). No additional Health equity team support indicated at this time.

## 2023-04-17 ENCOUNTER — Ambulatory Visit: Payer: Medicare HMO | Attending: Sports Medicine

## 2023-04-17 NOTE — Therapy (Incomplete)
OUTPATIENT PHYSICAL THERAPY TREATMENT RE-EVAL NOTE   Patient Name: Raven [name] MRN: [id number] DOB:29-May-1960, 63 y.o., female Today's Date: 04/17/2023   END OF SESSION:       Past Medical History:  Diagnosis Date   Anxiety    Arthritis    all joints and thumbs   Bladder tumor    Cataract    Glaucoma of both eyes    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   Hyperlipidemia    OSA (obstructive sleep apnea)    per pt study 2008  no cpap   Ovarian cancer (HCC)    Post-surgical hypothyroidism    Severe recurrent major depression with psychotic features [facility])    followed by psychologist--  dr Archer Asa   Sleep apnea    Type 2 diabetes mellitus (HCC)    Wears dentures    upper   Wears glasses    Past Surgical History:  Procedure Laterality Date   COLONOSCOPY  Nov 2016   STRABISMUS SURGERY Bilateral age 55   THYROIDECTOMY  39   TRANSURETHRAL RESECTION OF BLADDER TUMOR N/A 04/28/2015   Procedure: TRANSURETHRAL RESECTION OF BLADDER TUMOR (TURBT) ;  Surgeon: Marcine Matar, MD;  Location: [facility];  Service: Urology;  Laterality: N/A;   Patient Active Problem List   Diagnosis Date Noted   Need for pneumococcal vaccine 10/07/2021   Screen for colon cancer 10/07/2021   Need for prophylactic vaccination with combined diphtheria-tetanus-pertussis (DTP) vaccine 10/07/2021   Need for prophylactic vaccination and inoculation against varicella 10/07/2021   Encounter for general adult medical examination with abnormal findings 10/07/2021   Diabetic nephropathy associated with type 2 diabetes mellitus (HCC) 05/31/2021   Type II diabetes mellitus with manifestations (HCC) 05/31/2021   Cervical cancer screening 05/30/2021   Visit for screening mammogram 05/30/2021   Abnormal electrocardiogram (ECG) (EKG) 05/30/2021   Chronic bilateral low back pain without sciatica 01/18/2021   Thiamine deficiency neuropathy  04/10/2020   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   Severe episode of recurrent major depressive disorder, with psychotic features (HCC) 02/24/2020   Hyperlipidemia with target LDL less than 130 02/23/2020   Primary hypertension 02/23/2020   Cocaine abuse (HCC) 02/23/2020   Glaucoma of both eyes 02/23/2020   Stage 3a chronic kidney disease (HCC) 02/23/2020   Primary osteoarthritis of left knee 06/10/2016   Gastroesophageal reflux disease without esophagitis 12/15/2015   Urothelial carcinoma (HCC) 05/04/2015   Severe recurrent major depressive disorder with psychotic features (HCC) 10/13/2014   Primary insomnia 08/17/2014   Anxiety and depression 05/13/2014   Open-angle glaucoma 05/13/2014   Diabetes mellitus, type 2 (HCC) 03/03/2014   Hypothyroidism 03/03/2014    PCP: Donato Schultz, FNP  REFERRING PROVIDER: Madelyn Brunner, DO   REFERRING DIAG:  M19.011 (ICD-10-CM) - Arthritis of right shoulder region M75.01 (ICD-10-CM) - Adhesive capsulitis of right shoulder M25.511,G89.29 (ICD-10-CM) - Chronic right shoulder pain  THERAPY DIAG:  No diagnosis found.  Rationale for Evaluation and Treatment: Rehabilitation  ONSET DATE: August 2024   SUBJECTIVE:    Hand-dominance: left  SUBJECTIVE STATEMENT:  04/17/2023:   PERTINENT HISTORY: HTN, Previous Cancer, DM II  PAIN:  Are you having pain?  Yes: NPRS scale:  Worst:  Pain location: R anterior shoulder Pain description: sharp Aggravating factors: dressing, reaching overhead Relieving factors: lidocaine patch, heat/ice  PRECAUTIONS: None  RED FLAGS: None   WEIGHT BEARING RESTRICTIONS: No  FALLS:  Has patient fallen in last 6 months? Yes. Number of falls - one leading to current R shoulder pain  LIVING ENVIRONMENT: Lives with: lives with their  family Lives in: House/apartment  OCCUPATION: Retired  PLOF: Independent  PATIENT GOALS: decrease her R shoulder pain in order to more comfortably participate in praise worship at her church  OBJECTIVE:  Note: Objective measures were completed at Evaluation unless otherwise noted.  DIAGNOSTIC FINDINGS:  See imaging   PATIENT SURVEYS:  QuickDASH:  COGNITION: Overall cognitive status: Within functional limits for tasks assessed     SENSATION:   POSTURE: Rounded shoulder, fwd head  UPPER EXTREMITY ROM:   Active ROM Right eval Left eval Right 02/12/23 Right 02/19/23 Right re-eval 04/17/23  Left re-eval 04/17/23   Shoulder flexion 70 WFL 109 deg * 123 deg *    Shoulder extension        Shoulder abduction 65 WFL 98 deg * 91 deg *    Shoulder adduction        Shoulder internal rotation 20 WFL      Shoulder external rotation 20 WFL      Elbow flexion        Elbow extension        Wrist flexion        Wrist extension        Wrist ulnar deviation        Wrist radial deviation        Wrist pronation        Wrist supination        (Blank rows = not tested)  UPPER EXTREMITY MMT:  MMT Right eval Left eval R/L 02/19/23 Right re-eval 04/17/23 Left re-eval 04/17/23    Shoulder flexion 2+/5 4/5 3-/4    Shoulder extension       Shoulder abduction 2+/5 4/5     Shoulder adduction       Shoulder internal rotation 2+/5 4/5 4-*/4+    Shoulder external rotation 2+/5 4/5 4-/4+    Middle trapezius       Lower trapezius       Elbow flexion       Elbow extension       Wrist flexion       Wrist extension       Wrist ulnar deviation       Wrist radial deviation       Wrist pronation       Wrist supination       Grip strength (lbs)       (Blank rows = not tested)  SHOULDER SPECIAL TESTS: Impingement tests: Painful arc test: positive  SLAP lesions: DNT Instability tests: DNT Rotator cuff assessment: DNT Biceps assessment: DNT  JOINT MOBILITY TESTING:    PALPATION:     TREATMENT TODAY:      PATIENT EDUCATION: Education details: rationale for interventions, HEP, PT goals/POC and progress thus far Person educated: Patient Education method: Explanation, Demonstration, Tactile cues, Verbal cues Education comprehension: verbalized understanding, returned demonstration, verbal cues required, tactile cues required  HOME EXERCISE PROGRAM: Access Code: [email or web address]   ASSESSMENT:  CLINICAL IMPRESSION: Re-eval:   OBJECTIVE IMPAIRMENTS:  decreased activity tolerance, decreased mobility, decreased ROM, decreased strength, impaired UE functional use, improper body mechanics, and pain.   ACTIVITY LIMITATIONS: carrying, lifting, bed mobility, bathing, toileting, dressing, reach over head, and hygiene/grooming  PARTICIPATION LIMITATIONS: meal prep, cleaning, driving, shopping, community activity, occupation, and yard work  PERSONAL FACTORS: Fitness, Time since onset of injury/illness/exacerbation, and 1-2 comorbidities: HTN, Previous Cancer, DM II  are also affecting patient's functional outcome.   REHAB POTENTIAL: Fair - secondary to pain severity on eval and complex PMH  CLINICAL DECISION MAKING: Evolving/moderate complexity  EVALUATION COMPLEXITY: Moderate   GOALS: Goals reviewed with patient? Yes  SHORT TERM GOALS: Target date:    Pt will be compliant and knowledgeable with initial HEP for improved comfort and carryover Baseline:  Goal status: INITIAL  2.  Pt will self report right shoulder pain no greater than /10 for improved comfort and functional ability Baseline:  Goal status: INITIAL  [place] TERM GOALS: Target date:   Pt will improve QuickDASH score by ( ) to show an improvement in functional ability according to MCID guidelines.   Baseline:   Goal Status: INITIAL   2.  Pt will self report right shoulder pain no greater than /10 for improved comfort and functional ability Baseline: Goal status: INITIAL  3.  Pt will improve R  shoulder flexion to no less than 120 degrees to improve ability to reach into cabinets and perform self care Baseline:  Goal status: INITIAL  4.  Pt will improve R shoulder MMT to no less than 3+/5 for improved dynamic stability with OH movements Baseline:  Goal status: INITIAL  PLAN:  PT FREQUENCY: 1-2x/week  PT DURATION: 8 weeks  PLANNED INTERVENTIONS: 97164- PT Re-evaluation, 97110-Therapeutic exercises, 97530- Therapeutic activity, [id number]- Neuromuscular re-education, 97535- Self Care, 16109- Manual therapy, [id number]- Aquatic Therapy, 97014- Electrical stimulation (unattended), [id number]- Electrical stimulation (manual), 97016- Vasopneumatic device, Dry Needling, Cryotherapy, and Moist heat  PLAN FOR NEXT SESSION: review/revise HEP.   Erin Hearing, Student-PT 04/17/2023, 9:41 AM

## 2023-04-22 ENCOUNTER — Ambulatory Visit (AMBULATORY_SURGERY_CENTER): Payer: Medicare HMO

## 2023-04-22 VITALS — Ht 67.5 in | Wt 212.0 lb

## 2023-04-22 DIAGNOSIS — Z860100 Personal history of colon polyps, unspecified: Secondary | ICD-10-CM

## 2023-04-22 DIAGNOSIS — Z800 Family history of malignant neoplasm of digestive organs: Secondary | ICD-10-CM

## 2023-04-22 MED ORDER — PEG 3350-KCL-NA BICARB-NACL 420 G PO SOLR: 4000.0000 mL | Freq: Once | ORAL | 0 refills | Status: AC

## 2023-04-22 MED ORDER — BISACODYL 5 MG PO TBEC: 5.0000 mg | DELAYED_RELEASE_TABLET | Freq: Every day | ORAL | Status: AC | PRN

## 2023-04-22 NOTE — Progress Notes (Signed)
 No egg or soy allergy known to patient  No issues known to pt with past sedation with any surgeries or procedures Patient denies ever being told they had issues or difficulty with intubation  No FH of Malignant Hyperthermia Pt is not on diet pills Pt is not on  home 02  Pt is not on blood thinners  Pt denies issues with constipation  No A fib or A flutter Have any cardiac testing pending--no Pt can ambulates with cane Pt denies use of chewing tobacco Discussed diabetic I weight loss medication holds Discussed NSAID holds Checked BMI Pt instructed to use Single[employer]com or GoodRx for a price reduction on prep  Patient's chart reviewed by Cathlyn Parsons CNRA prior to previsit and patient appropriate for the [facility].  Pre visit completed and red dot placed by patient's name on their procedure day (on provider's schedule).

## 2023-04-27 ENCOUNTER — Other Ambulatory Visit: Payer: Self-pay | Admitting: Physician Assistant

## 2023-04-28 ENCOUNTER — Other Ambulatory Visit: Payer: Self-pay | Admitting: Physician Assistant

## 2023-04-28 MED ORDER — TRAMADOL HCL 50 MG PO TABS
50.0000 mg | ORAL_TABLET | Freq: Two times a day (BID) | ORAL | 2 refills | Status: DC | PRN
Start: 2023-04-28 — End: 2023-08-26

## 2023-04-28 NOTE — Telephone Encounter (Signed)
 Just sent

## 2023-05-06 ENCOUNTER — Encounter: Payer: Self-pay | Admitting: Internal Medicine

## 2023-05-06 ENCOUNTER — Ambulatory Visit: Payer: Medicare HMO | Admitting: Internal Medicine

## 2023-05-06 VITALS — BP 129/59 | HR 70 | Temp 98.2°F | Resp 20 | Ht 67.0 in | Wt 212.0 lb

## 2023-05-06 DIAGNOSIS — Z860100 Personal history of colon polyps, unspecified: Secondary | ICD-10-CM

## 2023-05-06 DIAGNOSIS — D120 Benign neoplasm of cecum: Secondary | ICD-10-CM

## 2023-05-06 DIAGNOSIS — Z1211 Encounter for screening for malignant neoplasm of colon: Secondary | ICD-10-CM

## 2023-05-06 DIAGNOSIS — Z860101 Personal history of adenomatous and serrated colon polyps: Secondary | ICD-10-CM

## 2023-05-06 DIAGNOSIS — Z800 Family history of malignant neoplasm of digestive organs: Secondary | ICD-10-CM

## 2023-05-06 DIAGNOSIS — K5730 Diverticulosis of large intestine without perforation or abscess without bleeding: Secondary | ICD-10-CM | POA: Diagnosis not present

## 2023-05-06 MED ORDER — SODIUM CHLORIDE 0.9 % IV SOLN: 500.0000 mL | Freq: Once | INTRAVENOUS | Status: DC

## 2023-05-06 NOTE — Progress Notes (Signed)
 GASTROENTEROLOGY PROCEDURE H&P NOTE   Primary Care Physician: Donato Schultz, FNP    Reason for Procedure:   Hx of polyps, fam hx of colon cancer  Plan:    colonoscopy  Patient is appropriate for endoscopic procedure(s) in the ambulatory ([facility]) setting.  The nature of the procedure, as well as the risks, benefits, and alternatives were carefully and thoroughly reviewed with the patient. Ample time for discussion and questions allowed. The patient understood, was satisfied, and agreed to proceed.     HPI: Raven [name] is a 63 y.o. female who presents for colonoscopy.  Medical history as below.  Tolerated the prep.  No recent chest pain or shortness of breath.  No abdominal pain today.  Past Medical History:  Diagnosis Date   Anxiety    Arthritis    all joints and thumbs   Bladder tumor    Cataract    Glaucoma of both eyes    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   Hyperlipidemia    OSA (obstructive sleep apnea)    per pt study 2008  no cpap   Ovarian cancer (HCC)    Post-surgical hypothyroidism    Severe recurrent major depression with psychotic features [facility])    followed by psychologist--  dr Archer Asa   Sleep apnea    Type 2 diabetes mellitus (HCC)    Wears dentures    upper   Wears glasses     Past Surgical History:  Procedure Laterality Date   COLONOSCOPY  Nov 2016   STRABISMUS SURGERY Bilateral age 9   THYROIDECTOMY  38   TRANSURETHRAL RESECTION OF BLADDER TUMOR N/A 04/28/2015   Procedure: TRANSURETHRAL RESECTION OF BLADDER TUMOR (TURBT) ;  Surgeon: Marcine Matar, MD;  Location: [facility];  Service: Urology;  Laterality: N/A;    Prior to Admission medications   Medication Sig Start Date End Date Taking? Authorizing Provider  ALEVE 220 MG tablet Take 220-440 mg by mouth 2 (two) times daily as needed (for pain).   Yes [provider]  amLODipine (NORVASC) 5 MG tablet TAKE 1  TABLET (5 MG TOTAL) BY MOUTH DAILY. 12/07/21  Yes Etta Grandchild, MD  aspirin 81 MG EC tablet Take 1 tablet (81 mg total) by mouth daily. Swallow whole. 05/31/21  Yes Etta Grandchild, MD  atorvastatin (LIPITOR) 20 MG tablet TAKE 1 TABLET BY MOUTH EVERY DAY 12/07/21  Yes Etta Grandchild, MD  bimatoprost (LUMIGAN) 0.01 % SOLN Place 1 drop into both eyes at bedtime. 10/14/17  Yes Arlyce Harman, MD  buPROPion (WELLBUTRIN XL) 150 MG 24 hr tablet Take 150 mg by mouth daily. 10/18/22  Yes [provider]  latanoprost (XALATAN) 0.005 % ophthalmic solution Place 1 drop into both eyes at bedtime.   Yes [provider]  levothyroxine (SYNTHROID) 175 MCG tablet Take 1 tablet (175 mcg total) by mouth daily. 10/07/21  Yes Etta Grandchild, MD  lidocaine (LIDODERM) 5 % Place 1 patch onto the skin daily. Remove & Discard patch within 12 hours or as directed by MD 09/19/22  Yes Pricilla Loveless, MD  sertraline (ZOLOFT) 25 MG tablet Take 100 mg by mouth in the morning.   Yes [provider]  cyclobenzaprine (FLEXERIL) 5 MG tablet TAKE 1 TABLET BY MOUTH THREE TIMES A DAY AS NEEDED FOR MUSCLE SPASMS 12/07/21   Etta Grandchild, MD  diclofenac Sodium (VOLTAREN ARTHRITIS PAIN) 1 %  GEL Apply 2-4 g topically 4 (four) times daily as needed (for pain- left side).    [provider]  methocarbamol (ROBAXIN-750) 750 MG tablet Take 1 tablet (750 mg total) by mouth 2 (two) times daily as needed for muscle spasms. 12/24/22   Cristie Hem, PA-C  predniSONE (DELTASONE) 20 MG tablet 3 po once a day for 2 days, then 2 po once a day for 3 days, then 1 po once a day for 3 days Patient not taking: Reported on 04/22/2023 12/22/21   Cathren Laine, MD  thiamine 100 MG tablet Take 1 tablet (100 mg total) by mouth daily. Patient not taking: Reported on 04/22/2023 05/30/21   Etta Grandchild, MD  traMADol (ULTRAM) 50 MG tablet Take 1-2 tablets (50-100 mg total) by mouth every 12 (twelve) hours as needed. 04/28/23    Cristie Hem, PA-C  traZODone (DESYREL) 100 MG tablet Take 100 mg by mouth at bedtime.    [provider]    Current Outpatient Medications  Medication Sig Dispense Refill   ALEVE 220 MG tablet Take 220-440 mg by mouth 2 (two) times daily as needed (for pain).     amLODipine (NORVASC) 5 MG tablet TAKE 1 TABLET (5 MG TOTAL) BY MOUTH DAILY. 90 tablet 1   aspirin 81 MG EC tablet Take 1 tablet (81 mg total) by mouth daily. Swallow whole. 90 tablet 1   atorvastatin (LIPITOR) 20 MG tablet TAKE 1 TABLET BY MOUTH EVERY DAY 90 tablet 1   bimatoprost (LUMIGAN) 0.01 % SOLN Place 1 drop into both eyes at bedtime. 5 mL 0   buPROPion (WELLBUTRIN XL) 150 MG 24 hr tablet Take 150 mg by mouth daily.     latanoprost (XALATAN) 0.005 % ophthalmic solution Place 1 drop into both eyes at bedtime.     levothyroxine (SYNTHROID) 175 MCG tablet Take 1 tablet (175 mcg total) by mouth daily. 90 tablet 1   lidocaine (LIDODERM) 5 % Place 1 patch onto the skin daily. Remove & Discard patch within 12 hours or as directed by MD 30 patch 0   sertraline (ZOLOFT) 25 MG tablet Take 100 mg by mouth in the morning.     cyclobenzaprine (FLEXERIL) 5 MG tablet TAKE 1 TABLET BY MOUTH THREE TIMES A DAY AS NEEDED FOR MUSCLE SPASMS 270 tablet 0   diclofenac Sodium (VOLTAREN ARTHRITIS PAIN) 1 % GEL Apply 2-4 g topically 4 (four) times daily as needed (for pain- left side).     methocarbamol (ROBAXIN-750) 750 MG tablet Take 1 tablet (750 mg total) by mouth 2 (two) times daily as needed for muscle spasms. 20 tablet 1   predniSONE (DELTASONE) 20 MG tablet 3 po once a day for 2 days, then 2 po once a day for 3 days, then 1 po once a day for 3 days (Patient not taking: Reported on 04/22/2023) 15 tablet 0   thiamine 100 MG tablet Take 1 tablet (100 mg total) by mouth daily. (Patient not taking: Reported on 04/22/2023) 90 tablet 0   traMADol (ULTRAM) 50 MG tablet Take 1-2 tablets (50-100 mg total) by mouth every 12 (twelve) hours as  needed. 30 tablet 2   traZODone (DESYREL) 100 MG tablet Take 100 mg by mouth at bedtime.     Current Facility-Administered Medications  Medication Dose Route Frequency Provider Last Rate Last Admin   0.9 %  sodium chloride infusion  500 mL Intravenous Once Jinelle Butchko, Carie Caddy, MD       bisacodyl (DULCOLAX)  EC tablet 5 mg  5 mg Oral Daily PRN Damein Gaunce, Carie Caddy, MD        Allergies as of 05/06/2023   (No Known Allergies)    Family History  Problem Relation Age of Onset   Diabetes Mother    Stroke Mother    Heart disease Mother    Alcohol abuse Father    Colon cancer Father    Cancer Sister    Alcohol abuse Sister    Alcohol abuse Brother    Drug abuse Brother    Alcohol abuse Brother    Drug abuse Brother    Esophageal cancer Neg Hx    Colon polyps Neg Hx    Rectal cancer Neg Hx    Stomach cancer Neg Hx     Social History   Socioeconomic History   Marital status: Single    Spouse name: Not on file   Number of children: Not on file   Years of education: Not on file   Highest education level: Not on file  Occupational History   Not on file  Tobacco Use   Smoking status: Every Day    Current packs/day: 0.35    Average packs/day: 0.4 packs/day for 13.0 years (4.6 ttl pk-yrs)    Types: Cigarettes    Passive exposure: Past   Smokeless tobacco: Never  Vaping Use   Vaping status: Never Used  Substance and Sexual Activity   Alcohol use: Not Currently   Drug use: Not Currently   Sexual activity: Never    Partners: Male  Other Topics Concern   Not on file  Social History Narrative   Not on file   Social Drivers of Health   Financial Resource Strain: Not on File (10/09/2022)   Received from [employer]    Financial Resource Strain: 0  Food Insecurity: Patient Declined (01/29/2023)   Hunger Vital Sign    Worried About Running Out of Food in the Last Year: Patient declined    Ran Out of Food in the Last Year: Patient declined  Transportation Needs:  Patient Declined (01/29/2023)   PRAPARE - Administrator, Civil Service (Medical): Patient declined    Lack of Transportation (Non-Medical): Patient declined  Physical Activity: Not on File (10/09/2022)   Received from [facility]   Physical Activity    Physical Activity: 0  Stress: Not on File (10/09/2022)   Received from [facility]   Stress    Stress: 0  Social Connections: Not on File (11/14/2022)   Received from [facility]   Social Connections    Connectedness: 0  Intimate Partner Violence: Patient Declined (01/29/2023)   Humiliation, Afraid, Rape, and Kick questionnaire    Fear of Current or Ex-Partner: Patient declined    Emotionally Abused: Patient declined    Physically Abused: Patient declined    Sexually Abused: Patient declined    Physical Exam: Vital signs in last 24 hours: @BP  (!) 143/77   Pulse 70   Temp 98.2 F (36.8 C)   Ht 5\' 7"  (1.702 m)   Wt 212 lb (96.2 kg)   LMP  (LMP Unknown)   SpO2 98%   BMI 33.20 kg/m  GEN: NAD EYE: Sclerae anicteric ENT: MMM CV: Non-tachycardic Pulm: CTA b/l GI: Soft, NT/ND NEURO:  Alert & Oriented x 3   Erick Blinks, MD [facility]  05/06/2023 10:49 AM

## 2023-05-06 NOTE — Progress Notes (Signed)
 Report to PACU, RN, vss, BBS= Clear.

## 2023-05-06 NOTE — Op Note (Signed)
 [facility] Patient Name: Raven [name] Procedure Date: 05/06/2023 10:54 AM MRN: [id number] Endoscopist: Beverley Fiedler , MD, [id number] Age: 63 Referring MD:  Date of Birth: 04-10-1960 Gender: Female Account #: [id number] Procedure:                Colonoscopy Indications:              High risk colon cancer surveillance: Personal                            history of multiple adenomas, Family history of                            colon cancer in a first-degree relative (father),                            Last colonoscopy: June 2016 (TA x 3) Medicines:                Monitored Anesthesia Care Procedure:                Pre-Anesthesia Assessment:                           - Prior to the procedure, a History and Physical                            was performed, and patient medications and                            allergies were reviewed. The patient's tolerance of                            previous anesthesia was also reviewed. The risks                            and benefits of the procedure and the sedation                            options and risks were discussed with the patient.                            All questions were answered, and informed consent                            was obtained. Prior Anticoagulants: The patient has                            taken no anticoagulant or antiplatelet agents. ASA                            Grade Assessment: III - A patient with severe                            systemic disease. After reviewing the risks and  benefits, the patient was deemed in satisfactory                            condition to undergo the procedure.                           After obtaining informed consent, the colonoscope                            was passed under direct vision. Throughout the                            procedure, the patient's blood pressure, pulse, and                            oxygen saturations were  monitored continuously. The                            CF [id number] #[id number] was introduced through the anus                            and advanced to the cecum, identified by                            appendiceal orifice and ileocecal valve. The                            colonoscopy was performed without difficulty. The                            patient tolerated the procedure well. The quality                            of the bowel preparation was good. The ileocecal                            valve, appendiceal orifice, and rectum were                            photographed. Scope In: 10:57:22 AM Scope Out: 11:14:32 AM Scope Withdrawal Time: 0 hours 14 minutes 0 seconds  Total Procedure Duration: 0 hours 17 minutes 10 seconds  Findings:                 The digital rectal exam was normal.                           Two sessile polyps were found in the cecum. The                            polyps were 3 to 5 mm in size. These polyps were                            removed with a cold snare. Resection and retrieval  were complete.                           A few small-mouthed diverticula were found in the                            descending colon and transverse colon.                           The retroflexed view of the distal rectum and anal                            verge was normal and showed no anal or rectal                            abnormalities. Complications:            No immediate complications. Estimated Blood Loss:     Estimated blood loss: none. Impression:               - Two 3 to 5 mm polyps in the cecum, removed with a                            cold snare. Resected and retrieved.                           - Mild diverticulosis in the descending colon and                            in the transverse colon.                           - The distal rectum and anal verge are normal on                            retroflexion  view. Recommendation:           - Patient has a contact number available for                            emergencies. The signs and symptoms of potential                            delayed complications were discussed with the                            patient. Return to normal activities tomorrow.                            Written discharge instructions were provided to the                            patient.                           - Resume previous diet.                           -  Continue present medications.                           - Await pathology results.                           - Repeat colonoscopy in 5 years for surveillance. Beverley Fiedler, MD 05/06/2023 11:17:30 AM This report has been signed electronically.

## 2023-05-06 NOTE — Patient Instructions (Signed)
 YOU HAD AN ENDOSCOPIC PROCEDURE TODAY: Refer to the procedure report and other information in the discharge instructions given to you for any specific questions about what was found during the examination. If this information does not answer your questions, please call [place] office at [phone] to clarify.   YOU SHOULD EXPECT: Some feelings of bloating in the abdomen. Passage of more gas than usual. Walking can help get rid of the air that was put into your GI tract during the procedure and reduce the bloating. If you had a lower endoscopy (such as a colonoscopy or flexible sigmoidoscopy) you may notice spotting of blood in your stool or on the toilet paper. Some abdominal soreness may be present for a day or two, also.  DIET: Your first meal following the procedure should be a light meal and then it is ok to progress to your normal diet. A half-sandwich or bowl of soup is an example of a good first meal. Heavy or fried foods are harder to digest and may make you feel nauseous or bloated. Drink plenty of fluids but you should avoid alcoholic beverages for 24 hours. If you had a esophageal dilation, please see attached instructions for diet.    ACTIVITY: Your care partner should take you home directly after the procedure. You should plan to take it easy, moving slowly for the rest of the day. You can resume normal activity the day after the procedure however YOU SHOULD NOT DRIVE, use power tools, machinery or perform tasks that involve climbing or major physical exertion for 24 hours (because of the sedation medicines used during the test).   SYMPTOMS TO REPORT IMMEDIATELY: A gastroenterologist can be reached at any hour. Please call [phone]  for any of the following symptoms:  Following lower endoscopy y) Excessive amounts of blood in the stool  Significant tenderness, worsening of abdominal pains  Swelling of the abdomen that is new, acute  Fever of 100 or higher  FOLLOW UP:  If any  biopsies were taken you will be contacted by phone or by letter within the next 1-3 weeks. Call [phone]  if you have not heard about the biopsies in 3 weeks.  Please also call with any specific questions about appointments or follow up tests.

## 2023-05-06 NOTE — Progress Notes (Signed)
 Pt's states no medical or surgical changes since previsit or office visit.

## 2023-05-07 ENCOUNTER — Telehealth: Payer: Self-pay

## 2023-05-07 NOTE — Telephone Encounter (Signed)
 Attempted f/u call. No answer, left VM.

## 2023-05-08 ENCOUNTER — Encounter: Payer: Self-pay | Admitting: Internal Medicine

## 2023-05-08 LAB — SURGICAL PATHOLOGY

## 2023-07-01 ENCOUNTER — Ambulatory Visit (INDEPENDENT_AMBULATORY_CARE_PROVIDER_SITE_OTHER): Admitting: Physician Assistant

## 2023-07-01 ENCOUNTER — Encounter: Payer: Self-pay | Admitting: Physician Assistant

## 2023-07-01 DIAGNOSIS — M7501 Adhesive capsulitis of right shoulder: Secondary | ICD-10-CM | POA: Diagnosis not present

## 2023-07-01 DIAGNOSIS — M19011 Primary osteoarthritis, right shoulder: Secondary | ICD-10-CM | POA: Diagnosis not present

## 2023-07-01 NOTE — Progress Notes (Signed)
 Office Visit Note   Patient: Raven [name]           Date of Birth: [birth year]           MRN: [id number] Visit Date: 07/01/2023              Requested by: Luba Running, FNP [address] [place],  [place] [address] PCP: Luba Running, FNP   Assessment & Plan: Visit Diagnoses:  1. Arthritis of right shoulder region   2. Adhesive capsulitis of right shoulder     Plan: Impression is right shoulder glenohumeral osteoarthritis with possible component of adhesive capsulitis.  She does not recall the injection she underwent with Dr. Vaughn Georges.  She does tell me she had a recent MRI of her right shoulder but I am unable to see this in canopy and she does not have a disc with her today.  She will obtain the MRI and follow-up for repeat evaluation and treatment recommendations.  Follow-Up Instructions: Return for f/u after obtaining right shoulder MRI.   Orders:  No orders of the defined types were placed in this encounter.  No orders of the defined types were placed in this encounter.     Procedures: No procedures performed   Clinical Data: No additional findings.   Subjective: Chief Complaint  Patient presents with   Right Shoulder - Pain    HPI patient is a 63 year old female who comes in today with continued right shoulder pain.  She was seen by me in October of last year with right shoulder glenohumeral osteoarthritis and adhesive capsulitis.  She was referred to Dr. Vaughn Georges for cortisone injection for which she underwent about a week later.  She does not recall having had this injection or even seeing Dr. Vaughn Georges.  She tells me she continues to have pain to the entire shoulder worse with any movement of the shoulder.  She has been using diclofenac without significant relief.  She tells me today that she was seen at a pain clinic recently where she underwent another cortisone injection as well as MRI of the right shoulder.  She tells me her pain is worse following  this most recent cortisone injection.  She does not have copies of the MRI today.  Review of Systems as detailed in HPI.  All others reviewed and are negative.   Objective: Vital Signs: LMP  (LMP Unknown)   Physical Exam well-developed well-nourished female in no acute distress.  Ortho Exam right shoulder exam: She has active forward flexion to about 80 degrees.  I can passively get her to about 100 degrees.  External rotation to 30 degrees.  Internal rotation to her back pocket.  Specialty Comments:  No specialty comments available.  Imaging: No new imaging   PMFS History: Patient Active Problem List   Diagnosis Date Noted   Need for pneumococcal vaccine 10/07/2021   Screen for colon cancer 10/07/2021   Need for prophylactic vaccination with combined diphtheria-tetanus-pertussis (DTP) vaccine 10/07/2021   Need for prophylactic vaccination and inoculation against varicella 10/07/2021   Encounter for general adult medical examination with abnormal findings 10/07/2021   Diabetic nephropathy associated with type 2 diabetes mellitus (HCC) 05/31/2021   Type II diabetes mellitus with manifestations (HCC) 05/31/2021   Cervical cancer screening 05/30/2021   Visit for screening mammogram 05/30/2021   Abnormal electrocardiogram (ECG) (EKG) 05/30/2021   Chronic bilateral low back pain without sciatica 01/18/2021   Thiamine  deficiency neuropathy 04/10/2020   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  Severe episode of recurrent major depressive disorder, with psychotic features (HCC) 02/24/2020   Hyperlipidemia with target LDL less than 130 02/23/2020   Primary hypertension 02/23/2020   Cocaine  abuse (HCC) 02/23/2020   Glaucoma of both eyes 02/23/2020   Stage 3a chronic kidney disease (HCC) 02/23/2020   Primary osteoarthritis of left knee 06/10/2016   Gastroesophageal reflux disease without esophagitis 12/15/2015   Urothelial carcinoma (HCC) 05/04/2015   Severe recurrent major depressive  disorder with psychotic features (HCC) 10/13/2014   Primary insomnia 08/17/2014   Anxiety and depression 05/13/2014   Open-angle glaucoma 05/13/2014   Diabetes mellitus, type 2 (HCC) 03/03/2014   Hypothyroidism 03/03/2014   Past Medical History:  Diagnosis Date   Anxiety    Arthritis    all joints and thumbs   Bladder tumor    Cataract    Glaucoma of both eyes    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   Hyperlipidemia    OSA (obstructive sleep apnea)    per pt study 2008  no cpap   Ovarian cancer (HCC)    Post-surgical hypothyroidism    Severe recurrent major depression with psychotic features (HCC)    followed by psychologist--  dr Alba Ally   Sleep apnea    Type 2 diabetes mellitus (HCC)    Wears dentures    upper   Wears glasses     Family History  Problem Relation Age of Onset   Diabetes Mother    Stroke Mother    Heart disease Mother    Alcohol abuse Father    Colon cancer Father    Cancer Sister    Alcohol abuse Sister    Alcohol abuse Brother    Drug abuse Brother    Alcohol abuse Brother    Drug abuse Brother    Esophageal cancer Neg Hx    Colon polyps Neg Hx    Rectal cancer Neg Hx    Stomach cancer Neg Hx     Past Surgical History:  Procedure Laterality Date   COLONOSCOPY  Nov 2016   STRABISMUS SURGERY Bilateral age 78   THYROIDECTOMY  89   TRANSURETHRAL RESECTION OF BLADDER TUMOR N/A 04/28/2015   Procedure: TRANSURETHRAL RESECTION OF BLADDER TUMOR (TURBT) ;  Surgeon: Trent Frizzle, MD;  Location: [facility];  Service: Urology;  Laterality: N/A;   Social History   Occupational History   Not on file  Tobacco Use   Smoking status: Every Day    Current packs/day: 0.35    Average packs/day: 0.4 packs/day for 13.0 years (4.6 ttl pk-yrs)    Types: Cigarettes    Passive exposure: Past   Smokeless tobacco: Never  Vaping Use   Vaping status: Never Used  Substance and Sexual Activity    Alcohol use: Not Currently   Drug use: Not Currently   Sexual activity: Never    Partners: Male

## 2023-07-16 ENCOUNTER — Other Ambulatory Visit: Payer: Self-pay | Admitting: Family

## 2023-07-16 DIAGNOSIS — Z1231 Encounter for screening mammogram for malignant neoplasm of breast: Secondary | ICD-10-CM

## 2023-07-17 ENCOUNTER — Ambulatory Visit: Admitting: Physician Assistant

## 2023-07-17 DIAGNOSIS — M25511 Pain in right shoulder: Secondary | ICD-10-CM

## 2023-07-17 DIAGNOSIS — G8929 Other chronic pain: Secondary | ICD-10-CM

## 2023-07-17 NOTE — Progress Notes (Signed)
 Office Visit Note   Patient: Raven [name]           Date of Birth: 04/15/1960           MRN: [id number] Visit Date: 07/17/2023              Requested by: Luba Running, FNP [address] [place],  [place] [address] PCP: Luba Running, FNP   Assessment & Plan: Visit Diagnoses:  1. Chronic right shoulder pain     Plan: Impression is chronic right shoulder pain with underlying supraspinatus tendinosis with a small insertional tear in addition to severe glenohumeral osteoarthritis.  Cortisone injections unfortunately do not seem to be helping with pain.  We have discussed referral to either Dr. Rozelle Corning or Dr. Hermina Loosen in for surgical consultation possible reverse shoulder replacement.  She will follow-up with us  as needed.  Follow-Up Instructions: Return for f/u with Dr. Rozelle Corning or Dr. Hermina Loosen.   Orders:  No orders of the defined types were placed in this encounter.  No orders of the defined types were placed in this encounter.     Procedures: No procedures performed   Clinical Data: No additional findings.   Subjective: Chief Complaint  Patient presents with   Right Shoulder - Pain    HPI patient is a very pleasant 63 year old female who comes in today to review MRI results of her right shoulder.  She has been complaining of significant right shoulder pain for many months.  She saw me back in October of last year where she was referred to Dr. Vaughn Georges for cortisone injection for underlying glenohumeral osteoarthritis and adhesive capsulitis.  I saw her again a couple weeks ago with continued pain.  She does not recall having had the injection.  She does tell me she had been seen elsewhere where she underwent another cortisone injection as well as an MRI of the right shoulder.  She continues to have worsening pain despite these injections.  She brings in her MRI results today.  Review of Systems as detailed in HPI.  All others reviewed and are  negative.   Objective: Vital Signs: LMP  (LMP Unknown)   Physical Exam well-developed well-nourished female in no acute distress.  Alert and oriented x 3.  Ortho Exam unchanged right shoulder exam  Specialty Comments:  No specialty comments available.  Imaging: No new imaging   PMFS History: Patient Active Problem List   Diagnosis Date Noted   Need for pneumococcal vaccine 10/07/2021   Screen for colon cancer 10/07/2021   Need for prophylactic vaccination with combined diphtheria-tetanus-pertussis (DTP) vaccine 10/07/2021   Need for prophylactic vaccination and inoculation against varicella 10/07/2021   Encounter for general adult medical examination with abnormal findings 10/07/2021   Diabetic nephropathy associated with type 2 diabetes mellitus (HCC) 05/31/2021   Type II diabetes mellitus with manifestations (HCC) 05/31/2021   Cervical cancer screening 05/30/2021   Visit for screening mammogram 05/30/2021   Abnormal electrocardiogram (ECG) (EKG) 05/30/2021   Chronic bilateral low back pain without sciatica 01/18/2021   Thiamine  deficiency neuropathy 04/10/2020   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   Severe episode of recurrent major depressive disorder, with psychotic features (HCC) 02/24/2020   Hyperlipidemia with target LDL less than 130 02/23/2020   Primary hypertension 02/23/2020   Cocaine  abuse (HCC) 02/23/2020   Glaucoma of both eyes 02/23/2020   Stage 3a chronic kidney disease (HCC) 02/23/2020   Primary osteoarthritis of left knee 06/10/2016   Gastroesophageal reflux disease without esophagitis  12/15/2015   Urothelial carcinoma (HCC) 05/04/2015   Severe recurrent major depressive disorder with psychotic features (HCC) 10/13/2014   Primary insomnia 08/17/2014   Anxiety and depression 05/13/2014   Open-angle glaucoma 05/13/2014   Diabetes mellitus, type 2 (HCC) 03/03/2014   Hypothyroidism 03/03/2014   Past Medical History:  Diagnosis Date   Anxiety    Arthritis     all joints and thumbs   Bladder tumor    Cataract    Glaucoma of both eyes    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   Hyperlipidemia    OSA (obstructive sleep apnea)    per pt study 2008  no cpap   Ovarian cancer (HCC)    Post-surgical hypothyroidism    Severe recurrent major depression with psychotic features (HCC)    followed by psychologist--  dr Alba Ally   Sleep apnea    Type 2 diabetes mellitus (HCC)    Wears dentures    upper   Wears glasses     Family History  Problem Relation Age of Onset   Diabetes Mother    Stroke Mother    Heart disease Mother    Alcohol abuse Father    Colon cancer Father    Cancer Sister    Alcohol abuse Sister    Alcohol abuse Brother    Drug abuse Brother    Alcohol abuse Brother    Drug abuse Brother    Esophageal cancer Neg Hx    Colon polyps Neg Hx    Rectal cancer Neg Hx    Stomach cancer Neg Hx     Past Surgical History:  Procedure Laterality Date   COLONOSCOPY  Nov 2016   STRABISMUS SURGERY Bilateral age 68   THYROIDECTOMY  45   TRANSURETHRAL RESECTION OF BLADDER TUMOR N/A 04/28/2015   Procedure: TRANSURETHRAL RESECTION OF BLADDER TUMOR (TURBT) ;  Surgeon: Trent Frizzle, MD;  Location: [facility];  Service: Urology;  Laterality: N/A;   Social History   Occupational History   Not on file  Tobacco Use   Smoking status: Every Day    Current packs/day: 0.35    Average packs/day: 0.4 packs/day for 13.0 years (4.6 ttl pk-yrs)    Types: Cigarettes    Passive exposure: Past   Smokeless tobacco: Never  Vaping Use   Vaping status: Never Used  Substance and Sexual Activity   Alcohol use: Not Currently   Drug use: Not Currently   Sexual activity: Never    Partners: Male

## 2023-07-30 ENCOUNTER — Ambulatory Visit (INDEPENDENT_AMBULATORY_CARE_PROVIDER_SITE_OTHER): Admitting: Orthopedic Surgery

## 2023-07-30 DIAGNOSIS — M25511 Pain in right shoulder: Secondary | ICD-10-CM

## 2023-07-30 DIAGNOSIS — G8929 Other chronic pain: Secondary | ICD-10-CM | POA: Diagnosis not present

## 2023-07-30 DIAGNOSIS — M19011 Primary osteoarthritis, right shoulder: Secondary | ICD-10-CM | POA: Diagnosis not present

## 2023-07-30 MED ORDER — TRAMADOL HCL 50 MG PO TABS: 50.0000 mg | ORAL_TABLET | Freq: Three times a day (TID) | ORAL | 0 refills | Status: DC | PRN

## 2023-07-30 MED ORDER — METHOCARBAMOL 500 MG PO TABS: 500.0000 mg | ORAL_TABLET | Freq: Three times a day (TID) | ORAL | 0 refills | Status: DC | PRN

## 2023-08-03 ENCOUNTER — Encounter: Payer: Self-pay | Admitting: Orthopedic Surgery

## 2023-08-03 NOTE — Progress Notes (Signed)
 Office Visit Note   Patient: Raven [name]           Date of Birth: June 14, 1960           MRN: [id number] Visit Date: 07/30/2023 Requested by: Luba Running, FNP [address] [place],  [place] [address] PCP: Luba Running, FNP  Subjective: Chief Complaint  Patient presents with   Right Shoulder - Pain    HPI: Raven [name] is a 63 y.o. female who presents to the office reporting right shoulder pain.  She describes chronic pain from a fall as well as decreased range of motion.  Pain is constant and severe.  She cannot sleep and cannot get comfortable.  She is left-hand dominant.  She states "I am ready to get this arm fixed".  She is in pain management at [facility].  She has tried ice heat and pillows which really do not help.  She has had over 6 months of symptoms.  She has a home health aide for 3 hours a day 7 days a week.  Hemoglobin A1c 7.5.  No personal or family history of DVT or pulmonary embolism.  She had a prior injection which made her symptoms worse.  This was done in 2024.  She is retired.  MRI of the right shoulder demonstrates severe arthritis of the right glenohumeral joint with some interstitial tearing of the rotator cuff and tendinosis of the subscapularis..                ROS: All systems reviewed are negative as they relate to the chief complaint within the history of present illness.  Patient denies fevers or chills.  Assessment & Plan: Visit Diagnoses:  1. Chronic right shoulder pain   2. Arthritis of right shoulder region     Plan: Impression is end-stage arthritis in the right shoulder.  She does have some tendinitis.  Has some restricted range of motion but it is difficult to assess because of her pain.  She is retired and does not put too much in terms of overhead demands on her shoulder.  Plan at this time is to refill Robaxin  and tramadol .  Then cut CT scan preop shoulder replacement on that right-hand side.  The risk and benefits of shoulder  replacement are discussed with the patient including not limited to infection or vessel damage incomplete pain relief as well as incomplete restoration of function.  Patient understands and wishes to proceed.  She has a tree of drug abuse which is in remission so we will have to be careful with narcotic medicine after surgery.  All questions answered.  She will come back for repeat clinical recheck prior to her surgery after her CT scan.  Anticipate likely reverse replacement just for the predictability of no further surgery in this patient who does not have a lot of overhead demand and strength related demands.  She also has some rotator cuff abnormalities which I would not want to progress over the next several years requiring revision surgery.  Follow-Up Instructions: No follow-ups on file.   Orders:  Orders Placed This Encounter  Procedures   CT SHOULDER RIGHT WO CONTRAST   Meds ordered this encounter  Medications   methocarbamol  (ROBAXIN ) 500 MG tablet    Sig: Take 1 tablet (500 mg total) by mouth every 8 (eight) hours as needed for muscle spasms.    Dispense:  30 tablet    Refill:  0   traMADol  (ULTRAM ) 50  MG tablet    Sig: Take 1 tablet (50 mg total) by mouth every 8 (eight) hours as needed.    Dispense:  20 tablet    Refill:  0      Procedures: No procedures performed   Clinical Data: No additional findings.  Objective: Vital Signs: LMP  (LMP Unknown)   Physical Exam:  Constitutional: Patient appears well-developed HEENT:  Head: Normocephalic Eyes:EOM are normal Neck: Normal range of motion Cardiovascular: Normal rate Pulmonary/chest: Effort normal Neurologic: Patient is alert Skin: Skin is warm Psychiatric: Patient has normal mood and affect  Ortho Exam: Ortho exam demonstrates functional deltoid on the right.  She does have pain with painful range of motion.  Subscap strength 5+ out of 5 external rotation strength is 5- out of 5.  Little bit of coarseness with  passive range of motion of the right consistent with her known diagnosis of arthritis.  Does have limitation of forward flexion abduction actively and passively.  No discrete AC joint tenderness right versus left.  Cervical spine range of motion intact.  Specialty Comments:  No specialty comments available.  Imaging: No results found.   PMFS History: Patient Active Problem List   Diagnosis Date Noted   Need for pneumococcal vaccine 10/07/2021   Screen for colon cancer 10/07/2021   Need for prophylactic vaccination with combined diphtheria-tetanus-pertussis (DTP) vaccine 10/07/2021   Need for prophylactic vaccination and inoculation against varicella 10/07/2021   Encounter for general adult medical examination with abnormal findings 10/07/2021   Diabetic nephropathy associated with type 2 diabetes mellitus (HCC) 05/31/2021   Type II diabetes mellitus with manifestations (HCC) 05/31/2021   Cervical cancer screening 05/30/2021   Visit for screening mammogram 05/30/2021   Abnormal electrocardiogram (ECG) (EKG) 05/30/2021   Chronic bilateral low back pain without sciatica 01/18/2021   Thiamine  deficiency neuropathy 04/10/2020   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   Severe episode of recurrent major depressive disorder, with psychotic features (HCC) 02/24/2020   Hyperlipidemia with target LDL less than 130 02/23/2020   Primary hypertension 02/23/2020   Cocaine  abuse (HCC) 02/23/2020   Glaucoma of both eyes 02/23/2020   Stage 3a chronic kidney disease (HCC) 02/23/2020   Primary osteoarthritis of left knee 06/10/2016   Gastroesophageal reflux disease without esophagitis 12/15/2015   Urothelial carcinoma (HCC) 05/04/2015   Severe recurrent major depressive disorder with psychotic features (HCC) 10/13/2014   Primary insomnia 08/17/2014   Anxiety and depression 05/13/2014   Open-angle glaucoma 05/13/2014   Diabetes mellitus, type 2 (HCC) 03/03/2014   Hypothyroidism 03/03/2014   Past Medical  History:  Diagnosis Date   Anxiety    Arthritis    all joints and thumbs   Bladder tumor    Cataract    Glaucoma of both eyes    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   Hyperlipidemia    OSA (obstructive sleep apnea)    per pt study 2008  no cpap   Ovarian cancer (HCC)    Post-surgical hypothyroidism    Severe recurrent major depression with psychotic features (HCC)    followed by psychologist--  dr Alba Ally   Sleep apnea    Type 2 diabetes mellitus (HCC)    Wears dentures    upper   Wears glasses     Family History  Problem Relation Age of Onset   Diabetes Mother    Stroke Mother    Heart disease Mother    Alcohol abuse  Father    Colon cancer Father    Cancer Sister    Alcohol abuse Sister    Alcohol abuse Brother    Drug abuse Brother    Alcohol abuse Brother    Drug abuse Brother    Esophageal cancer Neg Hx    Colon polyps Neg Hx    Rectal cancer Neg Hx    Stomach cancer Neg Hx     Past Surgical History:  Procedure Laterality Date   COLONOSCOPY  Nov 2016   STRABISMUS SURGERY Bilateral age 63   THYROIDECTOMY  15   TRANSURETHRAL RESECTION OF BLADDER TUMOR N/A 04/28/2015   Procedure: TRANSURETHRAL RESECTION OF BLADDER TUMOR (TURBT) ;  Surgeon: Trent Frizzle, MD;  Location: [facility];  Service: Urology;  Laterality: N/A;   Social History   Occupational History   Not on file  Tobacco Use   Smoking status: Every Day    Current packs/day: 0.35    Average packs/day: 0.4 packs/day for 13.0 years (4.6 ttl pk-yrs)    Types: Cigarettes    Passive exposure: Past   Smokeless tobacco: Never  Vaping Use   Vaping status: Never Used  Substance and Sexual Activity   Alcohol use: Not Currently   Drug use: Not Currently   Sexual activity: Never    Partners: Male

## 2023-08-08 ENCOUNTER — Ambulatory Visit
Admission: RE | Admit: 2023-08-08 | Discharge: 2023-08-08 | Disposition: A | Discharge status: Home / Self Care | Source: Ambulatory Visit | Attending: Orthopedic Surgery | Admitting: Orthopedic Surgery

## 2023-08-08 ENCOUNTER — Encounter: Payer: Self-pay | Admitting: Orthopedic Surgery

## 2023-08-08 DIAGNOSIS — M19011 Primary osteoarthritis, right shoulder: Secondary | ICD-10-CM

## 2023-08-08 DIAGNOSIS — G8929 Other chronic pain: Secondary | ICD-10-CM

## 2023-08-08 NOTE — Progress Notes (Signed)
 I left voicemail for patient to call and schedule appt with [facility] post CT for review.

## 2023-08-14 NOTE — Progress Notes (Signed)
 New Patient Evaluation and Consultation  Referring Provider: Luba Running, FNP PCP: Luba Running, FNP Date of Service: 08/15/2023  SUBJECTIVE Chief Complaint: New Patient (Initial Visit) Yicel Shannon Rimmer is a 63 y.o. female here today urinary incontinence)  History of Present Illness: EZZIE SENAT is a 63 y.o. Black or African-American female seen in consultation at the request of NP Dorothea Gata for evaluation of urinary incontinence.    Reports urinary leakage started in 2024 when she was drinking alcohol with 50lb weight gain. Goes to the gym and exercises to try to lose weight. Cut out chocolate and chips, red meat. Doing small portions. Stopped drinking for 9 months, prior use 12 pack of beer/day with 1/2 gallon of Cognac lasts around 2 weeks  Denies new medications, surgery, falls S/p TURBT of 2 cm right posterior/lateral bladder wall tumor by Dr. Joie Narrow in 04/28/15. Pathology:  Bladder, transurethral resection, tumor - LOW GRADE PAPILLARY UROTHELIAL CARCINOMA. - NO INVASION IDENTIFIED. - MUSCULARIS PROPRIA IS PRESENT AN UNINVOLVED BY TUMOR. Prior follow-up with Dr. Sherrine Dolly due to hematuria and follow-up yearly, last seen in 01/2023 with cystoscopy 12/02/22 Denies medication, surgical intervention for urinary leakage. Reports intermittent blood noted with wipe postvoid. Tobacco use 1 pack/week  Uses oxycodone  for back pain  Review of records significant for: History of urothelial cancer, history of cocaine  use, open angle glaucoma, stage 3a CKD, thiamine  deficiency neuropathy, T2DM with HbA1C 7.5 on 06/04/23 and neuropathy, pr reports history of ovarian cancer in 2017 (possibly referring to bladder tumor, denies abdominal incision or oophorectomy/chemo/radiation), thyroid  cancer in 2006  CT abd/pelvis 09/01/22 CLINICAL DATA:  Abdomen and flank pain history of bladder tumor resection   EXAM: CT ABDOMEN AND PELVIS WITH CONTRAST   TECHNIQUE: Multidetector CT imaging of the  abdomen and pelvis was performed using the standard protocol following bolus administration of intravenous contrast.   RADIATION DOSE REDUCTION: This exam was performed according to the departmental dose-optimization program which includes automated exposure control, adjustment of the mA and/or kV according to patient size and/or use of iterative reconstruction technique.   CONTRAST:  80mL OMNIPAQUE  IOHEXOL  300 MG/ML  SOLN   COMPARISON:  CT 09/21/2018   FINDINGS: Lower chest: Lung bases demonstrate no acute airspace disease.   Hepatobiliary: No focal liver abnormality is seen. No gallstones, gallbladder wall thickening, or biliary dilatation.   Pancreas: Unremarkable. No pancreatic ductal dilatation or surrounding inflammatory changes.   Spleen: Normal in size without focal abnormality.   Adrenals/Urinary Tract: Adrenal glands are unremarkable. Kidneys are normal, without renal calculi, focal lesion, or hydronephrosis. Bladder is unremarkable.   Stomach/Bowel: Stomach is within normal limits. Appendix appears normal. No evidence of bowel wall thickening, distention, or inflammatory changes.   Vascular/Lymphatic: Moderate aortic atherosclerosis. No aneurysm. No suspicious lymph nodes. Circumaortic left renal vein.   Reproductive: Uterus and bilateral adnexa are unremarkable.   Other: Negative for pelvic effusion or free air.   Musculoskeletal: Advanced degenerative change of the left hip   IMPRESSION: 1. No CT evidence for acute intra-abdominal or pelvic abnormality. 2. Advanced degenerative change of the left hip. 3. Aortic atherosclerosis.   Aortic Atherosclerosis (ICD10-I70.0).     Electronically Signed   By: Esmeralda Hedge M.D.   On: 09/01/2022 19:23  Urinary Symptoms: Leaks urine with cough/ sneeze, laughing, exercise, with urgency, and while asleep Leaks 2 time(s) per week with urgency, more at night. More bothersome Leaks 3-4x/week with coughing/laughing Pad use: 2  adult diapers per day, mostly at night Patient  is bothered by UI symptoms.  Day time voids 4-6.  Nocturia: 3-4 times per night to void with insomnia and history of OSA and denies CPAP use. Worsens if she eats after 7pm Denies leg swelling Voiding dysfunction:  does not empty bladder well.  Patient does not use a catheter to empty bladder.  When urinating, patient feels a weak stream and the need to urinate multiple times in a row Drinks: 5-6 bottles of water per day, 16oz apple/concord grape juice, soda/tea 2x/week, 12oz coffee  UTIs: 1 UTI's in the last year.   Denies history of blood in urine, kidney or bladder stones, pyelonephritis, and kidney cancer History of bladder cancer managed by Dr. Sherrine Dolly No results found for the last 90 days.   Pelvic Organ Prolapse Symptoms:                  Patient Denies a feeling of a bulge the vaginal area.   Bowel Symptom: Bowel movements: 2 time(s) per day with Type II or IV stool Stool consistency: hard or soft  Straining: yes when she eats cheese Splinting: no.  Incomplete evacuation: no.  Patient Denies accidental bowel leakage / fecal incontinence Bowel regimen: none, laxative use 1x/month Last colonoscopy: Results diverticula  HM Colonoscopy          Upcoming     Colonoscopy (Every 5 Years) Next due on 05/05/2028    05/06/2023  COLONOSCOPY   Only the first 1 history entries have been loaded, but more history exists.                Sexual Function Sexually active: no.  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   Hyperlipidemia    OSA (obstructive sleep apnea)    per pt study 2008  no cpap   Ovarian cancer (HCC)    Post-surgical  hypothyroidism    Severe recurrent major depression with psychotic features [facility])    followed by psychologist--  dr Alba Ally   Sleep apnea    Type 2 diabetes mellitus (HCC)    Wears dentures    upper   Wears glasses      Past Surgical History:   Past Surgical History:  Procedure Laterality Date   COLONOSCOPY  Nov 2016   STRABISMUS SURGERY Bilateral age 11   THYROIDECTOMY  64   TRANSURETHRAL RESECTION OF BLADDER TUMOR N/A 04/28/2015   Procedure: TRANSURETHRAL RESECTION OF BLADDER TUMOR (TURBT) ;  Surgeon: Trent Frizzle, MD;  Location: [facility];  Service: Urology;  Laterality: N/A;     Past OB/GYN History: OB History  Gravida Para Term Preterm AB Living  6 4 4  2 4   SAB IAB Ectopic Multiple Live Births  2    4    # Outcome Date GA Lbr Len/2nd Weight Sex Type Anes PTL Lv  6 Term     M Vag-Spont   LIV  5 Term     M Vag-Spont   LIV  4 Term     M Vag-Spont   LIV  3 Term     M Vag-Spont  LIV  2 SAB           1 SAB             Vaginal deliveries: 4, largest infant 8lb12oz  Forceps/ Vacuum deliveries: 0, Cesarean section: 0 Menopausal: Yes, at age 89, Denies vaginal bleeding since menopause Contraception: s/p menopause. Last pap smear was 02/2023 per pt, negative 05/05/19.  Any history of abnormal pap smears: no. No results found for: DIAGPAP, HPVHIGH, ADEQPAP  Medications: Patient has a current medication list which includes the following prescription(s): gemtesa, gemtesa, aleve , amlodipine , aspirin  ec, atorvastatin , bimatoprost , bupropion , cyclobenzaprine , diclofenac sodium, latanoprost, levothyroxine , lidocaine , methocarbamol , methocarbamol , prednisone , sertraline, thiamine , tramadol , tramadol , and trazodone , and the following Facility-Administered Medications: bisacodyl .   Allergies: Patient has no known allergies.   Social History:  Social History   Tobacco Use   Smoking status: Every Day    Current packs/day: 0.35    Average  packs/day: 0.4 packs/day for 13.0 years (4.6 ttl pk-yrs)    Types: Cigarettes    Passive exposure: Past   Smokeless tobacco: Never  Vaping Use   Vaping status: Never Used  Substance Use Topics   Alcohol use: Not Currently    Comment: former   Drug use: Not Currently    Comment: Former    Relationship status: single Patient lives alone.   Patient is not employed . Regular exercise: Yes: gym History of abuse: No  Family History:   Family History  Problem Relation Age of Onset   Diabetes Mother    Stroke Mother    Heart disease Mother    Alcohol abuse Father    Colon cancer Father    Cancer Sister    Alcohol abuse Sister    Alcohol abuse Brother    Drug abuse Brother    Alcohol abuse Brother    Drug abuse Brother    Esophageal cancer Neg Hx    Colon polyps Neg Hx    Rectal cancer Neg Hx    Stomach cancer Neg Hx    Uterine cancer Neg Hx    Bladder Cancer Neg Hx    Renal cancer Neg Hx      Review of Systems: Review of Systems  Constitutional:  Negative for fever, malaise/fatigue and weight loss.  Respiratory:  Negative for cough, shortness of breath and wheezing.   Cardiovascular:  Negative for chest pain, palpitations and leg swelling.  Gastrointestinal:  Negative for abdominal pain and blood in stool.  Genitourinary:  Positive for frequency and urgency. Negative for hematuria.  Skin:  Negative for rash.  Neurological:  Positive for weakness. Negative for dizziness and headaches.  Endo/Heme/Allergies:  Does not bruise/bleed easily.       Hot flashes  Psychiatric/Behavioral:  Positive for depression. The patient is nervous/anxious.      OBJECTIVE Physical Exam: Vitals:   08/15/23 1302  BP: 125/75  Pulse: 78  Weight: 215 lb (97.5 kg)  Height: 5' 8 (1.727 m)    Physical Exam Constitutional:      General: She is not in acute distress.    Appearance: Normal appearance.  Genitourinary:     Bladder and urethral meatus normal.     No lesions in the  vagina.     Right Labia: No rash, tenderness, lesions, skin changes or Bartholin's cyst.    Left Labia: No tenderness, lesions, skin changes, Bartholin's cyst or rash.    No vaginal discharge, erythema, tenderness, bleeding, ulceration or granulation tissue.     Anterior vaginal prolapse present.  Right Adnexa: not tender, not full and no mass present.    Left Adnexa: not tender, not full and no mass present.    No cervical motion tenderness, discharge, friability, lesion, polyp or nabothian cyst.     Uterus is not enlarged, fixed, tender or irregular.     No uterine mass detected.    Urethral meatus caruncle not present.    Urethral stress urinary incontinence with cough stress test present.     No urethral prolapse, tenderness, mass, hypermobility or discharge present.     Bladder is not tender, urgency on palpation not present and masses not present.      Levator ani not tender, obturator internus not tender, no asymmetrical contractions present and no pelvic spasms present.    Anal wink present and BC reflex present.  Cardiovascular:     Rate and Rhythm: Normal rate.  Pulmonary:     Effort: Pulmonary effort is normal. No respiratory distress.  Abdominal:     General: There is no distension.     Palpations: Abdomen is soft. There is no mass.     Tenderness: There is no abdominal tenderness.     Hernia: No hernia is present.   Neurological:     Mental Status: She is alert.  Vitals reviewed. Exam conducted with a chaperone present.      POP-Q:   POP-Q  -2                                            Aa   -2                                           Ba  -6                                              C   3                                            Gh  4                                            Pb  9                                            tvl   -3                                            Ap  -3  Bp  -8                                               D     Post-Void Residual (PVR) by Bladder Scan: In order to evaluate bladder emptying, we discussed obtaining a postvoid residual and patient agreed to this procedure.  Procedure: The ultrasound unit was placed on the patient's abdomen in the suprapubic region after the patient had voided.    Post Void Residual - 08/15/23 1636       Post Void Residual   Post Void Residual 17 mL           Laboratory Results: Lab Results  Component Value Date   COLORU yellow 08/15/2023   CLARITYU clear 08/15/2023   GLUCOSEUR negative 08/15/2023   BILIRUBINUR small (A) 08/15/2023   KETONESU neg 02/23/2020   SPECGRAV 1.025 08/15/2023   RBCUR negative 08/15/2023   PHUR 5.5 08/15/2023   PROTEINUR NEGATIVE 09/01/2022   UROBILINOGEN 1.0 08/15/2023   LEUKOCYTESUR Negative 08/15/2023    Lab Results  Component Value Date   CREATININE 1.47 (H) 09/01/2022   CREATININE 1.17 10/03/2021   CREATININE 1.42 (H) 08/30/2021    Lab Results  Component Value Date   HGBA1C 6.4 08/30/2021    Lab Results  Component Value Date   HGB 12.1 09/01/2022     ASSESSMENT AND PLAN Ms. Myre is a 63 y.o. with:  1. Urinary incontinence, mixed   2. Urothelial carcinoma (HCC)   3. BMI 32.0-32.9,adult   4. Nocturia   5. Constipation, unspecified constipation type   6. Tobacco use     Urinary incontinence, mixed Assessment & Plan: - urgency > stress - POCT UA + bilirubin, PVR 17mL - We discussed the symptoms of overactive bladder (OAB), which include urinary urgency, urinary frequency, nocturia, with or without urge incontinence.  While we do not know the exact etiology of OAB, several treatment options exist. We discussed management including behavioral therapy (decreasing bladder irritants, urge suppression strategies, timed voids, bladder retraining), physical therapy, medication; for refractory cases posterior tibial nerve stimulation, sacral neuromodulation, and  intravesical botulinum toxin injection.  For anticholinergic medications, we discussed the potential side effects of anticholinergics including dry eyes, dry mouth, constipation, cognitive impairment and urinary retention. For Beta-3 agonist medication, we discussed the potential side effect of elevated blood pressure which is more likely to occur in individuals with uncontrolled hypertension. - samples and Rx provided for Gemtesa - encouraged fluid management, Caffeine reduction, and Kegel exercises - encouraged weight reduction and optimization of stool consistency - For treatment of stress urinary incontinence,  non-surgical options include expectant management, weight loss, physical therapy, as well as a pessary.  Surgical options include a midurethral sling, Burch urethropexy, and transurethral injection of a bulking agent.  Orders: Seferino Dade; Take 1 tablet (75 mg total) by mouth daily. Seferino Dade; Take 1 tablet (75 mg total) by mouth daily.  Dispense: 30 tablet; Refill: 2 -     POCT URINALYSIS DIP (CLINITEK)  Urothelial carcinoma (HCC) Assessment & Plan: - S/p TURBT of 2 cm right posterior/lateral bladder wall tumor by Dr. Joie Narrow in 04/28/15 consistent with low grade papillary urothelial carcinoma - continues to follow-up with Dr. Sherrine Dolly - discussed importance of tobacco cessation - encouraged to return for evaluation if she experiences change in  urinary symptoms or experiences blood in urine.   BMI 32.0-32.9,adult Assessment & Plan: - encouraged weight reduction due to association with pelvic floor disorders - encouraged to continue diet modification and exercises - consider referral to healthy weight clinic   Nocturia Assessment & Plan: - avoid fluid intake 3 hours before bedtime - due to snoring and history of sleep apnea, consider referral to sleep medicine for CPAP fitting - trial of Gemtesa   Constipation, unspecified constipation type Assessment & Plan: -  For constipation, we reviewed the importance of a better bowel regimen.  We also discussed the importance of avoiding chronic straining, as it can exacerbate her pelvic floor symptoms; we discussed treating constipation and straining prior to surgery, as postoperative straining can lead to damage to the repair and recurrence of symptoms. We discussed initiating therapy with increasing fluid intake, fiber supplementation, stool softeners, and laxatives such as miralax.  - encouraged titration of fiber supplementation to reduce PRN laxative use   Tobacco use Assessment & Plan: - encouraged tobacco cessation due to history of bladder tumor - discussed increased risk of malignancy    Time spent: I spent 70 minutes dedicated to the care of this patient on the date of this encounter to include pre-visit review of records, face-to-face time with the patient discussing mixed urinary incontinence, history of low grade urothelial carcinoma, tobacco use, nocturia, constipation, and post visit documentation and ordering medication/ testing.   Darlene Ehlers, MD

## 2023-08-15 ENCOUNTER — Ambulatory Visit: Payer: Medicare HMO | Admitting: Obstetrics and Gynecology

## 2023-08-15 ENCOUNTER — Ambulatory Visit (INDEPENDENT_AMBULATORY_CARE_PROVIDER_SITE_OTHER): Payer: Medicare HMO | Admitting: Obstetrics

## 2023-08-15 ENCOUNTER — Encounter: Payer: Self-pay | Admitting: Obstetrics

## 2023-08-15 VITALS — BP 125/75 | HR 78 | Ht 68.0 in | Wt 215.0 lb

## 2023-08-15 DIAGNOSIS — K5900 Constipation, unspecified: Secondary | ICD-10-CM | POA: Insufficient documentation

## 2023-08-15 DIAGNOSIS — C689 Malignant neoplasm of urinary organ, unspecified: Secondary | ICD-10-CM

## 2023-08-15 DIAGNOSIS — R351 Nocturia: Secondary | ICD-10-CM | POA: Insufficient documentation

## 2023-08-15 DIAGNOSIS — Z720 Tobacco use: Secondary | ICD-10-CM | POA: Insufficient documentation

## 2023-08-15 DIAGNOSIS — N3946 Mixed incontinence: Secondary | ICD-10-CM | POA: Diagnosis not present

## 2023-08-15 DIAGNOSIS — Z6832 Body mass index (BMI) 32.0-32.9, adult: Secondary | ICD-10-CM | POA: Insufficient documentation

## 2023-08-15 LAB — POCT URINALYSIS DIP (CLINITEK)
Blood, UA: NEGATIVE
Glucose, UA: NEGATIVE mg/dL
Leukocytes, UA: NEGATIVE
Nitrite, UA: NEGATIVE
POC PROTEIN,UA: NEGATIVE
Spec Grav, UA: 1.025 (ref 1.010–1.025)
Urobilinogen, UA: 1 U/dL
pH, UA: 5.5 (ref 5.0–8.0)

## 2023-08-15 MED ORDER — GEMTESA 75 MG PO TABS: 75.0000 mg | ORAL_TABLET | Freq: Every day | ORAL | Status: DC

## 2023-08-15 MED ORDER — GEMTESA 75 MG PO TABS: 75.0000 mg | ORAL_TABLET | Freq: Every day | ORAL | 2 refills | Status: DC

## 2023-08-15 NOTE — Assessment & Plan Note (Signed)
-   avoid fluid intake 3 hours before bedtime - due to snoring and history of sleep apnea, consider referral to sleep medicine for CPAP fitting - trial of Gemtesa

## 2023-08-15 NOTE — Patient Instructions (Addendum)
 For treatment of stress urinary incontinence, which is leakage with physical activity/movement/strainging/coughing, we discussed expectant management versus nonsurgical options versus surgery. Nonsurgical options include weight loss, physical therapy, as well as a pessary.  Surgical options include a midurethral sling, which is a synthetic mesh sling that acts like a hammock under the urethra to prevent leakage of urine, a Burch urethropexy, and transurethral injection of a bulking agent.   We discussed the symptoms of overactive bladder (OAB), which include urinary urgency, urinary frequency, night-time urination, with or without urge incontinence.  We discussed management including behavioral therapy (decreasing bladder irritants by following a bladder diet, urge suppression strategies, timed voids, bladder retraining), physical therapy, medication; and for refractory cases posterior tibial nerve stimulation, sacral neuromodulation, and intravesical botulinum toxin injection.   For Beta-3 agonist medication, we discussed the potential side effect of elevated blood pressure which is more likely to occur in individuals with uncontrolled hypertension. You were given samples for Gemtesa 75 mg.  It can take a month to start working so give it time, but if you have bothersome side effects call sooner and we can try a different medication.  Call us  if you have trouble filling the prescription or if it's not covered by your insurance.  For night time frequency: - avoid fluid intake 3 hours before bedtime - due to snoring, encouraged CPAP use for sleep apnea  Continue exercise and dietary changes to lose weight, we discussed around 5% of weight reduction has been shown to reduce urinary leakage symptoms.   Constipation: Our goal is to achieve formed bowel movements daily or every-other-day.  You may need to try different combinations of the following options to find what works best for you - everybody's body  works differently so feel free to adjust the dosages as needed.  Some options to help maintain bowel health include:  Dietary changes (more leafy greens, vegetables and fruits; less processed foods) Fiber supplementation (Benefiber, FiberCon, Metamucil or Psyllium). Start slow and increase gradually to full dose. Over-the-counter agents such as: stool softeners (Docusate or Colace) and/or laxatives (Miralax, milk of magnesia)  Power Pudding is a natural mixture that may help your constipation.  To make blend 1 cup applesauce, 1 cup wheat bran, and 3/4 cup prune juice, refrigerate and then take 1 tablespoon daily with a large glass of water as needed.   Women should try to eat at least 21 to 25 grams of fiber a day, while men should aim for 30 to 38 grams a day. You can add fiber to your diet with food or a fiber supplement such as psyllium (metamucil), benefiber, or fibercon.   Here's a look at how much dietary fiber is found in some common foods. When buying packaged foods, check the Nutrition Facts label for fiber content. It can vary among brands.  Fruits Serving size Total fiber (grams)*  Raspberries 1 cup 8.0  Pear 1 medium 5.5  Apple, with skin 1 medium 4.5  Banana 1 medium 3.0  Orange 1 medium 3.0  Strawberries 1 cup 3.0   Vegetables Serving size Total fiber (grams)*  Green peas, boiled 1 cup 9.0  Broccoli, boiled 1 cup chopped 5.0  Turnip greens, boiled 1 cup 5.0  Brussels sprouts, boiled 1 cup 4.0  Potato, with skin, baked 1 medium 4.0  Sweet corn, boiled 1 cup 3.5  Cauliflower, raw 1 cup chopped 2.0  Carrot, raw 1 medium 1.5   Grains Serving size Total fiber (grams)*  Spaghetti, whole-wheat, cooked 1  cup 6.0  Barley, pearled, cooked 1 cup 6.0  Bran flakes 3/4 cup 5.5  Quinoa, cooked 1 cup 5.0  Oat bran muffin 1 medium 5.0  Oatmeal, instant, cooked 1 cup 5.0  Popcorn, air-popped 3 cups 3.5  Brown rice, cooked 1 cup 3.5  Bread, whole-wheat 1 slice 2.0  Bread, rye 1  slice 2.0   Legumes, nuts and seeds Serving size Total fiber (grams)*  Split peas, boiled 1 cup 16.0  Lentils, boiled 1 cup 15.5  Black beans, boiled 1 cup 15.0  Baked beans, canned 1 cup 10.0  Chia seeds 1 ounce 10.0  Almonds 1 ounce (23 nuts) 3.5  Pistachios 1 ounce (49 nuts) 3.0  Sunflower kernels 1 ounce 3.0  *Rounded to nearest 0.5 gram. Source: [employer] for [employer]	[place]

## 2023-08-15 NOTE — Assessment & Plan Note (Signed)
-   encouraged weight reduction due to association with pelvic floor disorders - encouraged to continue diet modification and exercises - consider referral to healthy weight clinic

## 2023-08-15 NOTE — Assessment & Plan Note (Signed)
-   encouraged tobacco cessation due to history of bladder tumor - discussed increased risk of malignancy

## 2023-08-15 NOTE — Assessment & Plan Note (Addendum)
-   S/p TURBT of 2 cm right posterior/lateral bladder wall tumor by Dr. Joie Narrow in 04/28/15 consistent with low grade papillary urothelial carcinoma - continues to follow-up with Dr. Sherrine Dolly - discussed importance of tobacco cessation - encouraged to return for evaluation if she experiences change in urinary symptoms or experiences blood in urine.

## 2023-08-15 NOTE — Assessment & Plan Note (Signed)
-   For constipation, we reviewed the importance of a better bowel regimen.  We also discussed the importance of avoiding chronic straining, as it can exacerbate her pelvic floor symptoms; we discussed treating constipation and straining prior to surgery, as postoperative straining can lead to damage to the repair and recurrence of symptoms. We discussed initiating therapy with increasing fluid intake, fiber supplementation, stool softeners, and laxatives such as miralax.  - encouraged titration of fiber supplementation to reduce PRN laxative use

## 2023-08-15 NOTE — Assessment & Plan Note (Addendum)
-   urgency > stress - POCT UA + bilirubin, PVR 17mL - We discussed the symptoms of overactive bladder (OAB), which include urinary urgency, urinary frequency, nocturia, with or without urge incontinence.  While we do not know the exact etiology of OAB, several treatment options exist. We discussed management including behavioral therapy (decreasing bladder irritants, urge suppression strategies, timed voids, bladder retraining), physical therapy, medication; for refractory cases posterior tibial nerve stimulation, sacral neuromodulation, and intravesical botulinum toxin injection.  For anticholinergic medications, we discussed the potential side effects of anticholinergics including dry eyes, dry mouth, constipation, cognitive impairment and urinary retention. For Beta-3 agonist medication, we discussed the potential side effect of elevated blood pressure which is more likely to occur in individuals with uncontrolled hypertension. - samples and Rx provided for Gemtesa - encouraged fluid management, Caffeine reduction, and Kegel exercises - encouraged weight reduction and optimization of stool consistency - For treatment of stress urinary incontinence,  non-surgical options include expectant management, weight loss, physical therapy, as well as a pessary.  Surgical options include a midurethral sling, Burch urethropexy, and transurethral injection of a bulking agent.

## 2023-08-25 ENCOUNTER — Ambulatory Visit
Admission: RE | Admit: 2023-08-25 | Discharge: 2023-08-25 | Disposition: A | Discharge status: Home / Self Care | Source: Ambulatory Visit | Attending: Family | Admitting: Family

## 2023-08-25 ENCOUNTER — Ambulatory Visit: Admitting: Orthopedic Surgery

## 2023-08-25 DIAGNOSIS — Z1231 Encounter for screening mammogram for malignant neoplasm of breast: Secondary | ICD-10-CM

## 2023-08-26 ENCOUNTER — Telehealth: Payer: Self-pay | Admitting: Orthopedic Surgery

## 2023-08-26 ENCOUNTER — Other Ambulatory Visit: Payer: Self-pay | Admitting: Surgical

## 2023-08-26 MED ORDER — TRAMADOL HCL 50 MG PO TABS: 50.0000 mg | ORAL_TABLET | Freq: Three times a day (TID) | ORAL | 0 refills | Status: DC | PRN

## 2023-08-26 NOTE — Telephone Encounter (Signed)
 Sent in

## 2023-08-26 NOTE — Telephone Encounter (Signed)
 Rx refill Tramadol        [facility]

## 2023-09-19 ENCOUNTER — Ambulatory Visit: Admitting: Orthopedic Surgery

## 2023-09-19 ENCOUNTER — Encounter: Payer: Self-pay | Admitting: Orthopedic Surgery

## 2023-09-19 DIAGNOSIS — M19011 Primary osteoarthritis, right shoulder: Secondary | ICD-10-CM | POA: Diagnosis not present

## 2023-09-19 NOTE — Progress Notes (Signed)
 Office Visit Note   Patient: Raven [name]           Date of Birth: 1960/04/03           MRN: [id number] Visit Date: 09/19/2023 Requested by: Duwaine Annabella SAILOR, FNP [address] [place],  [place] [address] PCP: Duwaine Annabella SAILOR, FNP  Subjective: Chief Complaint  Patient presents with   Other    Scan review     HPI: SHANEA KARNEY is a 63 y.o. female who presents to the office reporting significant right shoulder pain.  Since she was last seen she had a CT scan of the right shoulder which was reviewed with the patient today.  Does have severe glenohumeral arthritis as well as some mild AC joint arthritis.  Taking tramadol  for symptoms.  She has a home health aide that helps her 3 hours a day particularly at night.  Otherwise no one at home.  She does have diabetes with hemoglobin A1c less than 8.  Currently not working.  Has been in pain management in the past but is not currently taking oxycodone ..                ROS: All systems reviewed are negative as they relate to the chief complaint within the history of present illness.  Patient denies fevers or chills.  Assessment & Plan: Visit Diagnoses: No diagnosis found.  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  We had a [place] talk today about the risk and benefits of shoulder replacement.  They include but are not limited to infection or vessel damage incomplete pain relief as well as incomplete functional restoration.  I think for her the replacement would be a predictable pain relieving operation.  Should also increase her range of motion some but not to what she has on the left-hand side.  She is concerned because she does not have anyone at home.  I think she would likely be a 2-day hospital stay.  The rehab is discussed.  She would be in the sling for a couple of weeks and then could come out of the sling but no real lifting with that arm more than a pound or 2 for the first 6  weeks.  Patient understands risk and benefits and wants to consider her options over the weekend.  Debbie's card provided.  She will let us  know if she wants to proceed.  Follow-Up Instructions: No follow-ups on file.   Orders:  No orders of the defined types were placed in this encounter.  No orders of the defined types were placed in this encounter.     Procedures: No procedures performed   Clinical Data: No additional findings.  Objective: Vital Signs: LMP  (LMP Unknown)   Physical Exam:  Constitutional: Patient appears well-developed HEENT:  Head: Normocephalic Eyes:EOM are normal Neck: Normal range of motion Cardiovascular: Normal rate Pulmonary/chest: Effort normal Neurologic: Patient is alert Skin: Skin is warm Psychiatric: Patient has normal mood and affect  Ortho Exam: Ortho exam demonstrates right shoulder range of motion is 25/80/95.  She has 5- out of 5 external rotation strength on the right compared to the left.  Motor or sensory function in the hands intact.  Cervical spine range of motion is full.  Deltoid functions.  Specialty Comments:  No specialty comments available.  Imaging: No results found.   PMFS History: Patient Active Problem List   Diagnosis Date Noted   Urinary  incontinence, mixed 08/15/2023   BMI 32.0-32.9,adult 08/15/2023   Nocturia 08/15/2023   Constipation 08/15/2023   Tobacco use 08/15/2023   Need for pneumococcal vaccine 10/07/2021   Screen for colon cancer 10/07/2021   Need for prophylactic vaccination with combined diphtheria-tetanus-pertussis (DTP) vaccine 10/07/2021   Need for prophylactic vaccination and inoculation against varicella 10/07/2021   Encounter for general adult medical examination with abnormal findings 10/07/2021   Diabetic nephropathy associated with type 2 diabetes mellitus (HCC) 05/31/2021   Type II diabetes mellitus with manifestations (HCC) 05/31/2021   Cervical cancer screening 05/30/2021   Visit for  screening mammogram 05/30/2021   Abnormal electrocardiogram (ECG) (EKG) 05/30/2021   Chronic bilateral low back pain without sciatica 01/18/2021   Thiamine  deficiency neuropathy 04/10/2020   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   Severe episode of recurrent major depressive disorder, with psychotic features (HCC) 02/24/2020   Hyperlipidemia with target LDL less than 130 02/23/2020   Primary hypertension 02/23/2020   Cocaine  abuse (HCC) 02/23/2020   Glaucoma of both eyes 02/23/2020   Stage 3a chronic kidney disease (HCC) 02/23/2020   Primary osteoarthritis of left knee 06/10/2016   Gastroesophageal reflux disease without esophagitis 12/15/2015   Urothelial carcinoma (HCC) 05/04/2015   Severe recurrent major depressive disorder with psychotic features (HCC) 10/13/2014   Primary insomnia 08/17/2014   Anxiety and depression 05/13/2014   Open-angle glaucoma 05/13/2014   Diabetes mellitus, type 2 (HCC) 03/03/2014   Hypothyroidism 03/03/2014   Past Medical History:  Diagnosis Date   Anxiety    Arthritis    all joints and thumbs   Bladder tumor    Cataract    Glaucoma of both eyes    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   Hyperlipidemia    OSA (obstructive sleep apnea)    per pt study 2008  no cpap   Ovarian cancer (HCC)    Post-surgical hypothyroidism    Severe recurrent major depression with psychotic features (HCC)    followed by psychologist--  dr elna lo   Sleep apnea    Type 2 diabetes mellitus (HCC)    Wears dentures    upper   Wears glasses     Family History  Problem Relation Age of Onset   Diabetes Mother    Stroke Mother    Heart disease Mother    Alcohol abuse Father    Colon cancer Father    Cancer Sister    Alcohol abuse Sister    Alcohol abuse Brother    Drug abuse Brother    Alcohol abuse Brother    Drug abuse Brother    Esophageal cancer Neg Hx    Colon polyps Neg Hx    Rectal cancer Neg Hx    Stomach  cancer Neg Hx    Uterine cancer Neg Hx    Bladder Cancer Neg Hx    Renal cancer Neg Hx     Past Surgical History:  Procedure Laterality Date   COLONOSCOPY  Nov 2016   STRABISMUS SURGERY Bilateral age 7   THYROIDECTOMY  23   TRANSURETHRAL RESECTION OF BLADDER TUMOR N/A 04/28/2015   Procedure: TRANSURETHRAL RESECTION OF BLADDER TUMOR (TURBT) ;  Surgeon: Garnette Shack, MD;  Location: [facility];  Service: Urology;  Laterality: N/A;   Social History   Occupational History   Not on file  Tobacco Use   Smoking status: Every Day    Current packs/day: 0.35  Average packs/day: 0.4 packs/day for 13.0 years (4.6 ttl pk-yrs)    Types: Cigarettes    Passive exposure: Past   Smokeless tobacco: Never  Vaping Use   Vaping status: Never Used  Substance and Sexual Activity   Alcohol use: Not Currently    Comment: former   Drug use: Not Currently    Comment: Former   Sexual activity: Never    Partners: Male

## 2023-10-11 ENCOUNTER — Other Ambulatory Visit: Payer: Self-pay | Admitting: Surgical

## 2023-11-20 ENCOUNTER — Other Ambulatory Visit (HOSPITAL_COMMUNITY)
Admission: RE | Admit: 2023-11-20 | Discharge: 2023-11-20 | Disposition: A | Discharge status: Home / Self Care | Source: Ambulatory Visit | Attending: Obstetrics | Admitting: Obstetrics

## 2023-11-20 ENCOUNTER — Other Ambulatory Visit: Payer: Self-pay | Admitting: Obstetrics

## 2023-11-20 ENCOUNTER — Encounter: Payer: Self-pay | Admitting: Obstetrics

## 2023-11-20 ENCOUNTER — Other Ambulatory Visit (HOSPITAL_COMMUNITY)
Admission: RE | Admit: 2023-11-20 | Discharge: 2023-11-20 | Disposition: A | Source: Ambulatory Visit | Attending: Obstetrics | Admitting: Obstetrics

## 2023-11-20 ENCOUNTER — Ambulatory Visit (INDEPENDENT_AMBULATORY_CARE_PROVIDER_SITE_OTHER): Admitting: Obstetrics

## 2023-11-20 ENCOUNTER — Telehealth: Payer: Self-pay | Admitting: Orthopedic Surgery

## 2023-11-20 VITALS — BP 117/68 | HR 108

## 2023-11-20 DIAGNOSIS — N898 Other specified noninflammatory disorders of vagina: Secondary | ICD-10-CM | POA: Diagnosis not present

## 2023-11-20 DIAGNOSIS — N766 Ulceration of vulva: Secondary | ICD-10-CM

## 2023-11-20 DIAGNOSIS — K5900 Constipation, unspecified: Secondary | ICD-10-CM | POA: Diagnosis not present

## 2023-11-20 DIAGNOSIS — N3946 Mixed incontinence: Secondary | ICD-10-CM

## 2023-11-20 DIAGNOSIS — Z720 Tobacco use: Secondary | ICD-10-CM

## 2023-11-20 MED ORDER — TROSPIUM CHLORIDE ER 60 MG PO CP24: 1.0000 | ORAL_CAPSULE | Freq: Every day | ORAL | 0 refills | Status: DC

## 2023-11-20 MED ORDER — VALACYCLOVIR HCL 500 MG PO TABS: 1000.0000 mg | ORAL_TABLET | Freq: Two times a day (BID) | ORAL | 0 refills | Status: DC

## 2023-11-20 NOTE — Assessment & Plan Note (Signed)
-   For constipation, we reviewed the importance of a better bowel regimen.  We also discussed the importance of avoiding chronic straining, as it can exacerbate her pelvic floor symptoms; we discussed treating constipation and straining prior to surgery, as postoperative straining can lead to damage to the repair and recurrence of symptoms. We discussed initiating therapy with increasing fluid intake, fiber supplementation, stool softeners, and laxatives such as miralax.  - reports exacerbation from Gemtesa  and cutting out juices, denies improvement after discontinuation of Gemtesa  - encouraged titration of fiber supplementation to reduce PRN laxative use - advised not to start anti-cholinergic with trospium  until symptoms improve

## 2023-11-20 NOTE — Patient Instructions (Addendum)
 Constipation: Our goal is to achieve formed bowel movements daily or every-other-day.  You may need to try different combinations of the following options to find what works best for you - everybody's body works differently so feel free to adjust the dosages as needed.  Some options to help maintain bowel health include:  Dietary changes (more leafy greens, vegetables and fruits; less processed foods) Fiber supplementation (Benefiber, FiberCon, Metamucil or Psyllium). Start slow and increase gradually to full dose. Over-the-counter agents such as: stool softeners (Docusate or Colace) and/or laxatives (Miralax, milk of magnesia)  Power Pudding is a natural mixture that may help your constipation.  To make blend 1 cup applesauce, 1 cup wheat bran, and 3/4 cup prune juice, refrigerate and then take 1 tablespoon daily with a large glass of water as needed.   Women should try to eat at least 21 to 25 grams of fiber a day, while men should aim for 30 to 38 grams a day. You can add fiber to your diet with food or a fiber supplement such as psyllium (metamucil), benefiber, or fibercon.   Here's a look at how much dietary fiber is found in some common foods. When buying packaged foods, check the Nutrition Facts label for fiber content. It can vary among brands.  Fruits Serving size Total fiber (grams)*  Raspberries 1 cup 8.0  Pear 1 medium 5.5  Apple, with skin 1 medium 4.5  Banana 1 medium 3.0  Orange 1 medium 3.0  Strawberries 1 cup 3.0   Vegetables Serving size Total fiber (grams)*  Green peas, boiled 1 cup 9.0  Broccoli, boiled 1 cup chopped 5.0  Turnip greens, boiled 1 cup 5.0  Brussels sprouts, boiled 1 cup 4.0  Potato, with skin, baked 1 medium 4.0  Sweet corn, boiled 1 cup 3.5  Cauliflower, raw 1 cup chopped 2.0  Carrot, raw 1 medium 1.5   Grains Serving size Total fiber (grams)*  Spaghetti, whole-wheat, cooked 1 cup 6.0  Barley, pearled, cooked 1 cup 6.0  Bran flakes 3/4 cup 5.5   Quinoa, cooked 1 cup 5.0  Oat bran muffin 1 medium 5.0  Oatmeal, instant, cooked 1 cup 5.0  Popcorn, air-popped 3 cups 3.5  Brown rice, cooked 1 cup 3.5  Bread, whole-wheat 1 slice 2.0  Bread, rye 1 slice 2.0   Legumes, nuts and seeds Serving size Total fiber (grams)*  Split peas, boiled 1 cup 16.0  Lentils, boiled 1 cup 15.5  Black beans, boiled 1 cup 15.0  Baked beans, canned 1 cup 10.0  Chia seeds 1 ounce 10.0  Almonds 1 ounce (23 nuts) 3.5  Pistachios 1 ounce (49 nuts) 3.0  Sunflower kernels 1 ounce 3.0  *Rounded to nearest 0.5 gram. Source: [employer] for [employer], Legacy Release     Once your bowel symptoms improve then consider starting Trospium   We discussed the symptoms of overactive bladder (OAB), which include urinary urgency, urinary frequency, night-time urination, with or without urge incontinence.  We discussed management including behavioral therapy (decreasing bladder irritants by following a bladder diet, urge suppression strategies, timed voids, bladder retraining), physical therapy, medication; and for refractory cases posterior tibial nerve stimulation, sacral neuromodulation, and intravesical botulinum toxin injection.   For anticholinergic medications, we discussed the potential side effects of anticholinergics including dry eyes, dry mouth, constipation, rare risks of cognitive impairment and urinary retention. You were given prescription for Trospium .  It can take a month to start working so give it time, but if  you have bothersome side effects call sooner and we can try a different medication.  Call us  if you have trouble filling the prescription or if it's not covered by your insurance.  Please visit the website below to sign up for an account. We will need an email address to send along with your prescription to verify your prescription once you have signed up.  [email or web address]  Trospium  Chloride  ER Capsule Extended Release  60mg   30 count $31.11  Or    Trospium  Chloride (2 times a day dosing) Tablet  20mg   60 count  $14.03  - discussed proper vulvar care, warm compression, avoid pad use, cotton only underwear and barrier ointment if needed  - encouraged Vit E suppository, moisturizer with Replens/Revaree  Start Valtrex  1000mg  (2 tab) by mouth twice a day for 10 days.

## 2023-11-20 NOTE — Progress Notes (Signed)
 [name] Return Visit  SUBJECTIVE  History of Present Illness: Raven [name] is a 63 y.o. female seen in follow-up for mixed urinary incontinence, history of low grade urothelial carcinoma, tobacco use, nocturia, and constipation. Plan at last visit was trial of Gemtesa , fluid managemnet, caffeine reduction, kegel exercises, weight reduction, tobacco cessation, follow-up with Dr. Devere, fiber supplemenetation.   Stopped gemtesa  1 month ago due to feeling drained and depressed with no energy. Also started metformin  around the same time. Denies change in mood symptoms or reduction of constipation since discontinuation of Gemtesa .  UUI and nocturia resolved with Gemtesa  Cutout juice since last visit Did not start miralax Tobacco use with 1 pack /week Pending appt with Dr. Devere 12/2023 Reports bilateral vulvar irritation attributed to scratching with her nails for 1 week  Past Medical History: Patient  has a past medical history of Anxiety, Arthritis, Bladder tumor, Cataract, Glaucoma of both eyes, History of drug abuse in remission (HCC), Hyperlipidemia, OSA (obstructive sleep apnea), Ovarian cancer (HCC), Post-surgical hypothyroidism, Severe recurrent major depression with psychotic features (HCC), Sleep apnea, Type 2 diabetes mellitus (HCC), Wears dentures, and Wears glasses.   Past Surgical History: She  has a past surgical history that includes Thyroidectomy (2004); Strabismus surgery (Bilateral, age 18); Colonoscopy (Nov 2016); and Transurethral resection of bladder tumor (N/A, 04/28/2015).   Medications: She has a current medication list which includes the following prescription(s): aleve , amlodipine , aspirin  ec, atorvastatin , bimatoprost , bupropion , cyclobenzaprine , diclofenac sodium, dorzolamide-timolol, latanoprost, levothyroxine , lidocaine , metformin , methocarbamol , methocarbamol , prednisone , sertraline, thiamine , tramadol , trazodone , trospium  chloride, and  valacyclovir , and the following Facility-Administered Medications: bisacodyl .   Allergies: Patient has no known allergies.   Social History: Patient  reports that she has been smoking cigarettes. She has a 4.6 pack-year smoking history. She has been exposed to tobacco smoke. She has never used smokeless tobacco. She reports that she does not currently use alcohol. She reports that she does not currently use drugs.     OBJECTIVE     Physical Exam: Vitals:   11/20/23 1405  BP: 117/68  Pulse: (!) 108   Physical Exam Constitutional:      General: She is not in acute distress.    Appearance: Normal appearance.  Genitourinary:     Genitourinary Comments: 3 shallow ulcerations approximately 1cm, tender to palpation. No bleeding or purulent discharge     Right Labia: tenderness, lesions and skin changes.     Right Labia: No rash or Bartholin's cyst.    Left Labia: tenderness, lesions and skin changes.     Left Labia: No Bartholin's cyst or rash.    Cardiovascular:     Rate and Rhythm: Tachycardia present.  Pulmonary:     Effort: Pulmonary effort is normal. No respiratory distress.  Neurological:     Mental Status: She is alert.  Vitals reviewed. Exam conducted with a chaperone present.       ASSESSMENT AND PLAN    Raven [name] is a 63 y.o. with:  1. Urinary incontinence, mixed   2. Vaginal irritation   3. Genital labial ulcer   4. Constipation, unspecified constipation type   5. Tobacco use     Urinary incontinence, mixed Assessment & Plan: - urgency > stress - 08/15/23 POCT UA + bilirubin, PVR 17mL - We discussed the symptoms of overactive bladder (OAB), which include urinary urgency, urinary frequency, nocturia, with or without urge incontinence.  While we do not know the exact etiology of OAB, several treatment options exist. We discussed management  including behavioral therapy (decreasing bladder irritants, urge suppression strategies, timed voids, bladder retraining),  physical therapy, medication; for refractory cases posterior tibial nerve stimulation, sacral neuromodulation, and intravesical botulinum toxin injection.  For anticholinergic medications, we discussed the potential side effects of anticholinergics including dry eyes, dry mouth, constipation, cognitive impairment and urinary retention. For Beta-3 agonist medication, we discussed the potential side effect of elevated blood pressure which is more likely to occur in individuals with uncontrolled hypertension. - Gemtesa  samples with resolution of symptoms, stopped due to constipation and mood changes. Not covered by insurance.  - history of open angle glaucoma. Discussed Trospium  Rx after resolution of bowel symptoms with fiber supplementation or titration of miralax. Patient to stop and seek care immediately if eye pain or visual changes, history of open angle glaucoma. - encouraged fluid management, Caffeine reduction, and Kegel exercises - encouraged weight reduction and optimization of stool consistency - For treatment of stress urinary incontinence,  non-surgical options include expectant management, weight loss, physical therapy, as well as a pessary.  Surgical options include a midurethral sling, Burch urethropexy, and transurethral injection of a bulking agent.  Orders: -     Trospium  Chloride ER; Take 1 capsule (60 mg total) by mouth daily. Once your constipation symptoms resolve  Dispense: 30 capsule; Refill: 0  Vaginal irritation Assessment & Plan: - denies vaginal discharge or bleeding - pending Nuswab to r/o infectious etiology  Orders: -     Cervicovaginal ancillary only  Genital labial ulcer Assessment & Plan: - denies h/o HSV, STI. Reports remote sexual activity - reports itching for 1 week with 3 areas of painful ulceration on exam - Rx valtrex  for presumed HSV, discontinue if testing is negative. However discussed risk of false negative testing - pending HSV testing from vulvar  swab - advised to avoid direct contact with ulceration - discussed proper vulvar care, warm compression, avoid pad use, cotton only underwear and barrier ointment if needed    Orders: -     Cytology - PAP  Constipation, unspecified constipation type Assessment & Plan: - For constipation, we reviewed the importance of a better bowel regimen.  We also discussed the importance of avoiding chronic straining, as it can exacerbate her pelvic floor symptoms; we discussed treating constipation and straining prior to surgery, as postoperative straining can lead to damage to the repair and recurrence of symptoms. We discussed initiating therapy with increasing fluid intake, fiber supplementation, stool softeners, and laxatives such as miralax.  - reports exacerbation from Gemtesa  and cutting out juices, denies improvement after discontinuation of Gemtesa  - encouraged titration of fiber supplementation to reduce PRN laxative use - advised not to start anti-cholinergic with trospium  until symptoms improve   Tobacco use Assessment & Plan: - reduced to 1pack/week - encouraged tobacco cessation due to history of bladder tumor - discussed increased risk of malignancy and stress urinary leakage   Other orders -     valACYclovir  HCl; Take 2 tablets (1,000 mg total) by mouth 2 (two) times daily for 10 days.  Dispense: 40 tablet; Refill: 0  Time spent: I spent 26 minutes dedicated to the care of this patient on the date of this encounter to include pre-visit review of records, face-to-face time with the patient discussing mixed urinary incontinence, tobacco use, nocturia, constipation, and post visit documentation and ordering medication/ testing.    Lianne ONEIDA Gillis, MD

## 2023-11-20 NOTE — Assessment & Plan Note (Signed)
-   urgency > stress - 08/15/23 POCT UA + bilirubin, PVR 17mL - We discussed the symptoms of overactive bladder (OAB), which include urinary urgency, urinary frequency, nocturia, with or without urge incontinence.  While we do not know the exact etiology of OAB, several treatment options exist. We discussed management including behavioral therapy (decreasing bladder irritants, urge suppression strategies, timed voids, bladder retraining), physical therapy, medication; for refractory cases posterior tibial nerve stimulation, sacral neuromodulation, and intravesical botulinum toxin injection.  For anticholinergic medications, we discussed the potential side effects of anticholinergics including dry eyes, dry mouth, constipation, cognitive impairment and urinary retention. For Beta-3 agonist medication, we discussed the potential side effect of elevated blood pressure which is more likely to occur in individuals with uncontrolled hypertension. - Gemtesa  samples with resolution of symptoms, stopped due to constipation and mood changes. Not covered by insurance.  - history of open angle glaucoma. Discussed Trospium  Rx after resolution of bowel symptoms with fiber supplementation or titration of miralax. Patient to stop and seek care immediately if eye pain or visual changes, history of open angle glaucoma. - encouraged fluid management, Caffeine reduction, and Kegel exercises - encouraged weight reduction and optimization of stool consistency - For treatment of stress urinary incontinence,  non-surgical options include expectant management, weight loss, physical therapy, as well as a pessary.  Surgical options include a midurethral sling, Burch urethropexy, and transurethral injection of a bulking agent.

## 2023-11-20 NOTE — Assessment & Plan Note (Signed)
-   reduced to 1pack/week - encouraged tobacco cessation due to history of bladder tumor - discussed increased risk of malignancy and stress urinary leakage

## 2023-11-20 NOTE — Telephone Encounter (Signed)
 Called patient to offer surgery date for right reverse shoulder arthroplasty with Dr. Addie.  Patient states cannot have surgery at this time because she doesn't have anyone that can stay with her or help her out.  She said she cannot go without driving for six weeks because she has nobody else for transportation and she has to go to the doctor appointments.  Patient is requesting a refill on her tramadol  and muscle relaxer be called in to [facility].

## 2023-11-20 NOTE — Assessment & Plan Note (Addendum)
-   denies h/o HSV, STI. Reports remote sexual activity - reports itching for 1 week with 3 areas of painful ulceration on exam - Rx valtrex  for presumed HSV, discontinue if testing is negative. However discussed risk of false negative testing - pending HSV testing from vulvar swab - advised to avoid direct contact with ulceration - discussed proper vulvar care, warm compression, avoid pad use, cotton only underwear and barrier ointment if needed

## 2023-11-20 NOTE — Assessment & Plan Note (Signed)
-   denies vaginal discharge or bleeding - pending Nuswab to r/o infectious etiology

## 2023-11-21 LAB — CERVICOVAGINAL ANCILLARY ONLY
Bacterial Vaginitis (gardnerella): NEGATIVE
Candida Glabrata: NEGATIVE
Candida Vaginitis: NEGATIVE
Comment: NEGATIVE
Comment: NEGATIVE
Comment: NEGATIVE

## 2023-11-21 MED ORDER — VALACYCLOVIR HCL 500 MG PO TABS: 1000.0000 mg | ORAL_TABLET | Freq: Two times a day (BID) | ORAL | 0 refills | Status: AC

## 2023-11-21 MED ORDER — TROSPIUM CHLORIDE ER 60 MG PO CP24: 1.0000 | ORAL_CAPSULE | Freq: Every day | ORAL | 0 refills | Status: DC

## 2023-11-21 NOTE — Addendum Note (Signed)
 Addended by: Shanese Riemenschneider N on: 11/21/2023 09:22 AM   Modules accepted: Orders

## 2023-11-24 ENCOUNTER — Ambulatory Visit: Payer: Self-pay | Admitting: Obstetrics

## 2023-11-24 ENCOUNTER — Other Ambulatory Visit: Payer: Self-pay | Admitting: Surgical

## 2023-11-24 MED ORDER — METHOCARBAMOL 500 MG PO TABS: 500.0000 mg | ORAL_TABLET | Freq: Three times a day (TID) | ORAL | 0 refills | Status: AC | PRN

## 2023-11-24 MED ORDER — TRAMADOL HCL 50 MG PO TABS: 50.0000 mg | ORAL_TABLET | Freq: Two times a day (BID) | ORAL | 0 refills | Status: AC | PRN

## 2023-11-24 NOTE — Telephone Encounter (Signed)
 Lvm advising

## 2023-11-24 NOTE — Telephone Encounter (Signed)
 Refilled medication.

## 2023-11-24 NOTE — Telephone Encounter (Signed)
 Patient returned call, patient notified.

## 2023-11-27 ENCOUNTER — Ambulatory Visit: Payer: Self-pay | Admitting: Obstetrics

## 2023-11-27 LAB — MOLECULAR ANCILLARY ONLY
Comment: NEGATIVE
HSV1: NEGATIVE
HSV2: NEGATIVE

## 2023-11-28 ENCOUNTER — Other Ambulatory Visit: Payer: Self-pay | Admitting: Family

## 2023-11-28 DIAGNOSIS — Z85850 Personal history of malignant neoplasm of thyroid: Secondary | ICD-10-CM

## 2023-11-28 DIAGNOSIS — E039 Hypothyroidism, unspecified: Secondary | ICD-10-CM

## 2023-12-02 ENCOUNTER — Ambulatory Visit
Admission: RE | Admit: 2023-12-02 | Discharge: 2023-12-02 | Disposition: A | Discharge status: Home / Self Care | Source: Ambulatory Visit | Attending: Family | Admitting: Family

## 2023-12-02 DIAGNOSIS — Z85850 Personal history of malignant neoplasm of thyroid: Secondary | ICD-10-CM

## 2023-12-02 DIAGNOSIS — E039 Hypothyroidism, unspecified: Secondary | ICD-10-CM

## 2023-12-13 ENCOUNTER — Other Ambulatory Visit: Payer: Self-pay | Admitting: Obstetrics

## 2023-12-13 DIAGNOSIS — N3946 Mixed incontinence: Secondary | ICD-10-CM

## 2024-02-24 ENCOUNTER — Ambulatory Visit: Admitting: Obstetrics

## 2024-04-21 ENCOUNTER — Ambulatory Visit: Admitting: Obstetrics
